# Patient Record
Sex: Female | Born: 1955 | Race: White | Hispanic: No | Marital: Married | Smoking: Former smoker
Health system: Southern US, Community
[De-identification: ages and names within clinical notes are randomized; demographics above are authoritative.]

## PROBLEM LIST (undated history)

## (undated) DIAGNOSIS — I2699 Other pulmonary embolism without acute cor pulmonale: Secondary | ICD-10-CM

## (undated) DIAGNOSIS — I82409 Acute embolism and thrombosis of unspecified deep veins of unspecified lower extremity: Secondary | ICD-10-CM

## (undated) DIAGNOSIS — D509 Iron deficiency anemia, unspecified: Secondary | ICD-10-CM

## (undated) DIAGNOSIS — I10 Essential (primary) hypertension: Secondary | ICD-10-CM

## (undated) DIAGNOSIS — IMO0001 Reserved for inherently not codable concepts without codable children: Secondary | ICD-10-CM

## (undated) DIAGNOSIS — I7410 Embolism and thrombosis of unspecified parts of aorta: Secondary | ICD-10-CM

## (undated) DIAGNOSIS — D688 Other specified coagulation defects: Secondary | ICD-10-CM

## (undated) DIAGNOSIS — E039 Hypothyroidism, unspecified: Secondary | ICD-10-CM

## (undated) DIAGNOSIS — K219 Gastro-esophageal reflux disease without esophagitis: Secondary | ICD-10-CM

## (undated) HISTORY — PX: CHOLECYSTECTOMY: SHX55

## (undated) HISTORY — PX: ABDOMINAL HYSTERECTOMY: SHX81

## (undated) HISTORY — PX: TOTAL ABDOMINAL HYSTERECTOMY: SHX209

---

## 2001-05-04 HISTORY — PX: BELOW KNEE LEG AMPUTATION: SUR23

## 2002-05-10 ENCOUNTER — Emergency Department (HOSPITAL_COMMUNITY): Admission: EM | Admit: 2002-05-10 | Discharge: 2002-05-10 | Payer: Self-pay | Admitting: Emergency Medicine

## 2002-05-12 ENCOUNTER — Encounter: Payer: Self-pay | Admitting: Vascular Surgery

## 2002-05-12 ENCOUNTER — Inpatient Hospital Stay (HOSPITAL_COMMUNITY): Admission: RE | Admit: 2002-05-12 | Discharge: 2002-05-20 | Payer: Self-pay | Admitting: Vascular Surgery

## 2002-05-12 ENCOUNTER — Encounter (INDEPENDENT_AMBULATORY_CARE_PROVIDER_SITE_OTHER): Payer: Self-pay | Admitting: *Deleted

## 2002-05-13 ENCOUNTER — Encounter: Payer: Self-pay | Admitting: Vascular Surgery

## 2002-05-15 ENCOUNTER — Encounter (INDEPENDENT_AMBULATORY_CARE_PROVIDER_SITE_OTHER): Payer: Self-pay | Admitting: Specialist

## 2002-05-25 ENCOUNTER — Encounter: Payer: Self-pay | Admitting: Vascular Surgery

## 2002-05-25 ENCOUNTER — Inpatient Hospital Stay (HOSPITAL_COMMUNITY): Admission: RE | Admit: 2002-05-25 | Discharge: 2002-06-06 | Payer: Self-pay | Admitting: Vascular Surgery

## 2002-05-31 ENCOUNTER — Encounter (INDEPENDENT_AMBULATORY_CARE_PROVIDER_SITE_OTHER): Payer: Self-pay | Admitting: *Deleted

## 2002-09-26 ENCOUNTER — Inpatient Hospital Stay (HOSPITAL_COMMUNITY): Admission: EM | Admit: 2002-09-26 | Discharge: 2002-09-30 | Payer: Self-pay | Admitting: Emergency Medicine

## 2002-09-26 ENCOUNTER — Encounter: Payer: Self-pay | Admitting: Emergency Medicine

## 2002-10-02 ENCOUNTER — Encounter: Admission: RE | Admit: 2002-10-02 | Discharge: 2002-10-02 | Payer: Self-pay | Admitting: Internal Medicine

## 2002-10-05 ENCOUNTER — Encounter: Admission: RE | Admit: 2002-10-05 | Discharge: 2002-10-05 | Payer: Self-pay | Admitting: Internal Medicine

## 2002-10-09 ENCOUNTER — Encounter: Admission: RE | Admit: 2002-10-09 | Discharge: 2002-10-09 | Payer: Self-pay | Admitting: Internal Medicine

## 2002-10-18 ENCOUNTER — Encounter: Admission: RE | Admit: 2002-10-18 | Discharge: 2003-01-16 | Payer: Self-pay | Admitting: Vascular Surgery

## 2003-01-17 ENCOUNTER — Encounter: Admission: RE | Admit: 2003-01-17 | Discharge: 2003-03-12 | Payer: Self-pay | Admitting: Vascular Surgery

## 2003-02-06 ENCOUNTER — Encounter
Admission: RE | Admit: 2003-02-06 | Discharge: 2003-05-07 | Payer: Self-pay | Admitting: Physical Medicine & Rehabilitation

## 2003-05-23 ENCOUNTER — Encounter
Admission: RE | Admit: 2003-05-23 | Discharge: 2003-08-21 | Payer: Self-pay | Admitting: Physical Medicine & Rehabilitation

## 2003-06-26 ENCOUNTER — Encounter
Admission: RE | Admit: 2003-06-26 | Discharge: 2003-09-24 | Payer: Self-pay | Admitting: Physical Medicine & Rehabilitation

## 2003-08-13 ENCOUNTER — Other Ambulatory Visit: Admission: RE | Admit: 2003-08-13 | Discharge: 2003-08-13 | Payer: Self-pay | Admitting: Gynecology

## 2003-08-22 ENCOUNTER — Encounter
Admission: RE | Admit: 2003-08-22 | Discharge: 2003-11-20 | Payer: Self-pay | Admitting: Physical Medicine & Rehabilitation

## 2003-11-15 ENCOUNTER — Encounter
Admission: RE | Admit: 2003-11-15 | Discharge: 2004-02-13 | Payer: Self-pay | Admitting: Physical Medicine & Rehabilitation

## 2004-01-18 ENCOUNTER — Ambulatory Visit (HOSPITAL_COMMUNITY): Admission: RE | Admit: 2004-01-18 | Discharge: 2004-01-18 | Payer: Self-pay | Admitting: Oncology

## 2004-03-13 ENCOUNTER — Ambulatory Visit: Payer: Self-pay | Admitting: Oncology

## 2004-04-24 ENCOUNTER — Encounter
Admission: RE | Admit: 2004-04-24 | Discharge: 2004-07-23 | Payer: Self-pay | Admitting: Physical Medicine & Rehabilitation

## 2004-05-08 ENCOUNTER — Ambulatory Visit: Payer: Self-pay | Admitting: Oncology

## 2004-06-11 ENCOUNTER — Ambulatory Visit: Payer: Self-pay | Admitting: Physical Medicine & Rehabilitation

## 2004-06-26 ENCOUNTER — Ambulatory Visit: Payer: Self-pay | Admitting: Oncology

## 2004-07-03 ENCOUNTER — Other Ambulatory Visit: Admission: RE | Admit: 2004-07-03 | Discharge: 2004-07-03 | Payer: Self-pay | Admitting: Gynecology

## 2004-07-16 ENCOUNTER — Ambulatory Visit (HOSPITAL_COMMUNITY): Admission: RE | Admit: 2004-07-16 | Discharge: 2004-07-16 | Payer: Self-pay | Admitting: Gynecology

## 2004-08-14 ENCOUNTER — Ambulatory Visit: Payer: Self-pay | Admitting: Oncology

## 2004-08-18 ENCOUNTER — Ambulatory Visit: Payer: Self-pay | Admitting: Oncology

## 2004-08-19 ENCOUNTER — Ambulatory Visit (HOSPITAL_COMMUNITY): Admission: RE | Admit: 2004-08-19 | Discharge: 2004-08-20 | Payer: Self-pay | Admitting: *Deleted

## 2004-09-01 ENCOUNTER — Ambulatory Visit: Payer: Self-pay | Admitting: Oncology

## 2004-10-22 ENCOUNTER — Ambulatory Visit: Payer: Self-pay | Admitting: Oncology

## 2004-10-31 ENCOUNTER — Ambulatory Visit: Payer: Self-pay | Admitting: Physical Medicine & Rehabilitation

## 2004-10-31 ENCOUNTER — Encounter
Admission: RE | Admit: 2004-10-31 | Discharge: 2005-01-29 | Payer: Self-pay | Admitting: Physical Medicine & Rehabilitation

## 2004-12-08 ENCOUNTER — Ambulatory Visit: Payer: Self-pay | Admitting: Oncology

## 2005-01-26 ENCOUNTER — Ambulatory Visit: Payer: Self-pay | Admitting: Oncology

## 2005-03-03 ENCOUNTER — Ambulatory Visit: Payer: Self-pay | Admitting: Physical Medicine & Rehabilitation

## 2005-03-03 ENCOUNTER — Encounter
Admission: RE | Admit: 2005-03-03 | Discharge: 2005-06-01 | Payer: Self-pay | Admitting: Physical Medicine & Rehabilitation

## 2005-03-12 ENCOUNTER — Ambulatory Visit: Payer: Self-pay | Admitting: Oncology

## 2005-05-01 ENCOUNTER — Ambulatory Visit: Payer: Self-pay | Admitting: Oncology

## 2005-06-12 ENCOUNTER — Ambulatory Visit: Payer: Self-pay | Admitting: Oncology

## 2005-06-30 ENCOUNTER — Ambulatory Visit: Payer: Self-pay | Admitting: Physical Medicine & Rehabilitation

## 2005-06-30 ENCOUNTER — Encounter
Admission: RE | Admit: 2005-06-30 | Discharge: 2005-09-28 | Payer: Self-pay | Admitting: Physical Medicine & Rehabilitation

## 2005-08-07 ENCOUNTER — Ambulatory Visit: Payer: Self-pay | Admitting: Oncology

## 2005-09-14 LAB — PROTIME-INR (CHCC SATELLITE)
INR: 3 (ref 2.0–3.5)
Protime: 20.8 Seconds — ABNORMAL HIGH (ref 10.6–13.4)

## 2005-10-13 ENCOUNTER — Ambulatory Visit: Payer: Self-pay | Admitting: Gastroenterology

## 2005-10-14 ENCOUNTER — Ambulatory Visit: Payer: Self-pay | Admitting: Oncology

## 2005-10-15 LAB — CBC WITH DIFFERENTIAL (CANCER CENTER ONLY)
Eosinophils Absolute: 0.2 10*3/uL (ref 0.0–0.5)
HCT: 45.3 % (ref 34.8–46.6)
LYMPH#: 2.1 10*3/uL (ref 0.9–3.3)
LYMPH%: 35.4 % (ref 14.0–48.0)
MCV: 98 fL (ref 81–101)
MONO#: 0.3 10*3/uL (ref 0.1–0.9)
Platelets: 223 10*3/uL (ref 145–400)
RBC: 4.62 10*6/uL (ref 3.70–5.32)
WBC: 5.8 10*3/uL (ref 3.9–10.0)

## 2005-10-15 LAB — PROTIME-INR (CHCC SATELLITE)

## 2005-10-16 LAB — BASIC METABOLIC PANEL
BUN: 10 mg/dL (ref 6–23)
CO2: 26 mEq/L (ref 19–32)
Calcium: 9.1 mg/dL (ref 8.4–10.5)
Creatinine, Ser: 0.6 mg/dL (ref 0.40–1.20)
Glucose, Bld: 107 mg/dL — ABNORMAL HIGH (ref 70–99)

## 2005-10-16 LAB — IRON AND TIBC: TIBC: 306 ug/dL (ref 250–470)

## 2005-11-03 ENCOUNTER — Ambulatory Visit: Payer: Self-pay | Admitting: Physical Medicine & Rehabilitation

## 2005-11-03 ENCOUNTER — Encounter
Admission: RE | Admit: 2005-11-03 | Discharge: 2006-02-01 | Payer: Self-pay | Admitting: Physical Medicine & Rehabilitation

## 2005-11-11 ENCOUNTER — Ambulatory Visit: Payer: Self-pay | Admitting: Internal Medicine

## 2005-11-18 ENCOUNTER — Other Ambulatory Visit: Payer: Self-pay

## 2005-11-24 ENCOUNTER — Ambulatory Visit: Payer: Self-pay | Admitting: General Surgery

## 2005-12-02 ENCOUNTER — Ambulatory Visit: Payer: Self-pay | Admitting: Internal Medicine

## 2005-12-18 ENCOUNTER — Ambulatory Visit: Payer: Self-pay | Admitting: Oncology

## 2005-12-29 LAB — PROTIME-INR (CHCC SATELLITE)
INR: 1.9 — ABNORMAL LOW (ref 2.0–3.5)
Protime: 17 Seconds — ABNORMAL HIGH (ref 10.6–13.4)

## 2006-01-02 ENCOUNTER — Ambulatory Visit: Payer: Self-pay | Admitting: Internal Medicine

## 2006-01-06 LAB — PROTIME-INR (CHCC SATELLITE)
INR: 2.8 (ref 2.0–3.5)
Protime: 33.6 Seconds — ABNORMAL HIGH (ref 10.6–13.4)

## 2006-01-20 LAB — PROTIME-INR (CHCC SATELLITE)
INR: 2.8 (ref 2.0–3.5)
Protime: 33.6 Seconds — ABNORMAL HIGH (ref 10.6–13.4)

## 2006-02-10 ENCOUNTER — Ambulatory Visit: Payer: Self-pay | Admitting: Oncology

## 2006-03-09 ENCOUNTER — Encounter
Admission: RE | Admit: 2006-03-09 | Discharge: 2006-06-07 | Payer: Self-pay | Admitting: Physical Medicine & Rehabilitation

## 2006-03-12 ENCOUNTER — Ambulatory Visit: Payer: Self-pay | Admitting: Physical Medicine & Rehabilitation

## 2006-04-15 ENCOUNTER — Ambulatory Visit: Payer: Self-pay | Admitting: Oncology

## 2006-04-20 LAB — PROTIME-INR (CHCC SATELLITE): INR: 3 (ref 2.0–3.5)

## 2006-05-18 LAB — PROTIME-INR (CHCC SATELLITE)
INR: 4.4 — ABNORMAL HIGH (ref 2.0–3.5)
Protime: 52.8 Seconds — ABNORMAL HIGH (ref 10.6–13.4)

## 2006-06-07 ENCOUNTER — Ambulatory Visit: Payer: Self-pay | Admitting: Oncology

## 2006-06-08 LAB — PROTIME-INR (CHCC SATELLITE)

## 2006-06-24 LAB — PROTIME-INR (CHCC SATELLITE)
INR: 4 — ABNORMAL HIGH (ref 2.0–3.5)
Protime: 48 Seconds — ABNORMAL HIGH (ref 10.6–13.4)

## 2006-06-24 LAB — CBC WITH DIFFERENTIAL (CANCER CENTER ONLY)
BASO#: 0.1 10*3/uL (ref 0.0–0.2)
BASO%: 0.9 % (ref 0.0–2.0)
Eosinophils Absolute: 0.2 10*3/uL (ref 0.0–0.5)
HCT: 43.5 % (ref 34.8–46.6)
HGB: 14.9 g/dL (ref 11.6–15.9)
LYMPH#: 2 10*3/uL (ref 0.9–3.3)
LYMPH%: 34.8 % (ref 14.0–48.0)
MCV: 98 fL (ref 81–101)
MONO#: 0.3 10*3/uL (ref 0.1–0.9)
NEUT%: 54.6 % (ref 39.6–80.0)
RDW: 11.1 % (ref 10.5–14.6)
WBC: 5.7 10*3/uL (ref 3.9–10.0)

## 2006-06-24 LAB — FERRITIN: Ferritin: 410 ng/mL — ABNORMAL HIGH (ref 10–291)

## 2006-06-24 LAB — IRON AND TIBC
%SAT: 48 % (ref 20–55)
TIBC: 344 ug/dL (ref 250–470)

## 2006-07-01 LAB — PROTIME-INR (CHCC SATELLITE)
INR: 3.3 (ref 2.0–3.5)
Protime: 39.6 Seconds — ABNORMAL HIGH (ref 10.6–13.4)

## 2006-07-08 ENCOUNTER — Ambulatory Visit: Payer: Self-pay | Admitting: Physical Medicine & Rehabilitation

## 2006-07-08 ENCOUNTER — Encounter
Admission: RE | Admit: 2006-07-08 | Discharge: 2006-10-06 | Payer: Self-pay | Admitting: Physical Medicine & Rehabilitation

## 2006-07-15 LAB — PROTIME-INR (CHCC SATELLITE)

## 2006-07-28 ENCOUNTER — Ambulatory Visit: Payer: Self-pay | Admitting: Oncology

## 2006-08-31 LAB — PROTIME-INR (CHCC SATELLITE)
INR: 1.6 — ABNORMAL LOW (ref 2.0–3.5)
Protime: 19.2 Seconds — ABNORMAL HIGH (ref 10.6–13.4)

## 2006-09-08 LAB — PROTIME-INR (CHCC SATELLITE)
INR: 2.2 (ref 2.0–3.5)
Protime: 26.4 Seconds — ABNORMAL HIGH (ref 10.6–13.4)

## 2006-09-22 ENCOUNTER — Ambulatory Visit: Payer: Self-pay | Admitting: Oncology

## 2006-10-07 LAB — PROTIME-INR (CHCC SATELLITE)

## 2006-10-28 LAB — PROTIME-INR (CHCC SATELLITE)
INR: 2.8 (ref 2.0–3.5)
Protime: 33.6 Seconds — ABNORMAL HIGH (ref 10.6–13.4)

## 2006-11-01 ENCOUNTER — Encounter
Admission: RE | Admit: 2006-11-01 | Discharge: 2007-01-30 | Payer: Self-pay | Admitting: Physical Medicine & Rehabilitation

## 2006-11-01 ENCOUNTER — Ambulatory Visit: Payer: Self-pay | Admitting: Physical Medicine & Rehabilitation

## 2006-11-23 ENCOUNTER — Ambulatory Visit: Payer: Self-pay | Admitting: Oncology

## 2006-11-30 LAB — PROTIME-INR (CHCC SATELLITE): Protime: 26.4 Seconds — ABNORMAL HIGH (ref 10.6–13.4)

## 2006-12-23 LAB — CBC WITH DIFFERENTIAL (CANCER CENTER ONLY)
BASO#: 0.1 10*3/uL (ref 0.0–0.2)
BASO%: 0.8 % (ref 0.0–2.0)
EOS%: 5.8 % (ref 0.0–7.0)
HCT: 39.8 % (ref 34.8–46.6)
HGB: 13.4 g/dL (ref 11.6–15.9)
LYMPH%: 31.4 % (ref 14.0–48.0)
MCH: 32.3 pg (ref 26.0–34.0)
MCHC: 33.7 g/dL (ref 32.0–36.0)
MCV: 96 fL (ref 81–101)
MONO%: 4.9 % (ref 0.0–13.0)
NEUT%: 57.1 % (ref 39.6–80.0)
RDW: 10.5 % (ref 10.5–14.6)

## 2006-12-23 LAB — COMPREHENSIVE METABOLIC PANEL
ALT: 21 U/L (ref 0–35)
Albumin: 4 g/dL (ref 3.5–5.2)
CO2: 27 mEq/L (ref 19–32)
Glucose, Bld: 92 mg/dL (ref 70–99)
Potassium: 3.6 mEq/L (ref 3.5–5.3)
Sodium: 133 mEq/L — ABNORMAL LOW (ref 135–145)
Total Bilirubin: 0.6 mg/dL (ref 0.3–1.2)
Total Protein: 6.2 g/dL (ref 6.0–8.3)

## 2007-01-21 ENCOUNTER — Ambulatory Visit: Payer: Self-pay | Admitting: Oncology

## 2007-01-28 LAB — PROTIME-INR (CHCC SATELLITE)
INR: 2.3 (ref 2.0–3.5)
Protime: 27.6 Seconds — ABNORMAL HIGH (ref 10.6–13.4)

## 2007-02-17 LAB — PROTIME-INR (CHCC SATELLITE): INR: 2.4 (ref 2.0–3.5)

## 2007-03-10 ENCOUNTER — Ambulatory Visit: Payer: Self-pay | Admitting: Physical Medicine & Rehabilitation

## 2007-03-10 ENCOUNTER — Encounter
Admission: RE | Admit: 2007-03-10 | Discharge: 2007-03-11 | Payer: Self-pay | Admitting: Physical Medicine & Rehabilitation

## 2007-03-16 ENCOUNTER — Ambulatory Visit: Payer: Self-pay | Admitting: Oncology

## 2007-03-21 LAB — PROTIME-INR (CHCC SATELLITE): INR: 2.7 (ref 2.0–3.5)

## 2007-04-14 LAB — PROTIME-INR (CHCC SATELLITE): Protime: 43.2 Seconds — ABNORMAL HIGH (ref 10.6–13.4)

## 2007-04-21 LAB — PROTIME-INR (CHCC SATELLITE): Protime: 32.4 Seconds — ABNORMAL HIGH (ref 10.6–13.4)

## 2007-05-10 ENCOUNTER — Ambulatory Visit: Payer: Self-pay | Admitting: Oncology

## 2007-05-12 LAB — PROTIME-INR (CHCC SATELLITE)
INR: 2.4 (ref 2.0–3.5)
Protime: 28.8 Seconds — ABNORMAL HIGH (ref 10.6–13.4)

## 2007-06-27 ENCOUNTER — Ambulatory Visit: Payer: Self-pay | Admitting: Oncology

## 2007-06-28 LAB — CBC WITH DIFFERENTIAL (CANCER CENTER ONLY)
BASO%: 0.9 % (ref 0.0–2.0)
EOS%: 4.1 % (ref 0.0–7.0)
HCT: 45.6 % (ref 34.8–46.6)
LYMPH#: 2.5 10*3/uL (ref 0.9–3.3)
LYMPH%: 36.2 % (ref 14.0–48.0)
MCHC: 34 g/dL (ref 32.0–36.0)
MCV: 98 fL (ref 81–101)
NEUT%: 53.6 % (ref 39.6–80.0)
Platelets: 265 10*3/uL (ref 145–400)
RDW: 10.2 % — ABNORMAL LOW (ref 10.5–14.6)

## 2007-06-28 LAB — FERRITIN: Ferritin: 130 ng/mL (ref 10–291)

## 2007-06-28 LAB — IRON AND TIBC: %SAT: 49 % (ref 20–55)

## 2007-06-28 LAB — COMPREHENSIVE METABOLIC PANEL
ALT: 32 U/L (ref 0–35)
AST: 27 U/L (ref 0–37)
Chloride: 101 mEq/L (ref 96–112)
Creatinine, Ser: 0.68 mg/dL (ref 0.40–1.20)
Total Bilirubin: 0.8 mg/dL (ref 0.3–1.2)

## 2007-06-28 LAB — PROTIME-INR (CHCC SATELLITE): INR: 1.9 — ABNORMAL LOW (ref 2.0–3.5)

## 2007-06-30 ENCOUNTER — Ambulatory Visit: Payer: Self-pay | Admitting: Physical Medicine & Rehabilitation

## 2007-06-30 ENCOUNTER — Encounter
Admission: RE | Admit: 2007-06-30 | Discharge: 2007-09-28 | Payer: Self-pay | Admitting: Physical Medicine & Rehabilitation

## 2007-07-12 LAB — PROTIME-INR (CHCC SATELLITE): INR: 2.5 (ref 2.0–3.5)

## 2007-07-12 IMAGING — CR DG CHOLANGIOGRAM OPERATIVE
1 series · 2 of 2 positions shown · non-contrast
Comparison: none

REASON FOR EXAM: Cholangiogram in OR
COMMENTS:

[Series 1: view not recorded · 0.17mm/px · 2 of 2 slices shown]
[im 1/2]
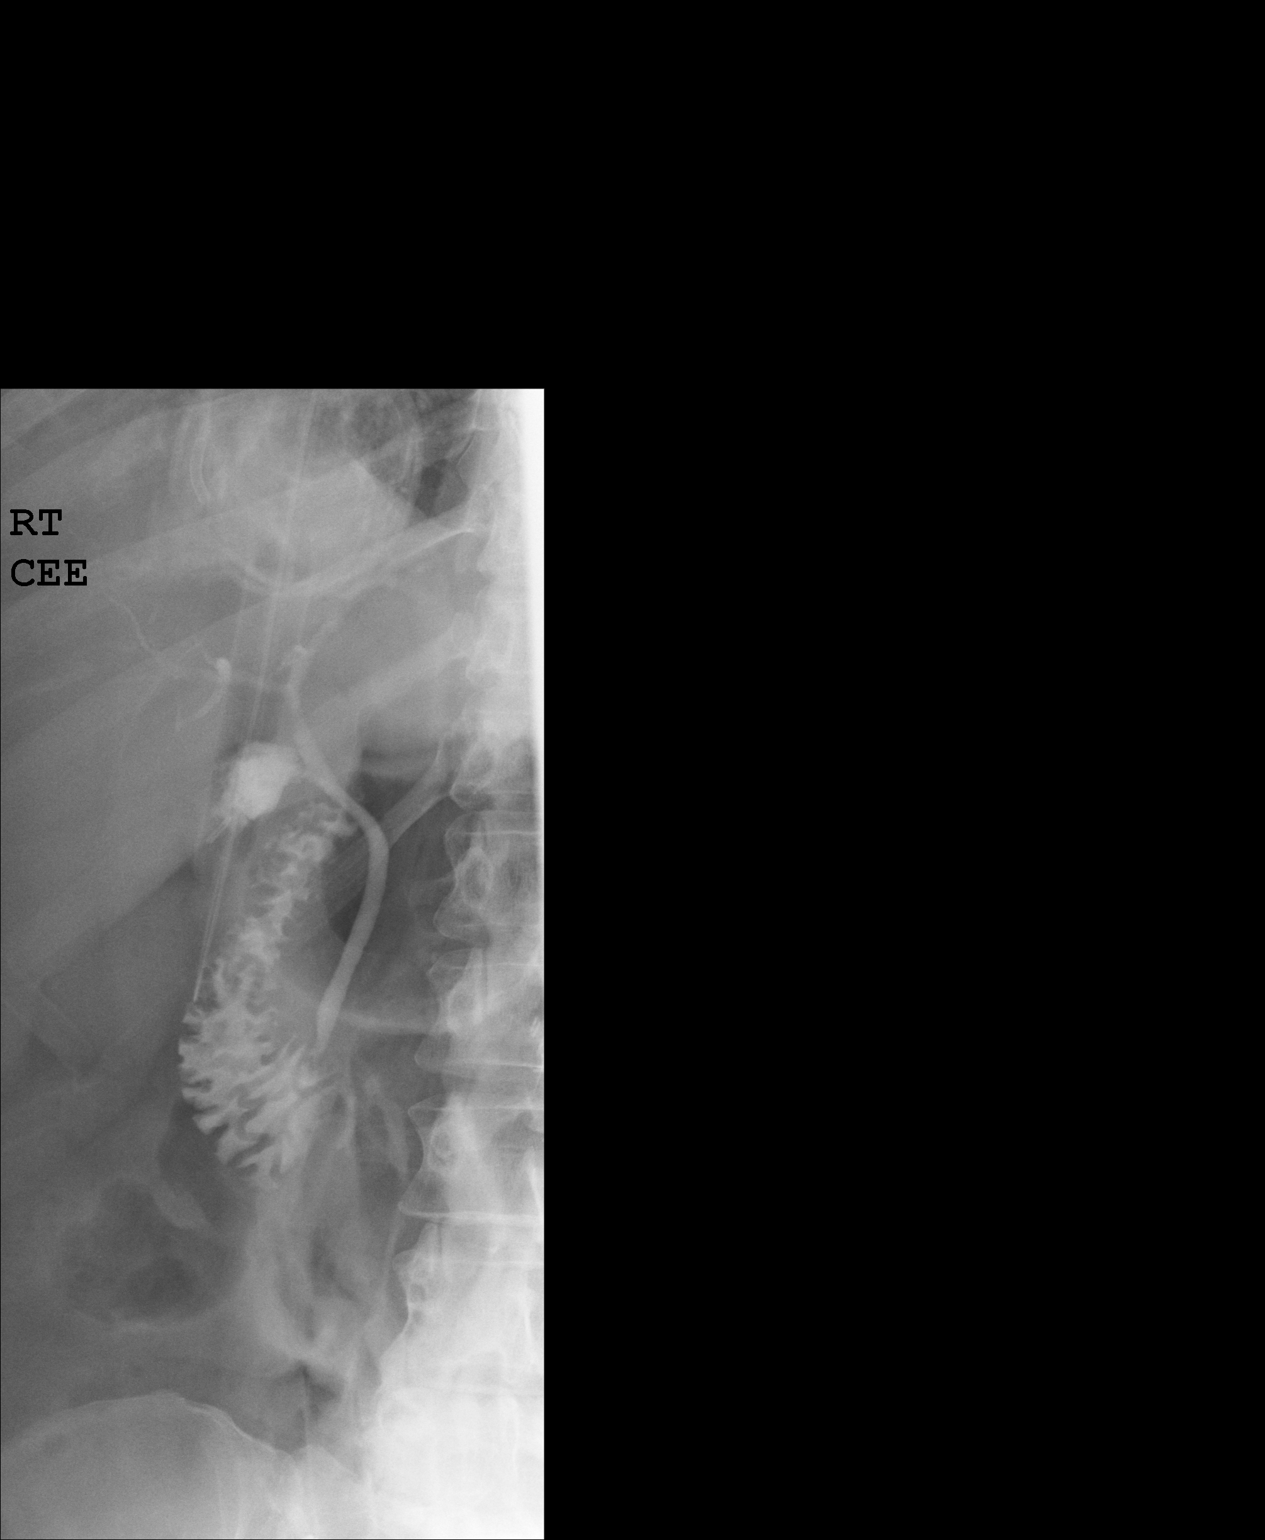
[im 2/2]
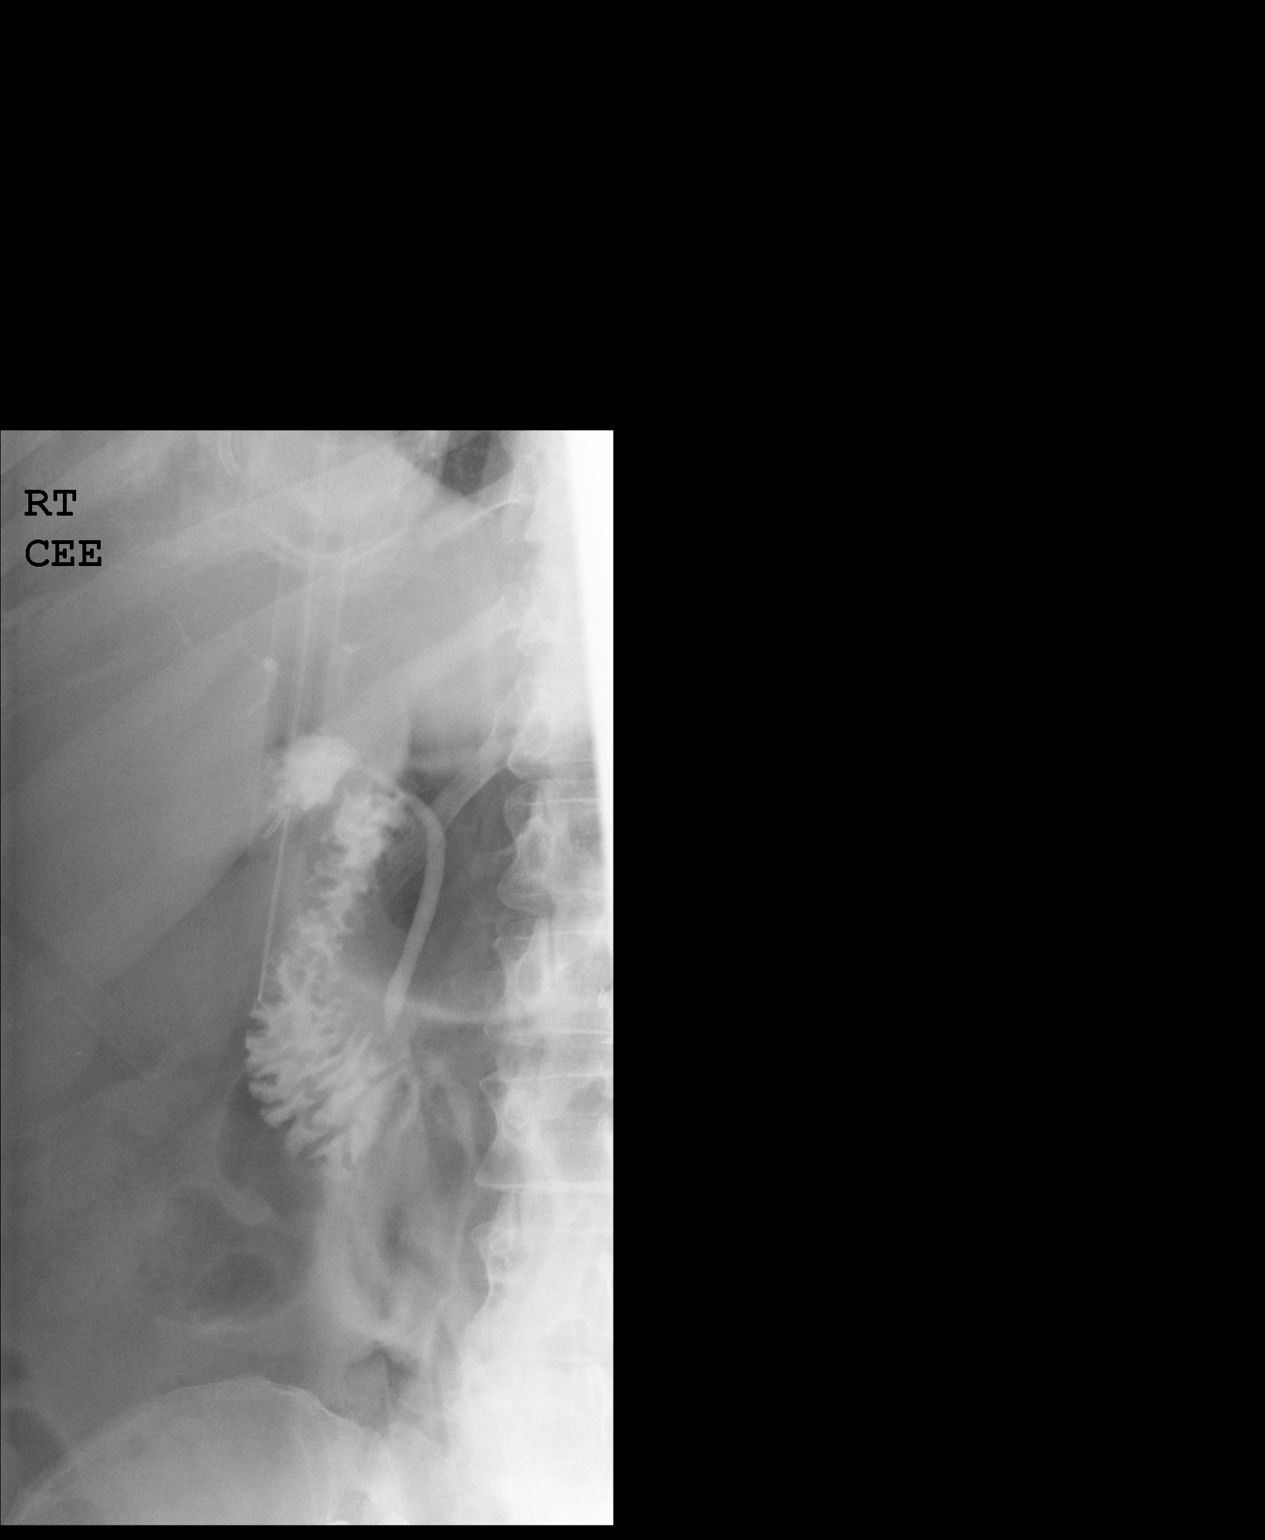

[2 of 2 positions shown; findings below may reference images not displayed]

PROCEDURE:     DXR - DXR CHOLANGIOGRAM OP (INITIAL)  - November 24, 2005 [DATE]

RESULT:     Contrast material was visualized in the hepatic ducts and common
duct.  The common duct is normal in size.  No retained stone is seen.
Contrast is noted to flow into the duodenum without evidence of obstruction.
IMPRESSION: Normal operative cholangiogram.

## 2007-08-09 LAB — PROTIME-INR (CHCC SATELLITE): Protime: 33.6 Seconds — ABNORMAL HIGH (ref 10.6–13.4)

## 2007-08-15 ENCOUNTER — Ambulatory Visit: Payer: Self-pay | Admitting: Gastroenterology

## 2007-08-31 ENCOUNTER — Ambulatory Visit: Payer: Self-pay | Admitting: Physical Medicine & Rehabilitation

## 2007-09-05 ENCOUNTER — Ambulatory Visit: Payer: Self-pay | Admitting: Oncology

## 2007-09-06 LAB — PROTIME-INR (CHCC SATELLITE)
INR: 3.8 — ABNORMAL HIGH (ref 2.0–3.5)
Protime: 45.6 Seconds — ABNORMAL HIGH (ref 10.6–13.4)

## 2007-10-12 LAB — PROTIME-INR (CHCC SATELLITE): Protime: 36 Seconds — ABNORMAL HIGH (ref 10.6–13.4)

## 2007-10-28 ENCOUNTER — Ambulatory Visit: Payer: Self-pay | Admitting: Oncology

## 2007-11-08 LAB — PROTIME-INR (CHCC SATELLITE): INR: 1.5 — ABNORMAL LOW (ref 2.0–3.5)

## 2007-11-09 ENCOUNTER — Encounter
Admission: RE | Admit: 2007-11-09 | Discharge: 2007-11-10 | Payer: Self-pay | Admitting: Physical Medicine & Rehabilitation

## 2007-11-10 ENCOUNTER — Ambulatory Visit: Payer: Self-pay | Admitting: Physical Medicine & Rehabilitation

## 2007-11-22 LAB — PROTIME-INR (CHCC SATELLITE)
INR: 2.1 (ref 2.0–3.5)
Protime: 25.2 Seconds — ABNORMAL HIGH (ref 10.6–13.4)

## 2007-12-26 ENCOUNTER — Ambulatory Visit: Payer: Self-pay | Admitting: Oncology

## 2007-12-27 LAB — CBC WITH DIFFERENTIAL (CANCER CENTER ONLY)
BASO%: 0.6 % (ref 0.0–2.0)
HCT: 39.8 % (ref 34.8–46.6)
LYMPH%: 35.5 % (ref 14.0–48.0)
MCH: 34.3 pg — ABNORMAL HIGH (ref 26.0–34.0)
MCV: 98 fL (ref 81–101)
MONO#: 0.3 10*3/uL (ref 0.1–0.9)
MONO%: 5.3 % (ref 0.0–13.0)
NEUT%: 54 % (ref 39.6–80.0)
RDW: 9.9 % — ABNORMAL LOW (ref 10.5–14.6)
WBC: 6.4 10*3/uL (ref 3.9–10.0)

## 2007-12-27 LAB — IRON AND TIBC: %SAT: 29 % (ref 20–55)

## 2007-12-27 LAB — PROTIME-INR (CHCC SATELLITE)
INR: 2.6 (ref 2.0–3.5)
Protime: 31.2 Seconds — ABNORMAL HIGH (ref 10.6–13.4)

## 2008-01-27 LAB — PROTIME-INR (CHCC SATELLITE): INR: 3.5 (ref 2.0–3.5)

## 2008-02-09 ENCOUNTER — Ambulatory Visit: Payer: Self-pay | Admitting: Oncology

## 2008-02-13 LAB — PROTIME-INR (CHCC SATELLITE): Protime: 28.8 Seconds — ABNORMAL HIGH (ref 10.6–13.4)

## 2008-02-23 ENCOUNTER — Encounter
Admission: RE | Admit: 2008-02-23 | Discharge: 2008-03-09 | Payer: Self-pay | Admitting: Physical Medicine & Rehabilitation

## 2008-03-09 ENCOUNTER — Encounter
Admission: RE | Admit: 2008-03-09 | Discharge: 2008-03-19 | Payer: Self-pay | Admitting: Physical Medicine and Rehabilitation

## 2008-03-19 ENCOUNTER — Ambulatory Visit: Payer: Self-pay | Admitting: Physical Medicine & Rehabilitation

## 2008-03-23 LAB — PROTIME-INR (CHCC SATELLITE): Protime: 31.2 Seconds — ABNORMAL HIGH (ref 10.6–13.4)

## 2008-04-19 ENCOUNTER — Ambulatory Visit: Payer: Self-pay | Admitting: Oncology

## 2008-04-24 LAB — PROTIME-INR (CHCC SATELLITE): INR: 2.7 (ref 2.0–3.5)

## 2008-06-14 ENCOUNTER — Ambulatory Visit: Payer: Self-pay | Admitting: Oncology

## 2008-06-15 LAB — PROTIME-INR (CHCC SATELLITE): INR: 2.5 (ref 2.0–3.5)

## 2008-07-12 ENCOUNTER — Encounter
Admission: RE | Admit: 2008-07-12 | Discharge: 2008-10-10 | Payer: Self-pay | Admitting: Physical Medicine and Rehabilitation

## 2008-07-13 ENCOUNTER — Ambulatory Visit: Payer: Self-pay | Admitting: Physical Medicine and Rehabilitation

## 2008-07-13 LAB — PROTIME-INR (CHCC SATELLITE)
INR: 1.5 — ABNORMAL LOW (ref 2.0–3.5)
Protime: 18 Seconds — ABNORMAL HIGH (ref 10.6–13.4)

## 2008-07-27 ENCOUNTER — Encounter: Payer: Self-pay | Admitting: Gastroenterology

## 2008-07-31 ENCOUNTER — Ambulatory Visit: Payer: Self-pay | Admitting: Oncology

## 2008-07-31 DIAGNOSIS — I743 Embolism and thrombosis of arteries of the lower extremities: Secondary | ICD-10-CM | POA: Insufficient documentation

## 2008-07-31 DIAGNOSIS — F419 Anxiety disorder, unspecified: Secondary | ICD-10-CM | POA: Insufficient documentation

## 2008-07-31 DIAGNOSIS — R894 Abnormal immunological findings in specimens from other organs, systems and tissues: Secondary | ICD-10-CM | POA: Insufficient documentation

## 2008-07-31 DIAGNOSIS — K219 Gastro-esophageal reflux disease without esophagitis: Secondary | ICD-10-CM

## 2008-07-31 DIAGNOSIS — F411 Generalized anxiety disorder: Secondary | ICD-10-CM | POA: Insufficient documentation

## 2008-07-31 DIAGNOSIS — J019 Acute sinusitis, unspecified: Secondary | ICD-10-CM | POA: Insufficient documentation

## 2008-07-31 LAB — PROTIME-INR (CHCC SATELLITE)
INR: 1.3 — ABNORMAL LOW (ref 2.0–3.5)
Protime: 15.6 Seconds — ABNORMAL HIGH (ref 10.6–13.4)

## 2008-08-07 LAB — PROTIME-INR (CHCC SATELLITE)

## 2008-08-20 ENCOUNTER — Ambulatory Visit: Payer: Self-pay | Admitting: Physical Medicine and Rehabilitation

## 2008-08-22 ENCOUNTER — Encounter
Admission: RE | Admit: 2008-08-22 | Discharge: 2008-11-20 | Payer: Self-pay | Admitting: Physical Medicine and Rehabilitation

## 2008-09-07 LAB — PROTIME-INR (CHCC SATELLITE): INR: 2.9 (ref 2.0–3.5)

## 2008-09-24 ENCOUNTER — Ambulatory Visit: Payer: Self-pay | Admitting: Oncology

## 2008-09-24 ENCOUNTER — Ambulatory Visit: Payer: Self-pay | Admitting: Physical Medicine and Rehabilitation

## 2008-09-25 LAB — CBC WITH DIFFERENTIAL (CANCER CENTER ONLY)
BASO#: 0.1 10*3/uL (ref 0.0–0.2)
Eosinophils Absolute: 0.3 10*3/uL (ref 0.0–0.5)
HCT: 40 % (ref 34.8–46.6)
HGB: 14 g/dL (ref 11.6–15.9)
LYMPH%: 27.6 % (ref 14.0–48.0)
MCH: 31.6 pg (ref 26.0–34.0)
MCV: 91 fL (ref 81–101)
MONO#: 0.5 10*3/uL (ref 0.1–0.9)
MONO%: 5.1 % (ref 0.0–13.0)
NEUT%: 62.9 % (ref 39.6–80.0)
Platelets: 282 10*3/uL (ref 145–400)
RBC: 4.41 10*6/uL (ref 3.70–5.32)
WBC: 9.3 10*3/uL (ref 3.9–10.0)

## 2008-10-16 ENCOUNTER — Encounter
Admission: RE | Admit: 2008-10-16 | Discharge: 2009-01-09 | Payer: Self-pay | Admitting: Physical Medicine and Rehabilitation

## 2008-10-23 ENCOUNTER — Ambulatory Visit: Payer: Self-pay | Admitting: Physical Medicine and Rehabilitation

## 2008-10-24 LAB — PROTIME-INR (CHCC SATELLITE)
INR: 2.5 (ref 2.0–3.5)
Protime: 30 Seconds — ABNORMAL HIGH (ref 10.6–13.4)

## 2008-11-16 ENCOUNTER — Ambulatory Visit: Payer: Self-pay | Admitting: Oncology

## 2008-11-20 LAB — PROTIME-INR (CHCC SATELLITE): Protime: 26.4 Seconds — ABNORMAL HIGH (ref 10.6–13.4)

## 2008-11-23 ENCOUNTER — Ambulatory Visit: Payer: Self-pay | Admitting: Physical Medicine and Rehabilitation

## 2008-12-13 ENCOUNTER — Ambulatory Visit: Payer: Self-pay | Admitting: Oncology

## 2008-12-21 ENCOUNTER — Ambulatory Visit: Payer: Self-pay | Admitting: Physical Medicine and Rehabilitation

## 2008-12-24 LAB — PROTIME-INR (CHCC SATELLITE): Protime: 46.8 Seconds — ABNORMAL HIGH (ref 10.6–13.4)

## 2008-12-31 LAB — PROTIME-INR (CHCC SATELLITE)
INR: 2 (ref 2.0–3.5)
Protime: 24 Seconds — ABNORMAL HIGH (ref 10.6–13.4)

## 2009-01-09 ENCOUNTER — Ambulatory Visit: Payer: Self-pay | Admitting: Oncology

## 2009-01-09 ENCOUNTER — Encounter
Admission: RE | Admit: 2009-01-09 | Discharge: 2009-01-16 | Payer: Self-pay | Admitting: Physical Medicine and Rehabilitation

## 2009-01-16 ENCOUNTER — Ambulatory Visit: Payer: Self-pay | Admitting: Physical Medicine and Rehabilitation

## 2009-01-21 LAB — PROTIME-INR (CHCC SATELLITE)

## 2009-02-04 LAB — PROTIME-INR (CHCC SATELLITE)
INR: 1.8 — ABNORMAL LOW (ref 2.0–3.5)
Protime: 21.6 Seconds — ABNORMAL HIGH (ref 10.6–13.4)

## 2009-02-11 ENCOUNTER — Ambulatory Visit: Payer: Self-pay | Admitting: Oncology

## 2009-02-13 LAB — PROTIME-INR (CHCC SATELLITE): INR: 3.2 (ref 2.0–3.5)

## 2009-03-07 ENCOUNTER — Encounter
Admission: RE | Admit: 2009-03-07 | Discharge: 2009-05-02 | Payer: Self-pay | Admitting: Physical Medicine and Rehabilitation

## 2009-03-08 ENCOUNTER — Ambulatory Visit: Payer: Self-pay | Admitting: Physical Medicine and Rehabilitation

## 2009-03-12 LAB — PROTIME-INR (CHCC SATELLITE): Protime: 30 Seconds — ABNORMAL HIGH (ref 10.6–13.4)

## 2009-03-25 ENCOUNTER — Ambulatory Visit: Payer: Self-pay | Admitting: Oncology

## 2009-04-05 ENCOUNTER — Ambulatory Visit: Payer: Self-pay | Admitting: Physical Medicine and Rehabilitation

## 2009-04-18 ENCOUNTER — Ambulatory Visit: Payer: Self-pay | Admitting: Oncology

## 2009-04-23 LAB — CBC WITH DIFFERENTIAL (CANCER CENTER ONLY)
BASO#: 0.2 10*3/uL (ref 0.0–0.2)
Eosinophils Absolute: 0.3 10*3/uL (ref 0.0–0.5)
HGB: 15.6 g/dL (ref 11.6–15.9)
LYMPH#: 3.2 10*3/uL (ref 0.9–3.3)
MONO#: 0.6 10*3/uL (ref 0.1–0.9)
NEUT#: 4.5 10*3/uL (ref 1.5–6.5)
Platelets: 210 10*3/uL (ref 145–400)
RBC: 4.67 10*6/uL (ref 3.70–5.32)
WBC: 8.8 10*3/uL (ref 3.9–10.0)

## 2009-05-02 ENCOUNTER — Ambulatory Visit: Payer: Self-pay | Admitting: Physical Medicine and Rehabilitation

## 2009-06-21 ENCOUNTER — Encounter
Admission: RE | Admit: 2009-06-21 | Discharge: 2009-09-12 | Payer: Self-pay | Admitting: Physical Medicine and Rehabilitation

## 2009-06-21 ENCOUNTER — Ambulatory Visit: Payer: Self-pay | Admitting: Oncology

## 2009-06-25 LAB — PROTIME-INR (CHCC SATELLITE): INR: 1.5 — ABNORMAL LOW (ref 2.0–3.5)

## 2009-06-26 ENCOUNTER — Ambulatory Visit: Payer: Self-pay | Admitting: Physical Medicine and Rehabilitation

## 2009-07-03 LAB — PROTIME-INR (CHCC SATELLITE): INR: 2.8 (ref 2.0–3.5)

## 2009-07-10 LAB — PROTIME-INR (CHCC SATELLITE)

## 2009-07-18 ENCOUNTER — Ambulatory Visit: Payer: Self-pay | Admitting: Oncology

## 2009-07-18 LAB — PROTIME-INR (CHCC SATELLITE): INR: 3.3 (ref 2.0–3.5)

## 2009-07-24 ENCOUNTER — Ambulatory Visit: Payer: Self-pay | Admitting: Physical Medicine and Rehabilitation

## 2009-07-26 LAB — PROTIME-INR (CHCC SATELLITE)

## 2009-07-29 LAB — PROTIME-INR (CHCC SATELLITE)
INR: 2.1 (ref 2.0–3.5)
Protime: 25.2 Seconds — ABNORMAL HIGH (ref 10.6–13.4)

## 2009-08-02 LAB — PROTIME-INR (CHCC SATELLITE): INR: 1.5 — ABNORMAL LOW (ref 2.0–3.5)

## 2009-08-09 LAB — PROTIME-INR (CHCC SATELLITE)
INR: 1.7 — ABNORMAL LOW (ref 2.0–3.5)
Protime: 20.4 Seconds — ABNORMAL HIGH (ref 10.6–13.4)

## 2009-08-16 LAB — PROTIME-INR (CHCC SATELLITE): Protime: 27.6 Seconds — ABNORMAL HIGH (ref 10.6–13.4)

## 2009-08-19 ENCOUNTER — Ambulatory Visit: Payer: Self-pay | Admitting: Oncology

## 2009-08-23 LAB — PROTIME-INR (CHCC SATELLITE): Protime: 27.6 Seconds — ABNORMAL HIGH (ref 10.6–13.4)

## 2009-08-30 LAB — PROTIME-INR (CHCC SATELLITE)
INR: 2.5 (ref 2.0–3.5)
Protime: 30 Seconds — ABNORMAL HIGH (ref 10.6–13.4)

## 2009-09-06 LAB — PROTIME-INR (CHCC SATELLITE)
INR: 1.6 — ABNORMAL LOW (ref 2.0–3.5)
Protime: 19.2 Seconds — ABNORMAL HIGH (ref 10.6–13.4)

## 2009-09-12 ENCOUNTER — Encounter
Admission: RE | Admit: 2009-09-12 | Discharge: 2009-12-02 | Payer: Self-pay | Admitting: Physical Medicine and Rehabilitation

## 2009-09-16 ENCOUNTER — Ambulatory Visit: Payer: Self-pay | Admitting: Oncology

## 2009-09-16 ENCOUNTER — Ambulatory Visit: Payer: Self-pay | Admitting: Physical Medicine and Rehabilitation

## 2009-10-16 ENCOUNTER — Ambulatory Visit: Payer: Self-pay | Admitting: Oncology

## 2009-10-16 ENCOUNTER — Ambulatory Visit: Payer: Self-pay | Admitting: Physical Medicine and Rehabilitation

## 2009-10-18 LAB — PROTIME-INR (CHCC SATELLITE)
INR: 3.1 (ref 2.0–3.5)
Protime: 37.2 Seconds — ABNORMAL HIGH (ref 10.6–13.4)

## 2009-11-19 ENCOUNTER — Ambulatory Visit: Payer: Self-pay | Admitting: Oncology

## 2009-11-29 LAB — IRON AND TIBC

## 2009-11-29 LAB — CBC WITH DIFFERENTIAL (CANCER CENTER ONLY)
BASO%: 1.6 % (ref 0.0–2.0)
Eosinophils Absolute: 0.2 10*3/uL (ref 0.0–0.5)
HGB: 17.8 g/dL — ABNORMAL HIGH (ref 11.6–15.9)
LYMPH#: 2 10*3/uL (ref 0.9–3.3)
LYMPH%: 28.6 % (ref 14.0–48.0)
MCHC: 35.5 g/dL (ref 32.0–36.0)
MCV: 100 fL (ref 81–101)
MONO%: 5.2 % (ref 0.0–13.0)
NEUT%: 61.3 % (ref 39.6–80.0)
Platelets: 294 10*3/uL (ref 145–400)
RBC: 5.02 10*6/uL (ref 3.70–5.32)

## 2009-11-29 LAB — CMP (CANCER CENTER ONLY)
ALT(SGPT): 34 U/L (ref 10–47)
Alkaline Phosphatase: 40 U/L (ref 26–84)
Potassium: 3.4 mEq/L (ref 3.3–4.7)
Sodium: 138 mEq/L (ref 128–145)
Total Protein: 7 g/dL (ref 6.4–8.1)

## 2009-11-29 LAB — PROTIME-INR (CHCC SATELLITE): Protime: 38.4 Seconds — ABNORMAL HIGH (ref 10.6–13.4)

## 2009-11-29 LAB — FERRITIN: Ferritin: 145 ng/mL (ref 10–291)

## 2009-12-02 ENCOUNTER — Ambulatory Visit: Payer: Self-pay | Admitting: Physical Medicine and Rehabilitation

## 2009-12-19 ENCOUNTER — Ambulatory Visit: Payer: Self-pay | Admitting: Oncology

## 2009-12-20 LAB — PROTIME-INR (CHCC SATELLITE)
INR: 4.3 — ABNORMAL HIGH (ref 2.0–3.5)
Protime: 51.6 Seconds — ABNORMAL HIGH (ref 10.6–13.4)

## 2009-12-25 ENCOUNTER — Encounter
Admission: RE | Admit: 2009-12-25 | Discharge: 2010-03-25 | Payer: Self-pay | Admitting: Physical Medicine and Rehabilitation

## 2009-12-31 LAB — PROTIME-INR (CHCC SATELLITE): Protime: 24 Seconds — ABNORMAL HIGH (ref 10.6–13.4)

## 2010-01-01 ENCOUNTER — Ambulatory Visit: Payer: Self-pay | Admitting: Physical Medicine and Rehabilitation

## 2010-01-07 LAB — PROTIME-INR (CHCC SATELLITE)
INR: 2.6 (ref 2.0–3.5)
Protime: 31.2 Seconds — ABNORMAL HIGH (ref 10.6–13.4)

## 2010-01-22 ENCOUNTER — Ambulatory Visit: Payer: Self-pay | Admitting: Oncology

## 2010-01-28 LAB — PROTIME-INR (CHCC SATELLITE)
INR: 2.1 (ref 2.0–3.5)
Protime: 25.2 Seconds — ABNORMAL HIGH (ref 10.6–13.4)

## 2010-01-29 ENCOUNTER — Ambulatory Visit: Payer: Self-pay | Admitting: Physical Medicine and Rehabilitation

## 2010-02-20 ENCOUNTER — Ambulatory Visit: Payer: Self-pay | Admitting: Oncology

## 2010-02-26 ENCOUNTER — Ambulatory Visit: Payer: Self-pay | Admitting: Physical Medicine and Rehabilitation

## 2010-02-27 LAB — PROTIME-INR (CHCC SATELLITE)
INR: 2 (ref 2.0–3.5)
Protime: 24 Seconds — ABNORMAL HIGH (ref 10.6–13.4)

## 2010-03-26 ENCOUNTER — Ambulatory Visit: Payer: Self-pay | Admitting: Oncology

## 2010-03-26 ENCOUNTER — Encounter
Admission: RE | Admit: 2010-03-26 | Discharge: 2010-04-16 | Payer: Self-pay | Source: Home / Self Care | Attending: Physical Medicine and Rehabilitation | Admitting: Physical Medicine and Rehabilitation

## 2010-03-26 ENCOUNTER — Ambulatory Visit: Payer: Self-pay | Admitting: Physical Medicine and Rehabilitation

## 2010-03-26 LAB — PROTIME-INR

## 2010-04-16 ENCOUNTER — Ambulatory Visit: Payer: Self-pay | Admitting: Physical Medicine and Rehabilitation

## 2010-04-30 ENCOUNTER — Ambulatory Visit: Payer: Self-pay | Admitting: Oncology

## 2010-05-19 ENCOUNTER — Encounter
Admission: RE | Admit: 2010-05-19 | Discharge: 2010-05-20 | Payer: Self-pay | Source: Home / Self Care | Attending: Physical Medicine and Rehabilitation | Admitting: Physical Medicine and Rehabilitation

## 2010-05-20 ENCOUNTER — Ambulatory Visit
Admission: RE | Admit: 2010-05-20 | Discharge: 2010-05-20 | Payer: Self-pay | Source: Home / Self Care | Attending: Physical Medicine and Rehabilitation | Admitting: Physical Medicine and Rehabilitation

## 2010-05-20 LAB — PROTIME-INR
INR: 2.7 (ref 2.00–3.50)
Protime: 32.4 Seconds — ABNORMAL HIGH (ref 10.6–13.4)

## 2010-05-25 ENCOUNTER — Encounter: Payer: Self-pay | Admitting: Gynecology

## 2010-06-17 ENCOUNTER — Ambulatory Visit: Payer: Self-pay

## 2010-07-25 ENCOUNTER — Encounter: Payer: MEDICARE | Attending: Physical Medicine and Rehabilitation

## 2010-07-25 ENCOUNTER — Ambulatory Visit (HOSPITAL_BASED_OUTPATIENT_CLINIC_OR_DEPARTMENT_OTHER): Payer: MEDICARE | Admitting: Physical Medicine and Rehabilitation

## 2010-07-25 DIAGNOSIS — G894 Chronic pain syndrome: Secondary | ICD-10-CM

## 2010-07-25 DIAGNOSIS — M545 Low back pain, unspecified: Secondary | ICD-10-CM | POA: Insufficient documentation

## 2010-07-25 DIAGNOSIS — M79609 Pain in unspecified limb: Secondary | ICD-10-CM | POA: Insufficient documentation

## 2010-07-25 DIAGNOSIS — M542 Cervicalgia: Secondary | ICD-10-CM | POA: Insufficient documentation

## 2010-07-25 DIAGNOSIS — S88119A Complete traumatic amputation at level between knee and ankle, unspecified lower leg, initial encounter: Secondary | ICD-10-CM

## 2010-07-25 DIAGNOSIS — Z79899 Other long term (current) drug therapy: Secondary | ICD-10-CM | POA: Insufficient documentation

## 2010-07-25 DIAGNOSIS — M549 Dorsalgia, unspecified: Secondary | ICD-10-CM | POA: Insufficient documentation

## 2010-07-25 DIAGNOSIS — Z7901 Long term (current) use of anticoagulants: Secondary | ICD-10-CM | POA: Insufficient documentation

## 2010-07-25 DIAGNOSIS — R209 Unspecified disturbances of skin sensation: Secondary | ICD-10-CM | POA: Insufficient documentation

## 2010-07-29 ENCOUNTER — Other Ambulatory Visit: Payer: Self-pay | Admitting: Physical Medicine and Rehabilitation

## 2010-07-29 DIAGNOSIS — M5442 Lumbago with sciatica, left side: Secondary | ICD-10-CM

## 2010-08-02 ENCOUNTER — Ambulatory Visit (HOSPITAL_COMMUNITY)
Admission: RE | Admit: 2010-08-02 | Discharge: 2010-08-02 | Disposition: A | Discharge status: Home / Self Care | Payer: Medicare Other | Source: Ambulatory Visit | Attending: Physical Medicine and Rehabilitation | Admitting: Physical Medicine and Rehabilitation

## 2010-08-02 DIAGNOSIS — M5137 Other intervertebral disc degeneration, lumbosacral region: Secondary | ICD-10-CM | POA: Insufficient documentation

## 2010-08-02 DIAGNOSIS — M5442 Lumbago with sciatica, left side: Secondary | ICD-10-CM

## 2010-08-02 DIAGNOSIS — S88119A Complete traumatic amputation at level between knee and ankle, unspecified lower leg, initial encounter: Secondary | ICD-10-CM | POA: Insufficient documentation

## 2010-08-02 DIAGNOSIS — M545 Low back pain, unspecified: Secondary | ICD-10-CM | POA: Insufficient documentation

## 2010-08-02 DIAGNOSIS — N289 Disorder of kidney and ureter, unspecified: Secondary | ICD-10-CM | POA: Insufficient documentation

## 2010-08-02 DIAGNOSIS — M51379 Other intervertebral disc degeneration, lumbosacral region without mention of lumbar back pain or lower extremity pain: Secondary | ICD-10-CM | POA: Insufficient documentation

## 2010-08-02 DIAGNOSIS — M47817 Spondylosis without myelopathy or radiculopathy, lumbosacral region: Secondary | ICD-10-CM | POA: Insufficient documentation

## 2010-08-19 ENCOUNTER — Ambulatory Visit: Payer: MEDICARE

## 2010-08-22 ENCOUNTER — Encounter
Payer: Medicare Other | Attending: Physical Medicine and Rehabilitation | Admitting: Physical Medicine and Rehabilitation

## 2010-08-22 DIAGNOSIS — S88119A Complete traumatic amputation at level between knee and ankle, unspecified lower leg, initial encounter: Secondary | ICD-10-CM | POA: Insufficient documentation

## 2010-08-22 DIAGNOSIS — G8929 Other chronic pain: Secondary | ICD-10-CM | POA: Insufficient documentation

## 2010-08-22 DIAGNOSIS — N289 Disorder of kidney and ureter, unspecified: Secondary | ICD-10-CM | POA: Insufficient documentation

## 2010-08-22 DIAGNOSIS — M545 Low back pain, unspecified: Secondary | ICD-10-CM | POA: Insufficient documentation

## 2010-08-22 DIAGNOSIS — M79609 Pain in unspecified limb: Secondary | ICD-10-CM

## 2010-08-22 DIAGNOSIS — M47817 Spondylosis without myelopathy or radiculopathy, lumbosacral region: Secondary | ICD-10-CM | POA: Insufficient documentation

## 2010-08-22 DIAGNOSIS — M51379 Other intervertebral disc degeneration, lumbosacral region without mention of lumbar back pain or lower extremity pain: Secondary | ICD-10-CM | POA: Insufficient documentation

## 2010-08-22 DIAGNOSIS — M5137 Other intervertebral disc degeneration, lumbosacral region: Secondary | ICD-10-CM | POA: Insufficient documentation

## 2010-08-23 NOTE — Assessment & Plan Note (Signed)
HISTORY OF PRESENT ILLNESS:  The patient is a pleasant 55 year old married female who is being followed at our [facility] for chronic pain complaints related to her low back and her left lower extremity.  She is status post a below-knee amputation and has had chronic residual limb pain.  Over recent months, she has developed pain which she feels is a phantom limb type pain into the toes of her left lower extremity as well as she has had worsening back pain.  She has been rather active over the last few months taking care of her mother up in [place].  She has done several trips up there, assisted her mother, and over that period of time had developed increased low back pain and leg pain.  Eventually, an MRI was ordered with consideration of possible lumbar injections.  It appeared to be sciatic or foraminal in nature.  The MRI which was done on August 02, 2010 was reviewed with her today showing a mild spondylosis and degenerative disk disease with borderline right foraminal stenosis at L4-L5 as well as involvement of facets at L5-S1.  Incidental right kidney lesion, likely a cyst per Radiology was noted.  The patient back in today reports overall improvement in her low back pain.  She states that she has had some help with some of the family issues and she has not had to be quite so active regarding this.  She still has some discomfort into the left lower extremity where she rates it as 4 on a scale of 10.  She finds her medications are giving her fair relief at this time.  She is not interested in more aggressive management other than medications at this point.  FUNCTIONAL STATUS:  She can walk about 10 minutes at a time.  She is able to climb stairs and drive.  She is independent with self-care.  She denies problems controlling bowel or bladder.  She denies any new weakness, numbness, or tingling.  Denies depression, anxiety, or suicidal  ideation.  SOCIAL HISTORY:  Otherwise unchanged.  PHYSICAL EXAMINATION:  Vital Signs:  Today, her blood pressure is 128/66, pulse 81, respirations 18, and 96% saturation on room air. Neurologic:  She is a well-developed, well-nourished woman who does not appear in any distress.  She is oriented x3.  Speech is clear.  Affect is bright.  She is alert, cooperative, and pleasant.  Follows commands without difficulty and answers my questions appropriately.  Cranial nerves coordination are intact.  Reflexes are 2+ at patellar tendons and right Achilles tendon.  No abnormal tone, clonus, or tremors are noted. Motor strength is good in both lower extremities without obvious focal deficit.  No sensory deficits are appreciated.  She is able to transition easily from sitting to standing.  Gait is not antalgic, slightly uneven due to prosthesis.  Lumbar range of motion is without discomfort today.  Her left lower extremity residual limb is examined after removal of the prosthesis.  She has no open skin areas.  Mild tenderness is noted in the distal residual limb, very mild, however.  Good strength is noted in the limb.  No new sensory deficits are appreciated.  No obvious neuromas are appreciated either.  IMPRESSION: 1. Resolution of lumbago with radiating pain to the left lower     extremity.  Stabbing, burning pain has improved significantly. 2. Status post below-knee amputation with chronic residual limb pain.  PLAN:  We will hold off on any kind of invasive  means to manage her pain at this time.  She is comfortable with current medications.  She has gotten some help at home and has not had to be as active and subsequently has noted overall improvement in her back and leg.  We will refill her Percocet 10/325 not more than 4 times today.  She continues to take Lyrica 25 mg in the morning and 75 mg at night.  She is comfortable with this current management plan.  She tells me she  is planning to travel back up to [place] in May.  She will be up there about 11 days apparently.  We will arrange for her to have the office visit with nursing staff for refill of her medications over the next 2 months.  I will see her back in 3.     Brantley Stage, M.D. Electronically Signed    DMK/MedQ D:  08/22/2010 12:43:18  T:  08/23/2010 02:12:59  Job #:  [id number]

## 2010-09-10 ENCOUNTER — Other Ambulatory Visit: Payer: Self-pay | Admitting: Oncology

## 2010-09-10 ENCOUNTER — Encounter (HOSPITAL_BASED_OUTPATIENT_CLINIC_OR_DEPARTMENT_OTHER): Payer: MEDICARE | Admitting: Oncology

## 2010-09-10 DIAGNOSIS — Z7901 Long term (current) use of anticoagulants: Secondary | ICD-10-CM

## 2010-09-10 DIAGNOSIS — Z86718 Personal history of other venous thrombosis and embolism: Secondary | ICD-10-CM

## 2010-09-10 DIAGNOSIS — D509 Iron deficiency anemia, unspecified: Secondary | ICD-10-CM

## 2010-09-10 DIAGNOSIS — D6859 Other primary thrombophilia: Secondary | ICD-10-CM

## 2010-09-10 LAB — CBC WITH DIFFERENTIAL/PLATELET
BASO%: 0.9 % (ref 0.0–2.0)
Eosinophils Absolute: 0.3 10*3/uL (ref 0.0–0.5)
MCHC: 34.7 g/dL (ref 31.5–36.0)
MONO#: 0.5 10*3/uL (ref 0.1–0.9)
NEUT#: 5.2 10*3/uL (ref 1.5–6.5)
RBC: 4.66 10*6/uL (ref 3.70–5.45)
WBC: 9.3 10*3/uL (ref 3.9–10.3)
lymph#: 3.4 10*3/uL — ABNORMAL HIGH (ref 0.9–3.3)
nRBC: 0 % (ref 0–0)

## 2010-09-10 LAB — PROTIME-INR: Protime: 18 Seconds — ABNORMAL HIGH (ref 10.6–13.4)

## 2010-09-16 ENCOUNTER — Encounter: Payer: MEDICARE | Attending: Physical Medicine and Rehabilitation

## 2010-09-16 DIAGNOSIS — G8929 Other chronic pain: Secondary | ICD-10-CM | POA: Insufficient documentation

## 2010-09-16 DIAGNOSIS — R209 Unspecified disturbances of skin sensation: Secondary | ICD-10-CM

## 2010-09-16 DIAGNOSIS — M545 Low back pain, unspecified: Secondary | ICD-10-CM | POA: Insufficient documentation

## 2010-09-16 DIAGNOSIS — S88119A Complete traumatic amputation at level between knee and ankle, unspecified lower leg, initial encounter: Secondary | ICD-10-CM | POA: Insufficient documentation

## 2010-09-16 DIAGNOSIS — M79609 Pain in unspecified limb: Secondary | ICD-10-CM | POA: Insufficient documentation

## 2010-09-16 DIAGNOSIS — G894 Chronic pain syndrome: Secondary | ICD-10-CM

## 2010-09-16 NOTE — Assessment & Plan Note (Signed)
Ms. Marovich returns to clinic today for followup evaluation.  She reports  that she has been doing fairly well.  She did spend sometime up in  [place] recently and did extra walking and standing.  She developed  slight abrasion to also over her left lateral leg in the area of her  left fibular head underneath her prosthesis.  That seems to be healing  now that she is back in [place] and is not standing and walking  quite as much as she was at [place].  She plans to follow up with  [facility] if the ulcer does not heal for adjustment of her prosthesis.   The patient has been using the Percocet in place of hydrocodone.  She  has been using it 2 tablets 3 times a day and reports that she gets  reasonable relief and slightly improved relief compared to the  hydrocodone.  She still notes periods of time late in the evening after  her last dose when she has increased pain.  She would like to have an  adjustment of her medications.  We recently refilled her Percocet as of  November 02, 2007.   MEDICATIONS:  1. Percocet 7.5/325 two tablets t.i.d. p.r.n. (approximately 6-7 per      day).  2. Lisinopril 10 mg daily.  3. Levothyroxine 75 mcg daily.  4. Protonix 40 mg daily.  5. Coumadin 4 mg daily.  6. Potassium chloride 10 mEq daily.   REVIEW OF SYSTEMS:  Noncontributory.   PHYSICAL EXAMINATION:  A well appearing middle-aged adult female in mild  acute discomfort.  Blood pressure 126/68 with pulse of 71, respiratory rate 18, and O2  saturation 100% on room air.  She has a healing ulcer over the left lateral fibula head, beneath her  prosthesis.  Otherwise, she has a 4+/5 strength throughout.   IMPRESSION:  1. Chronic left lower extremity pain, status post below-knee      amputation.  2. History of tobacco and alcohol abuse.  3. History of peripheral vascular disease.  4. History of bilateral pulmonary emboli on chronic Coumadin.  5. Right elbow tendinitis.   In the office today, we  did adjust her Percocet up to 2 tablets q.i.d.  as of November 23, 2007.  She will start using the increased dosage today  from her script filled November 02, 2007, and she will run out approximately  November 24, 2007.  The new script is written for November 23, 2007.  She will  then be using approximately 8 per day on an as needed basis.  We will  plan on seeing her in followup in approximately 4 months' time and she  will follow up with [facility] as necessary for the healing ulcer and  possible adjustment of her prosthesis.           ______________________________  Ellwood Dense, M.D.     DC/MedQ  D:  11/10/2007 09:18:03  T:  11/11/2007 01:41:40  Job #:  [id number]

## 2010-09-16 NOTE — Assessment & Plan Note (Signed)
Frances Finley returns to clinic today for followup evaluation.  She reports  the injection into her right elbow done March 10, 2007 helped  substantially.  She asked that we try to make time available if she  needs a quick injection over the next few months until she is seen in  followup.  She continues to use her hydrocodone approximately five times  per day and needs refill in the office today.  Her other medicines have  been unchanged.   MEDICATIONS:  1. Hydrocodone 10/325 one tablet 5 times per day p.r.n.  2. Lisinopril  10 mg daily.  3. Levothyroxine 75 mcg p.o. daily.  4. Protonix 40 mg daily.  5. Coumadin 4 mg daily.  6. Potassium chloride 10 mEq daily.   REVIEW OF SYSTEMS:  Noncontributory.   PHYSICAL EXAMINATION:  Well appearing, thin, adult female in mild acute  discomfort.  Her vitals showed a blood pressure of 111/71 with a pulse  80, respiratory rate 18, and O2 saturation 97% on room air.  She has  4+/5 strength throughout the bilateral upper and lower extremities.   IMPRESSION:  1. Chronic left lower extremity pain, status post below knee      amputation.  2. History of tobacco and alcohol abuse.  3. History of bilateral pulmonary emboli on chronic Coumadin.  4. History of peripheral vascular disease.  5. Right elbow tendinitis.   In the office today, we did refill the patient's hydrocodone 10/325 a  total of 150 as of today.  We will plan on seeing her in followup in  approximately four months' time.  If she has recurrence of the elbow  pain, we will try to get her in for a quick injection as schedule  permits.           ______________________________  Ellwood Dense, M.D.     DC/MedQ  D:  07/01/2007 14:06:56  T:  07/02/2007 14:05:59  Job #:  [id number]

## 2010-09-16 NOTE — Assessment & Plan Note (Signed)
[facility]                         GASTROENTEROLOGY OFFICE NOTE   NAME:Frances Finley, Frances Finley                      MRN:          [id number]  DATE:08/15/2007                            DOB:          04/23/1956    PROBLEM:  GERD.   HISTORY OF PRESENT ILLNESS:  Mrs. Frances Finley has returned for a medicine  refill.  She has GERD that is well controlled with Nexium.  She denies  dysphagia, hoarseness, or coughing.   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  2. She has had an embolic event to her lower extremity requiring      amputation.   MEDICATIONS:  Include Nexium, Lisinopril, levothyroxine, potassium,  Coumadin, Flonase, and simvastatin.   PHYSICAL EXAMINATION:  VITAL SIGNS:  Pulse 72, blood pressure 106/64,  weight 213.  HEENT: EOMI.  PERRLA.  Sclerae are anicteric.  Conjunctivae are pink.  NECK:  Supple without thyromegaly, adenopathy or carotid bruits.  CHEST:  Clear to auscultation and percussion without adventitious  sounds.  CARDIAC:  Regular rhythm; normal S1 S2.  There are no murmurs, gallops  or rubs.  ABDOMEN:  Bowel sounds are normoactive.  Abdomen is soft, nontender and  nondistended.  There are no abdominal masses, tenderness, splenic  enlargement or hepatomegaly.  EXTREMITIES:  She has a prosthetic leg.  RECTAL:  Deferred.   IMPRESSION:  1. Long-standing, well controlled on Nexium.  Barrett esophagus ought      to be ruled out.  2. 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  3. Probable hepatic steatosis.   RECOMMENDATION:  1. Continue Nexium.  2. Screening colonoscopy and upper endoscopy sometime in the next      year.  She will require Lovenox therapy and then a Coumadin window      in order to do the procedures which will be done at the same time.     Barbette Hair. Arlyce Dice, MD,FACG  Electronically Signed    RDK/MedQ  DD: 08/15/2007  DT: 08/15/2007  Job #: [id number]   cc:   Gabriel Earing, M.D.

## 2010-09-16 NOTE — Assessment & Plan Note (Signed)
Frances Finley returns to the clinic today for follow-up evaluation.  She  reports that she has started having a lot of pain in her right arm, both  in the triceps region of her right arm along with her elbow and her  lateral to posterior deltoid.  She reports no particular injury  resulting in the increased pain.  She reports that she has had  injections into the elbow past approximately six months ago in this  office.  She reports that she is not getting much relief from the  hydrocodone used two to two and a half tablets three times a day.  She  reports that she gets relief for only a couple of hours with each dose.  She has been using at least nine or 10 tablets over the past six days  without adequate relief.   MEDICATIONS:  1. Hydrocodone 10/325 one tablet five times per day as needed (eight      to 10 per day).  2. Lisinopril 10 mg daily.  3. Levothyroxine 75 mcg by mouth daily.  4. Protonix 40 mg by mouth daily.  5. Coumadin 4 mg by mouth daily.  6. Potassium chloride 10 mEq daily.   REVIEW OF SYSTEMS:  Noncontributory.   PHYSICAL EXAMINATION:  GENERAL:  Well-appearing adult female in moderate  acute discomfort involving her right arm.  VITAL SIGNS:  Blood pressure is 143/72 with a pulse of 78, respiratory  rate 18, oxygen saturation 97% on room air.   She has complaints of pain in the right posterior deltoid along with the  right triceps and right elbow region.   IMPRESSION:  1. Chronic left lower extremity pain, status post below-knee      amputation.  2. History of tobacco and alcohol abuse.  3. History of bilateral pulmonary emboli on chronic Coumadin.  4. History of peripheral vascular disease.  5. Right elbow tendinitis.   In the office today, we did have the patient sign a consent form for  trigger point injections.  I prepared a 10 mL syringe with 8 mL of 1%  lidocaine and 1 mL of Kenalog 40.  I cleaned one area on her right  posterior deltoid along with three  areas in her triceps and two areas in  her elbow in the extensor region.  Each area was cleansed with an  alcohol pad and then injected with approximately 1.5 mL of the above  noted mixture.  The patient tolerated the procedure and good hemostasis  was obtained at each injection site.   We will plan on seeing the patient in follow up in approximately June  2009.   In terms of her pain medicines, we have switched her to Percocet 7.5/325  one to two tablets by mouth three times daily as needed as of Sep 09, 2007 once her present supply of hydrocodone expires.  Will plan on  seeing her in follow-up as noted above.           ______________________________  Ellwood Dense, M.D.    DC/MedQ  D:  08/31/2007 10:14:53  T:  08/31/2007 10:29:45  Job #:  [id number]

## 2010-09-16 NOTE — Assessment & Plan Note (Signed)
Ms. Roselli returns to the clinic today for follow up evaluation.  Overall, she reports that she is doing fairly well. She has been using  her hydrocodone at a maximum of 5 per day. Her prescription is only up  to 100 tablets per month per her insurance restrictions. She does  complain of some increased right elbow pain. She reports that she had  been diagnosed with right elbow tendinitis in the past and received a  cortisone injection at least ten years ago. That helped her for an  extended period of time. She has been doing a lot of painting of her  sons room recently and wonders if that increased her pain. Usually, she  is able to use a strap over her right forearm and that helps, but for  some reason that has not given her any relief. She requests an injection  into her right elbow.   MEDICATIONS:  1. Hydrocodone 7.5/325 mg 1 tablet 5 times per day p.r.n.  2. Lisinopril 10 mg.  3. Levothyroxine 75 mcg daily.  4. Protonix 40 mg.  5. Coumadin 4 mg.  6. Potassium chloride 10 mEq daily.   REVIEW OF SYSTEMS:  Non-contributory.   PHYSICAL EXAMINATION:  GENERAL:  Well appearing fit adult female in mild  acute discomfort involving her right elbow.  VITAL SIGNS:  Blood pressure 147/80, pulse 78, respiratory rate 16, O2  saturation 96% on room air.   She has 4+/5 strength throughout the bilateral upper and -5/5strength  throughout the bilateral lower extremities.   IMPRESSION:  1. Chronic left lower extremity pain, status post below knee      amputation.  2. History of tobacco abuse and alcohol abuse.  3. History of cocaine abuse per patient, but denied by patient.  4. History of bilateral pulmonary emboli on chronic Coumadin.  5. History of peripheral vascular disease.  6. Right elbow tendinitis.   In the office today, we did refill the patient's hydrocodone. We also  had her sign a consent for right elbow joint injection with steroid. I  prepared a 3 cc syringe with 2 cc of 1%  Lidocaine and 1 cc of Kenalog  40. I cleansed the right elbow and injected laterally with a good  tolerance by the patient. Good hemostasis was obtained at the injection  site. The patient tolerated the procedure well.   We will plan on seeing the patient in follow up in this office in  approximately 3 to 4 months time with refill of medication prior to that  appointment as necessary.           ______________________________  Ellwood Dense, M.D.     DC/MedQ  D:  03/11/2007 14:05:32  T:  03/12/2007 03:04:30  Job #:  [id number]

## 2010-09-16 NOTE — Assessment & Plan Note (Signed)
Frances Finley is a 55 year old woman who was last seen by me on  August 25, 2008.  She is back in today for refill of her Percocet.   She has a history of chronic pain in her left below-knee amputation.  At  the last visit, she had had some right knee pain and had been treated  for shoulder, rotator cuff tendonitis as well.   She has had some physical therapy for the room right knee and reports  that is no longer a problem for her and she continues to do a home  exercise program for her right knee as well as her right shoulder and  reports no further pain in either of these 2 areas.   She had tried the Neurontin, but stopped it on her own because she  became too sleepy around 6 p.m.  Upon questioning, when she was taking  her medications, she woke up at 11 a.m. in the morning and took good  part of her Neurontin throughout the afternoon and early evening.   Her average pain is typically about 7 on a scale of 10, interfering  minimally with activity level.  Her pain is typically worse when she is  active and walking, improves with rest and medication.  She reports good  relief with Percocet at this time.   She is independent with self-care and mobility.  She can walk at least  20 minutes at a time.  Review of systems is positive for occasional  numbness, tingling, spasms, and constipation.   No changes in past medical, social, or family history since last visit.   PHYSICAL EXAMINATION:  VITAL SIGNS:  Blood pressure is 142/78, pulse 83,  respirations 18, and 97% on saturated on room air.  GENERAL:  She is a well-developed, well-nourished woman who does not  appear in any distress.  She is oriented x3.  Speech is clear.  Affect  is bright.  She is alert, cooperative, and pleasant.  Follows commands  without difficulty.  Answers questions appropriately.   Cranial nerves and coordination are grossly intact.  Reflexes are  slightly brisk in the lower extremities at bilateral  patellar tendons  and 2+ at the right Achilles tendon, 2+ at the biceps, triceps,  brachioradialis.  No new sensory deficits are appreciated.  Motor  strength is excellent 5/5 in the upper extremities and 5/5 the lower  extremities.   Gait, she transitions easily from sitting to standing.  Gait in the room  is stable, using her left prosthesis.   She has full shoulder range of motion bilaterally today.  Lumbar motion  does not aggravate her pain, forward flexion or extension.   Tandem gait was not assessed today.  Romberg test was not assessed  today.   IMPRESSION:  1. Resolution of rotator cuff tendonitis on the right.  2. Resolution of right knee pain, status post physical therapy.  3. Status post left below-knee amputation with chronic residual limb      pain, had improved with Neurontin.  However, Neurontin seems to      make her too sedated toward the early evening.   PLAN:  We will readjust Neurontin doses.  We asked her to take a dose at  11 a.m., 1 at 3 p.m., 1 at 9 p.m., and 1 at 2 a.m.  She has sleep wake  schedule which is as follows, she typically goes to sleep around between  2 and 4 a.m. and wakes up at 11 a.m.  PLAN:  We will refill her Percocet 10/325 one p.o. q.i.d. p.r.n. left  leg pain, number 120, no refills.  Pill counts have been appropriate.  Station medications as prescribed.  No aberrant behavior has been  observed.  We will restart her Neurontin titrating up slowly with 1 dose  at 11, 1 dose at 3, 1 dose at 9, and 1 dose at 2 a.m.  She will trial  this new schedule and see if her sedation in the early evening improves.  We will see her back in 3 months and nursing visit over the 2 few months  for pill counts and refill of her medications.           ______________________________  Brantley Stage, M.D.     DMK/MedQ  D:  11/23/2008 11:57:32  T:  11/24/2008 01:18:19  Job #:  [id number]

## 2010-09-16 NOTE — Group Therapy Note (Signed)
[name] is a 55 year old married woman who has been followed  in the past by Dr. Ellwood Finley.  She is back in today for a recheck  and a refill of her medications.  She was last seen by Dr. Thomasena Finley on  March 19, 2008.  She is here for refill of her Percocet.  She takes  7.5/325, 2 tablets 4 times a day for a total of 8 pills per day.   She states her pain is located in the distal end of her left residual  limb of her below knee amputation.  Pain she reports is fairly constant.  With medications she gets near complete relief.  Pain dissipates when  she takes the tablet and returns about 3-1/2 hours after she uses the  medication.   Pain is really not impacted by activity for the most part.  Pain is not  typically worsen at this time at 2 on a scale of 10.  Pain is described  as aching, stabbing, and sharp in nature.   Pain is worse with walking and standing, improves with rest, and pacing  her activities and medication.   FUNCTIONAL STATUS:  The patient walks 20 minutes at a time.  She is able  to climb stairs and drive.  She is independent with self-care, a high-  functioning woman, needs assistance with higher level household tasks.   REVIEW OF SYSTEMS:  Negative for problems controlling bowel or bladder,  admits to some numbness around the distal end of her residual limb.  Occasional spasms are noted.  She denied depression or anxiety.  Denied  suicidal ideation.   Also positive review of systems for easy bleeding, currently on  Coumadin.   ALLERGIES:  None known.   Medications at this time include the following:  Levothroid, potassium,  Nexium, Chantix, simvastatin, vitamin D, warfarin, HCT, B12, fish oil,  and probiotic.   From this clinic, she has been given prescriptions for Percocet 7.5/325,  2 tablets q.i.d., #240.   Physicians currently involved in her care, oncologist manages Coumadin,  Dr. Welton Finley sees her every month and Dr. Georgette Finley at  [facility] is her  primary care physician.   PAST MEDICAL HISTORY:  Positive for:  1. History of PE.  2. History of left DVT.  3. Iron deficiency.  4. Hypothyroidism.  5. Hypertension.  6. GERD.  7. Tobacco abuse, currently on Chantix and has discontinued tobacco      times several months now.  8. History of EtOH use.  9. Admits to 1 glass of wine at night.  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 Arbie Finley.  11.History of aortic thromboembolism, January 2004.  12.History of antiphospholipid syndrome.  13.History of hysterectomy and cholecystectomy.   The patient denies history of any illegal substance use, denies current  illegal substance use, admits to a glass of wine each night, denies  smoking.   FAMILY HISTORY:  Positive for mother at age 37, healthy and father at  age 64, healthy.   PHYSICAL EXAMINATION:  Today, blood pressure is 156/74, pulse 80,  respirations 18, and 97% saturated on room air.  She is an obese, well-developed, well-nourished woman who does not  appear in acute distress.   She is oriented x3.  Speech is clear.  Affect is bright.  She is alert,  cooperative, and pleasant; follows commands without difficulty; answers  questions appropriately.   Cranial nerves and coordination are intact.  Reflexes are slightly brisk  at the bilateral patellar tendons, 2+ at the Achilles tendon, and 2+ at  the biceps, triceps, and brachioradialis.   Sensation is intact with the exception of the distal end of her left  residual below knee limb amputation.   Motor strength 5/5 in the upper and lower extremities without focal  deficits.   Left residual limb is examined.  She has a below knee amputation,  nonadherent scar is appreciated.  No open areas are appreciated.  She  has 180 degrees of extension and 120 degrees of flexion.   She transitions from sitting to standing without difficulty.  Her gait  is notable for slight  unevenness.  She has a good stride length and does  not complain of pain during the gait cycle.  She has some difficulty  with tandem gait, but performs an adequate Romberg test.   IMPRESSION:  1. 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 Arbie Finley.  2. Chronic left leg pain status post amputation, may have neuropathic      component.  3. Intermittent low back pain, currently not a major problem for her      at this time.   Major medical problems include anticoagulated per Dr. Welton Finley on Coumadin,  history of antiphospholipid syndrome, history of ethyl alcohol use,  gastroesophageal reflux disease, hypertension, and hypothyroidism.   PLAN:  The patient has been taking Percocet 7.5/325, 2 tablets 4 times a  day.  We will switch her to Percocet 10/325, 4 times a day, #120, no  refills.  Anticipate trialing her on Neurontin or Lyrica in the next  month or so.  She seems to open to this option to help manage her pain.  She states she has had quite a bit of weight gain since she stopped  smoking.  We would like to see her increase her overall activity.  She  has expressed interest in obtaining a treadmill.  I have given her a  positive feedback regarding this.  I have asked her to start walking at  least once a day 15 minutes for the first week, moving toward 15 minutes  twice a day in the Finley week.  We will see her back in a month.  I  have a urine drug screen pending on her today and await results.           ______________________________  Frances Finley, M.D.     DMK/MedQ  D:  07/13/2008 11:01:47  T:  07/13/2008 23:46:37  Job #:  [id number]   cc:   Frances Second, MD  Fax: [phone]   Frances Finley

## 2010-09-16 NOTE — Assessment & Plan Note (Signed)
Frances Finley returned to clinic today for followup evaluation.  Frances Finley  reports that Frances Finley is getting slightly less benefit from the hydrocodone  7.5 strength.  Frances Finley was switched to the 7.5 strength as of February 2007  and has been on that for at least a year and a half now.  Frances Finley probably  needs a slight adjustment.  Frances Finley continues to get good benefit overall.  Her insurance continues to cover her only for a 20 day supply at 100  tablets.   MEDICATIONS:  1. Hydrocodone 7.5/325 one tablet 5 times per day p.r.n.  2. Lisinopril 10 mg daily.  3. Levothyroxine 75 mcg daily.  4. Protonix 40 mg daily.  5. Coumadin 4 mg daily.  6. Potassium chloride 10 mEq daily.   REVIEW OF SYSTEMS:  Positive for easy bleeding.   PHYSICAL EXAMINATION:  Well-appearing, fit adult female in mild to no  acute discomfort.  Blood pressure 128/69 with a pulse of 79, respiratory  rate 16, and O2 saturation 96% on room air.  Frances Finley has 4+ over 5 strength  throughout the bilateral upper extremities and 5- over 5 strengths in  bilateral lower extremity.   IMPRESSION:  1. Chronic left lower extremity pain, status post below knee      amputation.  2. History of tobacco abuse and alcohol abuse.  3. History of cocaine abuse per chart but denied by patient.  4. History of bilateral pulmonary embolism on chronic Coumadin.  5. History of peripheral vascular disease.   In the office today we did refill the patient's betamethasone cream Frances Finley  uses for psoriasis.  We also increased her hydrocodone to a 10 mg/325  strength.  Frances Finley will be using 1 tablets 5 times per day p.r.n. and Frances Finley is  given 100 tabs on the script.  We will plan on seeing her in followup in  approximately 4 months.           ______________________________  Ellwood Dense, M.D.     DC/MedQ  D:  11/08/2006 14:12:07  T:  11/08/2006 15:47:03  Job #:  [id number]

## 2010-09-16 NOTE — Assessment & Plan Note (Signed)
[name] is a pleasant 55 year old woman who was last seen by  me on August 20, 2008.  She is a 55 year old married woman who has been  followed in the past by Dr. Thomasena Finley.  She is back in today for refill of  her medications.  She has chronic pain secondary to chronic residual  limb pain status post a left below-knee amputation.  She also has been  complaining of some recent right knee pain.   She had been started on Neurontin.  She states that she has noticed an  overall improvement in her pain.  The Neurontin is help get rid of all  the pain, but it does relieve the jolting sensation she has been  getting.  Her average pain last month was between 6 and 7 on a scale of  10, currently it is at a 4 on a scale of 10.   Pain is constant in the left residual limb.  Her right knee is swelled  up on her while she was traveling last month.  She was seen out of  state.  She had a right knee aspiration done while she was out of state.  She states that they were not able to remove any fluid from the knee  during the aspiration.   The right knee is overall feeling somewhat better.  She states it is no  longer swollen but it is still somewhat achy, especially worse with  weightbearing.   She reports improvement in her right shoulder.  No longer any pain.  She  has a couple more therapy visits to finish up.  She found the therapy to  be very beneficial.   FUNCTIONAL STATUS:  She is able to walk 20 minutes at a time.  She can  climb stairs.  She is driving.  She is independent with self-care.  Overall, high functioning individual.   Denies problems controlling bowel or bladder.  Denies depression,  anxiety, or suicidal ideation.  Denies weakness.  Reports numbness and  tingling especially in the left residual limb.   Further review of systems is noncontributory to today's visit.   Social and family history are noncontributory.  No changes since last  visit.   Medications  prescribed through our clinic include the following:  1. Percocet 10/325 up to 4 times a day.  2. Neurontin 300 mg q.i.d.   PHYSICAL EXAMINATION:  Today, blood pressure is 140/70, pulse 67,  respiration 18, 98% saturated on room air.   She is a mildly obese white female who does not appear in any distress.  She is oriented x3.  Speech is clear.  Affect is bright.  She is alert,  cooperative, and pleasant.  Follows commands without difficulty, answers  questions appropriately.   Cranial nerves and coordination are intact.  Reflexes are symmetric at  the right and left patellar tendon and 2+ at the left Achilles tendon.   No new sensory deficits are appreciated.  Motor strength is 5/5.   Evaluation of the right knee shows no evidence of effusion today.  She  has full extension and 35 degrees of flexion, some medial laxity  compared to the lateral.  No AP instability is appreciated.  She has  some mild tenderness along the medial joint line but also with flexion  and extension of the knee under the patella.   Gait although nonantalgic is somewhat uneven.   IMPRESSION:  1. Resolution of rotator cuff tendonitis on the right with good range  of motion.  She has 180 degrees of abduction in the right upper      extremity, shoulder.  She has 90 degrees of external rotation and      approximately 50 degrees of internal rotation.  2. New right knee pain status post recent traveling and apparent knee      effusion; however, this is now resolved.  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  3. Status post left below-knee amputation with chronic residual limb      pain, overall improved with Neurontin.  4. Occasional low back pain, currently not a problem.   PLAN:  We would like her to finish up her right shoulder physical  therapy program at our next visit.  We consider some knee radiograph  possibly moving toward  MRI if she continues to have some swelling in the  right knee, also consider some physical therapy to address muscle  imbalances and treatment of chondromalacia of patella.  We will have  nursing visit next month to fill her medication for her.  I will see her  back in 2 months.  She has been stable on the above medications through  this clinic.  She does not display any aberrant behavior.  She uses her  medications appropriately.  Pill counts are appropriate.  We will see  her back in a month.           ______________________________  Frances Finley, M.D.     DMK/MedQ  D:  09/24/2008 11:05:03  T:  09/25/2008 02:55:40  Job #:  [id number]

## 2010-09-16 NOTE — Assessment & Plan Note (Signed)
Frances Finley returns to clinic today for followup evaluation.  She is  doing well using the Percocet approximately 8 per day.  She does need a  refill early December 2009.  She was out deer hunting this past weekend  and has good pain relief overall.  She is now 2-1/2 months from tobacco  use on Chantix and feeling much better overall.   MEDICATIONS:  1. Percocet 7.5/325 1-2 tablets p.o. q.i.d. p.r.n. (approximately 8      per day).  2. Lisinopril 10 mg daily.  3. Levothyroxine 75 mcg daily.  4. Protonix 40 mg daily.  5. Coumadin 4 mg daily.  6. Potassium chloride 10 mEq daily.   REVIEW OF SYSTEMS:  Positive for smoking cessation, easy bleeding, and  limb swelling.   PHYSICAL EXAMINATION:  A well-appearing middle-aged adult female in mild-  to-no acute discomfort.  Vitals were not obtained.  She has 4+/5  strength throughout.  She ambulates without any assistive device with a  slight limp of her left lower extremity.   IMPRESSION:  1. Chronic left lower extremity pain, status post below-knee      amputation.  2. History of tobacco and alcohol abuse.  3. History of peripheral vascular disease.  4. History of bilateral pulmonary emboli on chronic Coumadin.  5. Right elbow tendinitis.   In the office today, we did refill the patient's Percocet as of April 13, 2008.  She has transportation problems and this will ease that  problem for her.  We will plan on seeing her in followup in  approximately 4-5 months' time with refills prior to that appointment as  necessary. She continues to get good analgesic effect without signs of  diversion or significant side effects.           ______________________________  Ellwood Dense, M.D.     DC/MedQ  D:  03/19/2008 11:48:44  T:  03/20/2008 04:43:04  Job #:  [id number]

## 2010-09-16 NOTE — Assessment & Plan Note (Signed)
Frances Finley is a 55 year old married woman who had been followed  by Dr. Ellwood Dense in the past.  She is back in today for a recheck  and refill of her medications.  She has history of a left below-knee  amputation and chronic pain in that left residual limb.   Pain is typically worse in the morning.  It feels like it zings and  when she has been up walking out for a while.   Average pain is about 6 on a scale of 10, can go up to a 7.  Described  as constant, sharp, stabbing, tingling, and aching.   Sleep is fair.  Pain improves with rest and medications.  She gets fair  relief with current meds.   Medications which have been prescribed for her include Percocet, she had  been on 7.5/325 two tablets 4 times a day, this was reduced to 10/325  q.i.d.   She states that she had trouble decreasing her dose this much and took 5  tablets a day instead of 4 which was prescribed and required an early  refill.   The patient was counseled regarding taking medications as prescribed.   FUNCTIONAL STATUS:  The patient is able to walk 20 minutes at a time.  She is able to climb stairs and drive.  She states that she got a  treadmill and has been walking up to a mile, has reported 15-pound  weight loss.   She is independent with self-care and higher level household tasks and  overall very active and busy woman.   She is on Coumadin, otherwise no changes in past medical, social, and  family history.   PHYSICAL EXAMINATION:  Blood pressure is 153/79, pulse 74, respirations  16, 97% saturated on room air.  She is an obese, well-developed, well-  nourished female who does not appear in any distress.  She is oriented  x3.  Speech is clear.  Affect is bright.  She is alert, cooperative, and  pleasant.  Follows commands without difficulty, answers questions  appropriately.   Cranial nerves are grossly intact.  Coordination is intact.  Reflexes is  2+ at bilateral biceps, triceps,  brachioradialis, bilateral patellar  tendons, and bilateral hamstring reflexes are also present.   Mild sensory deficit over the left lateral aspect of her residual limb,  some tenderness with palpation especially in the distal residual limb  just medial to the fibula.   Motor strength is excellent, 5/5 upper and lower extremities without  focal deficit.   Right shoulder is evaluated today.  She has full shoulder range of  motion with abduction.  However, internal rotation is significantly  limited not more than 15 degrees on the right is appreciated with 90  degrees of external rotation; left, 90 degrees of external rotation  about 65-70 degrees of internal rotation.  She complains of pain with  Hawkins test as well as the Jobe supraspinatus testing does provoke pain  as well.   IMPRESSION:  1. New rotator cuff tendonitis/impingement syndrome.  2. Status post below-knee amputation with chronic residual limb pain.  3. Occasional low back pain currently not a problem at this time.   PLAN:  Spent time discussing the importance of taking medications as  prescribed.  The physician will obtain a UDS as well today.  Percocet  was written out 10/325 one p.o. q.i.d. #72.  She states she will be out  of town from August 09, 2008, until the August 23, 2008.  We will also  trial her on Neurontin 300 mg starting at night, titrating up to 4 times  a day.  Risks and benefits of this medication were discussed with her.  We will see her back in a month.           ______________________________  Brantley Stage, M.D.     DMK/MedQ  D:  08/20/2008 12:46:07  T:  08/21/2008 03:47:19  Job #:  [id number]

## 2010-09-19 NOTE — H&P (Signed)
NAMESHALANE, FLORENDO                         ACCOUNT NO.:  [id number]   MEDICAL RECORD NO.:  [id number]                   PATIENT TYPE:  OIB   LOCATION:  3315                                 FACILITY:  [facility]   PHYSICIAN:  Quita Skye. Hart Rochester, M.D.               DATE OF BIRTH:  Feb 09, 1956   DATE OF ADMISSION:  05/25/2002  DATE OF DISCHARGE:                                HISTORY & PHYSICAL   CHIEF COMPLAINT:  Infected left below-knee amputation.   BRIEF HISTORY:  The patient is a 55 year old white female who presented to  [facility] after referral from [facility] on 05/11/02.  On presentation she had an  ischemic left lower extremity and what appeared to be an embolus.  She had  ongoing symptoms prior to her presentation for 2 months.  At presentation  the pain was severe then she had cyanosis of her distal extremity.  Doppler  studies performed at [facility] on 04/06/02 and  venous Doppler studies at [facility] on 05/10/02 were forwarded to  our office.  Dr. Bosie Helper impression was that the patient had atheroemboli to  her left foot and he recommended proceeding with arteriogram the following  morning.  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  The superficial femoral  arteries and popliteals were patent bilaterally, the right peroneal and PT  were patent.  The patient was subsequently taken to the OR and underwent  aortic exploration with aortic endarterectomy and Dacron patch angioplasty  of the aortotomy site; left popliteal exploration with embolectomy of the  anterior tibial, posterior tibial and peroneal arteries on 05/12/02.  They  were unable to successfully open the trifurcation vessels, the patient  became profoundly ischemic and subsequently underwent a left below-knee  amputation on  05/15/02.  Her postoperative course was really quite normal,  she had no postoperative complications.  Her diet was advanced after several  days, she had no postoperative ileus, she remained in sinus rhythm.  She was  placed on a PCA pump initially, this was weaned off and she was placed on  Vicodin for pain control.  She was seen by the rehab group within the  hospital and it was their opinion, along with the patient's, that she was  mobilizing so well that she could undergo home PT.  She was subsequently  discharged home on 05/20/02.  Approximately 48 hours after discharge her  right leg became somewhat numb and when she stood, even though she had  someone helping her, she fell on her left stump.  Over the last 72 hours it  has become significantly more painful.  She was seen in the office 48 hours  ago and although it did not appear to be infected she was started  on  antibiotics but we do not have those listed.  The pain has continued to  progress and she has developed more drainage and presented to the office  today and was seen by Dr. Arbie Cookey.  There she was found to have erythema from  just below the patella all the way to the end of the stump with purulent  drainage coming from the distal stump.  At that point the patient was  subsequently transferred to [facility] and is scheduled for  incision and drainage of her stump with further treatment as indicated.   PAST MEDICAL HISTORY:  The patient has a history of hypertension and  hypothyroidism (she has been treated for 4 years), she has history of  gastroesophageal reflux disease.   ALLERGIES:  She denies any allergies but is intolerant of SHRIMP which  causes GI upset.   MEDICATIONS:  Medications on admission include an antibiotic which we do not  have recorded to start 48 hours ago.  Prilosec 40 mg once daily, Synthroid  175 mcg once daily, lisinopril/hydrochlorothiazide 10/25 one once daily,  Neurontin 300 mg once daily,  and Vicodin one to two p.o. q.4h. p.r.n.  She  was also placed on a 14 mg nicotine patch.   FAMILY AND SOCIAL HISTORY:  Please see the previous H&P which is attached.   SOCIAL HISTORY:  She smoked 1-1/2 packs cigarettes per day up until the day  of her admission on 05/12/02 dating back to her teenage years.  She uses  alcohol on a daily basis approximately 4-5 mixed drinks per day.   FAMILY HISTORY:  Family history is remarkable for mother with thyroid  disease.   PHYSICAL EXAMINATION:  GENERAL:  The patient is a 56 year old white female  in no acute distress but in a great deal of discomfort.  HEAD:  Normocephalic.  EYES:  PERRLA.  EOMs intact.  Fundi not visualized.  EARS/NOSE/THROAT/MOUTH:  Grossly within normal limits.  NECK:  Supple; no JVD, no bruits, no thyromegaly, no lymphadenopathy.  CHEST:  Clear to auscultation and percussion; no rales or rhonchi.  CARDIAC:  No murmurs, rubs, or gallops.  ABDOMEN:  Soft, nontender, positive bowel sounds; no palpable organomegaly.  GU/RECTAL:  Deferred.  EXTREMITIES:  The stump on the left is erythematous from just below the  patella all the way down to the end of the stump, there is a large amount of  purulent bloody discharge coming from the distal end of the stump through  the suture line.  There are no problems with the left lower extremity.  She  is also moving quite well from bed to chair without any significant amount  of help needed.  NEUROLOGIC:  No focal deficits.   IMPRESSION:  1. Infected left below-knee amputation stump.  2. Status post emboli to left foot with occlusion from the trifurcation     vessels followed by profound ischemia.  3. Hypertension.  4. Hypothyroidism.  5. Gastroesophageal reflux disease.  6. History of tobacco use 1-1/2 packs per day through 05/11/02.  7. Mild obesity.   PLAN:  The patient is admitted for incision and drainage of her stump with further treatment as indicated.     Eber Hong, P.A.                 Quita Skye Hart Rochester, M.D.    WDJ/MEDQ  D:  05/25/2002  T:  05/25/2002  Job:  [id number]   cc:   [facility] Office

## 2010-09-19 NOTE — Op Note (Signed)
   NAME:  Frances Finley, Frances Finley                         ACCOUNT NO.:  [id number]   MEDICAL RECORD NO.:  [id number]                   PATIENT TYPE:   LOCATION:                                       FACILITY:   PHYSICIAN:  Larina Earthly, M.D.                 DATE OF BIRTH:  06-Mar-1956   DATE OF PROCEDURE:  05/31/2002  DATE OF DISCHARGE:                                 OPERATIVE REPORT   PREOPERATIVE DIAGNOSIS:  Open left below the knee amputation.   POSTOPERATIVE DIAGNOSIS:  Open left below the knee amputation.   PROCEDURE:  Revision and closure of left below the knee amputation.   SURGEON:  Dr. Tawanna Cooler Early.   ASSISTANT:  Claudette Krell, N.P.   ANESTHESIA:  General endotracheal.   COMPLICATIONS:  None.   DISPOSITION:  To recovery room stable.   PROCEDURE IN DETAIL:  The patient was taken to the operating room and placed  in the supine position where the area of the left open below knee amputation  was prepped and draped in the usual sterile fashion.  The skin edges were  resected back approximately 1-2 cm on both the anterior and posterior based  flap.  The muscle and the posterior flap was all resected in line with  electrocautery with the skin incision.  Soleus muscle was reflected further  proximally.  The patient had very viable appearing posterior flap.  The  anterior tibial muscle body was completely necrotic up under the fascial  flap.  This was debrided further proximally and all necrotic non-viable  muscle was removed.  The periosteum was elevated further proximally and the  tibia was resected approximately 3 cm shorter with a bone saw.  The fibula  was also shortened with bone shears.  The wounds were copiously irrigated  with normal saline.  Hemostasis was obtained with electrocautery.  A Blake  fluted drain was brought through a stab incision at the proximal most  portion of the debrided area of the anterior tibial muscle body and brought  out through a separate stab  incision at this level.  The posterior fascia  was closed to the anterior fascia with interrupted 0 Vicryl sutures.  The  skin was closed with skin clips.  Sterile dressing was applied and an Ace  wrap was applied, and the patient was taken to the recovery room in stable  condition.                                               Larina Earthly, M.D.    TFE/MEDQ  D:  05/31/2002  T:  05/31/2002  Job:  [id number]

## 2010-09-19 NOTE — Discharge Summary (Signed)
NAMEADITI, Frances Finley                         ACCOUNT NO.:  [id number]   MEDICAL RECORD NO.:  [id number]                   PATIENT TYPE:  INP   LOCATION:  5001                                 FACILITY:  [facility]   PHYSICIAN:  Frances Finley, M.D.                  DATE OF BIRTH:  [birth year]   DATE OF ADMISSION:  09/26/2002  DATE OF DISCHARGE:  09/30/2002                                 DISCHARGE SUMMARY   DISCHARGE DIAGNOSES:  1. Bilateral pulmonary embolisms.  2. Left leg deep venous thrombosis.  3. Iron-deficiency anemia.  4. Hypothyroidism.  5. Hypertension.  6. Gastrointestinal reflux disease.  7. Tobacco abuse.  8. Elevated alcohol intake.  9. 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  10.      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  11.      Antiphospholipid antibody syndrome.   DISCHARGE MEDICATIONS:  1. Lovenox 95 mg subcu injection q.12 h.  2. Coumadin 3 mg p.o. at bedtime.  3. Levothyroxine 75 mcg p.o. daily.  4. Colace 100 mg p.o. b.i.d.  5. NicoDerm CQ 14 mg patch daily.  6. Protonix 40 mg p.o. daily.  7. Ferrous sulfate 325 mg one p.o. t.i.d. with meals.  8. Vicodin 5/500 mg 1-2 q.4 h. as needed for pain.   FOLLOWUP:  The patient is to follow up at 2 p.m. on Monday, Oct 02, 2002, at  the [facility] where she will visit our Coumadin Clinic  and seen Dr. [name] followup.  The patient will also be seeing the  hematologist at the cancer center on October 10, 2002, for evaluation of  antiphospholipid antibody syndrome.   BRIEF HISTORY OF PRESENT ILLNESS:  [name] is a 55 year old female with a  history of aortic thromboembolism, who presented with pleuritic right-sided  chest pain since the morning prior to admission.  She described the pain as  constant, associated with coughing, crying, and movement and radiating to  the back, neck, and side.  She reported  shortness of breath with the pain  and some nausea, but no vomiting.  She denied diaphoresis.  Her breathing  has been faster and more shallow since the pain began.  The pain was a 9/10  at its worse and 5/10 after receiving Dilaudid in the emergency room.   The patient also reported that she traveled to [place] and had a four  hour car ride 2-3 weeks ago.  She reports that she does continue to smoke  about a pack per day.   PHYSICAL EXAMINATION:  VITAL SIGNS:  Pulse 96, blood pressure 118/68,  temperature 100.9 degrees, respirations 24, oxygen saturation 97% on room  air.  CARDIOVASCULAR:  Regular rate and rhythm.  No murmurs, rubs, or gallops.  LUNGS:  Clear to auscultation bilaterally with decreased breath sounds in  the right lower lobe base.  ABDOMEN:  Soft, nontender, and nondistended.  Bowel sounds are positive.  RECTAL:  Heme-negative brown stool.  EXTREMITIES:  The patient is status post left BKA.   LABORATORY DATA:  Chest x-ray showed a right lower lobe pleural effusion and  question of right lower lobe infiltrate.  CT of the chest showed acute  pulmonary emboli within the right lower lobe and in the left upper lobe.  CT  of the legs showed filling defect in the left common femoral vein and  several filling defects in the left popliteal vein consistent with deep  venous thromboses.   EKG:  Normal sinus rhythm.   D-dimer was 0.90.  Admission PT 13.1, INR 0.9, PTT 37.  Initial cardiac  enzymes were negative.  Sodium 135, potassium 3.0, chloride 102, bicarbonate  27, BUN 9, creatinine 0.5.  Hemoglobin 8.8, hematocrit 27.8, white blood  cell count 7.9, MCV 69.2.   HOSPITAL COURSE:  Problem #1.  Bilateral pulmonary embolisms and deep venous  thrombosis:  The patient was started on heparin immediately and begun on  Coumadin.  She received 10 mg of Coumadin the first night.  This was  titrated downward.  Her INR on discharge was 2.1.  The patient was able to  give  herself Lovenox injections on the morning of discharge.  She was sent  home on 3 mg of Coumadin p.o. at bedtime and Lovenox 95 mg subcu q.12 h.  She will follow up in the [facility] on Monday, at which  point, if she is still therapeutic on her Coumadin she will no longer need  Lovenox injections as she would have had an appropriate bridging time.  The  patient is encouraged to cease smoking.  She was sent out on a nicotine  patch.  The patient was also encouraged to minimize her alcohol intake while  on the Coumadin.  The patient reports previously that she drinks 5-6 drinks  per night.  During her hospitalization, the patient showed no signs of  withdrawal.  She was placed on thiamine and folate.  Workup of the patient's  DVT and her arterial emboli in January revealed protein C low at 58.  PTTLA  for an antiphospholipid evaluation was high at 52.4.  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  It was felt that the patient has antiphospholipid antibody  syndrome.  Hematology/oncology was consulted and they have set the patient  up to be seen in their outpatient clinic on October 10, 2002.   Problem #2.  Iron-deficiency anemia:  The patient reports that she had a  negative EGD and colonoscopy about 8-9 years ago.  Her stools remained heme  negative.  The ferritin was 12, iron was 13, and percent saturation 4.  These were low and the patient was begun on oral iron supplementation.  The  most likely cause of the iron-deficiency anemia in a female in this age  range is menses; however, she may require further workup in the outpatient  setting.   Problem #3.  Hypothyroidism:  The patient's TSH was 2.697.  She was  continued on her outpatient dose of supplementation.   Problem #4.  Hypertension:  The patient's blood pressure medications were  held during the hospitalization and her blood pressure was actually very well controlled.  We will continue to hold her medicine until a followup   appointment on Monday at least.   Problem #5.  Gastrointestinal reflux disease:  The patient was continued  on  Protonix in the hospital and got good relief.   Problem #6.  Tobacco abuse:  The patient was put on a NicoDerm patch and  received a smoking cessation consult.  We will continue to support her in  her efforts to quit smoking because this would be life threatening given her  propensity to form clots.   Problem #7.  Question of alcohol abuse:  The patient's alcohol intake seems  high.  She was placed on thiamine and folate during the hospitalization.  She showed no signs of withdrawal.  The patient was counseled to keep her  alcohol consumption to a minimum due to its interaction with Coumadin.   DISPOSITION:  The patient was discharged home in stable condition.  PT 21.1,  INR 2.1.  Hemoglobin 8.1, white blood cells 7.2, platelets 392.  Sodium 138,  potassium 4.0, chloride 106, bicarbonate 29, BUN 4, creatinine 0.5, glucose  109.     Brent A. Viviann Spare, M.D.                   Frances Finley, M.D.    BAT/MEDQ  D:  09/30/2002  T:  09/30/2002  Job:  [id number]   cc:   Larina Earthly, M.D.  [address]  [place]  [place] [address]  Fax: [phone]   Outpatient Clinic

## 2010-10-01 NOTE — Assessment & Plan Note (Signed)
[name] is a pleasant 55 year old married female who is followed in our center for pain and rehabilitative medicine.  She is back in today for refill of her pain medications.  She has had a rough couple of months, her mother up in [place] passed away.  She had been making several trips up there to help out and then attend the funeral.  Ms. Elizondo is also on chronic warfarin treatment.  She has a history of a left below-knee amputation.  She has been having some increased pain in her back and her leg.  She describes the pain in her left leg is going all the way to her toes.  This has been getting worse for her. Her low back is bothering her when she sits for an extended period of time.  Pain is described as sharp burning, stabbing, tingling, and aching down into the left leg; worse when she is up standing.  Pain is rather constant.  Pain is improved somewhat with rest and medication. She is getting a little bit relief with the medications at this time.  She is using Lyrica typically 3 tablets in the evening.  The morning dose makes her a bit too sleepy.  She is having to take care of her grandchildren and finds that Percocet 4 times a day and Lyrica at night do not sedate her, and she can stay as active as she wants to be.  Pain is about 6-7 on a scale of 10.  She can walk about 10 minutes.  She is able to climb stairs.  She is driving.  She is independent with self-care.  Denies problems controlling bowel or bladder.  Admits to some numbness in the left residual limb.  Denies depression anxiety or suicidal ideation at this point.  No other changes in the past medical, social, or family history.  PHYSICAL EXAMINATION:  VITAL SIGNS:  Blood pressure is 153/87, pulse 99, respirations 18, 98% saturated on room air. GENERAL:  She is a well-developed, well-nourished woman who does not appear in any distress.  She is oriented x3.  Speech is clear.  Affect is overall  bright.  She is alert, cooperative, and pleasant.  Follows commands without difficulty, and she answered all questions appropriately.  She did cry somewhat when discussing her mother and her pain recently, but she brightened up again.  On exam, today her blood pressure is 153/87, pulse 99, respiration 18, 98% saturated on room air.  Her cranial nerves are intact.  Her strength is good in both upper and lower extremities.  No abnormal tone, clonus, or tremors.  She does have a grade weakness on the left hamstring compared to the right.  Reflexes are unchanged from previous visit.  She has some patchy areas of decreased sensation in the residual limb on the left.  I had her remove the prosthesis.  She does not have any open areas in the left leg or in the left limb excuse me.  Transitioning from sitting to standing with prosthesis on.  She has a slightly uneven gait.  Her gait is, however, quite stable.  Flexion extension in the back aggravates her minimally.  She has some limitations in motion with forward flexion.  IMPRESSION: 1. Lumbago with radiating pain into the left lower extremity residual     limb.  She tells me it feels like the toes are stabbing and burning     at times. 2. Status post below-knee amputation with chronic residual limb pain.  PLAN:  I would like to pursue MRI of the lumbar spine to rule out radiculopathy.  Her left leg pain has been bothering her a bit more lately.  We will refill the following medications for her Lyrica as well as Percocet 10/325 one p.o. q.i.d., #120.  She is comfortable with our management plan.  I will see her back after the MRI is completed to rule out left sciatica in addition to her history of left below-knee amputation.     Brantley Stage, M.D. Electronically Signed    DMK/MedQ D:  07/25/2010 14:04:11  T:  07/25/2010 23:39:46  Job #:  [id number]

## 2010-10-13 ENCOUNTER — Encounter (HOSPITAL_BASED_OUTPATIENT_CLINIC_OR_DEPARTMENT_OTHER): Payer: Medicare Other | Admitting: Oncology

## 2010-10-13 ENCOUNTER — Other Ambulatory Visit: Payer: Self-pay | Admitting: Oncology

## 2010-10-13 DIAGNOSIS — I821 Thrombophlebitis migrans: Secondary | ICD-10-CM

## 2010-10-13 DIAGNOSIS — Z86718 Personal history of other venous thrombosis and embolism: Secondary | ICD-10-CM

## 2010-10-13 DIAGNOSIS — Z7901 Long term (current) use of anticoagulants: Secondary | ICD-10-CM

## 2010-10-13 DIAGNOSIS — D6859 Other primary thrombophilia: Secondary | ICD-10-CM

## 2010-10-13 DIAGNOSIS — Z5181 Encounter for therapeutic drug level monitoring: Secondary | ICD-10-CM

## 2010-10-13 LAB — PROTIME-INR: Protime: 20.4 Seconds — ABNORMAL HIGH (ref 10.6–13.4)

## 2010-10-14 ENCOUNTER — Encounter: Payer: Medicare Other | Attending: Neurosurgery | Admitting: Neurosurgery

## 2010-10-14 DIAGNOSIS — M6580 Other synovitis and tenosynovitis, unspecified site: Secondary | ICD-10-CM | POA: Insufficient documentation

## 2010-10-14 DIAGNOSIS — M545 Low back pain, unspecified: Secondary | ICD-10-CM | POA: Insufficient documentation

## 2010-10-14 DIAGNOSIS — R269 Unspecified abnormalities of gait and mobility: Secondary | ICD-10-CM | POA: Insufficient documentation

## 2010-10-14 DIAGNOSIS — M79609 Pain in unspecified limb: Secondary | ICD-10-CM | POA: Insufficient documentation

## 2010-10-14 DIAGNOSIS — G547 Phantom limb syndrome without pain: Secondary | ICD-10-CM

## 2010-10-14 DIAGNOSIS — M5430 Sciatica, unspecified side: Secondary | ICD-10-CM

## 2010-10-15 NOTE — Assessment & Plan Note (Signed)
[name] is a patient of Dr. 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  The patient tells me that her elbows bother her more than anything.  At this point in time, she rates her pain is 6-7 and it is a dull, stabbing pain, it is constant, evenings are worse.  Sleep patterns are fair.  Standing, some activities and walking tend to aggravate.  Rest and medication seemed to help.  Functionality, she is independent.  She can walk. She drives, only has a slight limp with the prosthesis.  REVIEW OF SYSTEMS:  Notable for those difficulties, otherwise within normal limits.  PAST MEDICAL HISTORY:  Unchanged.  SOCIAL HISTORY:  Unchanged.  FAMILY HISTORY:  Unchanged.  PHYSICAL EXAMINATION:  VITAL SIGNS:  Blood pressure to be 135/72, pulse 94, respirations 18, O2 sats 98 on room air.  Her quadriceps strength good in both lower extremities.  Sensation is intact.  She has point tender to the right elbow on the anterolateral aspect around the epicondyle and she has some pain with range of motion. Constitutionally, she is within normal limits.  She is oriented x3.  Her affect is bright and alert.  IMPRESSION: 1. Lumbago. 2. 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 3. Right tenosynovitis elbow.  PLAN: 1. We will go ahead and refill her oxycodone 10/325 one p.o. q.i.d.     120 with no refill. 2. After informed consent, alcohol prep to the anterolateral portal,     her right elbow injected with 0.5 mL 20 mg of Depo-Medrol and 0.5     mL of lidocaine.  She tolerated well.  No bleeding.  She knows to     ice this tonight and tomorrow. 3. She will follow up in the clinic with Dr. Pamelia Hoit as scheduled 1     month.  Her questions were encouraged and answered.     Crit Obremski L. Blima Dessert Electronically Signed    RLW/MedQ D:  10/14/2010 14:38:28  T:  10/15/2010  06:39:36  Job #:  [id number]

## 2010-10-29 ENCOUNTER — Encounter (HOSPITAL_BASED_OUTPATIENT_CLINIC_OR_DEPARTMENT_OTHER): Payer: Medicare Other | Admitting: Oncology

## 2010-10-29 ENCOUNTER — Other Ambulatory Visit: Payer: Self-pay | Admitting: Oncology

## 2010-10-29 DIAGNOSIS — D6859 Other primary thrombophilia: Secondary | ICD-10-CM

## 2010-10-29 DIAGNOSIS — Z86718 Personal history of other venous thrombosis and embolism: Secondary | ICD-10-CM

## 2010-10-29 DIAGNOSIS — I821 Thrombophlebitis migrans: Secondary | ICD-10-CM

## 2010-10-29 DIAGNOSIS — Z7901 Long term (current) use of anticoagulants: Secondary | ICD-10-CM

## 2010-10-29 DIAGNOSIS — Z5181 Encounter for therapeutic drug level monitoring: Secondary | ICD-10-CM

## 2010-10-29 LAB — PROTIME-INR: INR: 2.3 (ref 2.00–3.50)

## 2010-11-11 ENCOUNTER — Encounter: Payer: Medicare Other | Attending: Neurosurgery | Admitting: Neurosurgery

## 2010-11-11 DIAGNOSIS — S88119A Complete traumatic amputation at level between knee and ankle, unspecified lower leg, initial encounter: Secondary | ICD-10-CM | POA: Insufficient documentation

## 2010-11-11 DIAGNOSIS — M25569 Pain in unspecified knee: Secondary | ICD-10-CM

## 2010-11-11 DIAGNOSIS — G547 Phantom limb syndrome without pain: Secondary | ICD-10-CM | POA: Insufficient documentation

## 2010-11-11 DIAGNOSIS — M545 Low back pain: Secondary | ICD-10-CM

## 2010-11-11 DIAGNOSIS — M7710 Lateral epicondylitis, unspecified elbow: Secondary | ICD-10-CM

## 2010-11-12 ENCOUNTER — Other Ambulatory Visit: Payer: Self-pay | Admitting: Oncology

## 2010-11-12 ENCOUNTER — Encounter (HOSPITAL_BASED_OUTPATIENT_CLINIC_OR_DEPARTMENT_OTHER): Payer: Medicare Other | Admitting: Oncology

## 2010-11-12 DIAGNOSIS — Z7901 Long term (current) use of anticoagulants: Secondary | ICD-10-CM

## 2010-11-12 DIAGNOSIS — Z86718 Personal history of other venous thrombosis and embolism: Secondary | ICD-10-CM

## 2010-11-12 DIAGNOSIS — D6859 Other primary thrombophilia: Secondary | ICD-10-CM

## 2010-11-12 LAB — PROTIME-INR: INR: 2.6 (ref 2.00–3.50)

## 2010-11-12 NOTE — Assessment & Plan Note (Signed)
Patient of Dr. Pamelia Hoit, is followed for lower extremity phantom pain after below-the-knee amputation.  She states her pain is unchanged.  She has some right elbow tenosynovitis from time to time and is not bothering her at this time.  Phantom pain is about 4 to 5, it is some times sharp, burning, and is constant.  Walking, standing, and most activities aggravate.  Rest and pain medication tend to help.  Pain is same 24 hours a day.  She walks without assistance.  She has slight limp with prosthesis.  She can walk about 15 minutes.  She drives and climb steps.  She is on disability.  REVIEW OF SYSTEMS:  Notable for the difficulties described above, otherwise unremarkable.  PAST MEDICAL HISTORY:  Unchanged.  SOCIAL HISTORY:  Unchanged.  FAMILY HISTORY:  Unchanged.  PHYSICAL EXAMINATION:  Blood pressure is 131/73, pulse 80, respirations 18, O2 sats 93% on room air.  Motor strength appears to be intact in lower extremities.  Sensation is intact.  Still does have phantom pain on that side.  Constitutionally, she is within normal limits.  She is alert and oriented x3, somewhat depressed.  No signs of aberrant behaviors.  IMPRESSION: 1. Lumbago. 2. Below-the-knee phantom pain from amputation. 3. Right tenosynovitis, transient.  PLAN: 1. Refill oxycodone 10/325 one p.o. q.i.d., 120, no refill. 2. She will follow up with Dr. Pamelia Hoit in 1 month.  She states she     already has an appointment scheduled.     Nanie Dunkleberger L. Blima Dessert Electronically Signed    RLW/MedQ D:  11/11/2010 11:40:19  T:  11/12/2010 04:54:09  Job #:  [id number]

## 2010-12-03 ENCOUNTER — Encounter
Payer: Medicare Other | Attending: Physical Medicine and Rehabilitation | Admitting: Physical Medicine and Rehabilitation

## 2010-12-03 DIAGNOSIS — M79609 Pain in unspecified limb: Secondary | ICD-10-CM

## 2010-12-03 DIAGNOSIS — S88119A Complete traumatic amputation at level between knee and ankle, unspecified lower leg, initial encounter: Secondary | ICD-10-CM | POA: Insufficient documentation

## 2010-12-03 DIAGNOSIS — M545 Low back pain, unspecified: Secondary | ICD-10-CM | POA: Insufficient documentation

## 2010-12-03 DIAGNOSIS — R209 Unspecified disturbances of skin sensation: Secondary | ICD-10-CM | POA: Insufficient documentation

## 2010-12-03 DIAGNOSIS — G894 Chronic pain syndrome: Secondary | ICD-10-CM

## 2010-12-03 NOTE — Assessment & Plan Note (Signed)
HISTORY OF PRESENT ILLNESS:  [name] is a pleasant 55 year old woman who is followed at our [facility].  She was seen last by me in April 2012 in the interim.  She has had monthly visits with nurse practitioner, Frances Finley.  Frances Finley is back in today for refill of her Percocet.  She takes 10/325 four times a day.  She has a history of chronic left leg pain. She is status post below-the-knee amputation.  Her average pain is about 6 on a scale of 10.  She reports fair relief with these medications.  She also takes Lyrica 125 mg tablet in the morning and 3 at night.  Pain is described as sharp, stabbing, and aching as well as tingling at times.  She reports fair relief with the medications.  FUNCTIONAL STATUS:  She can walk 20 minutes at a time.  She is able to climb stairs.  She does drive.  She is a high functioning individual, independent with all self-care.  She has some intermittent numbness and occasional trouble walking and tingling in the left lower extremity.  Denies bladder or bowel control problems.  Denies depression, anxiety, or suicidal ideation.  No other change in past medical, social, or family history.  PHYSICAL EXAMINATION:  VITAL SIGNS:  Today, her blood pressure is 117/61, pulse 72, respirations 14, and 95% saturation on room air. NEUROLOGIC:  She is a well-developed, well-nourished woman who does not appear in any distress.  She is oriented x3.  Speech is clear.  Affect is bright.  She is alert, cooperative, and pleasant.  Follows commands without difficulty.  Answers my questions appropriately.  Cranial nerves coordination are intact.  Her reflexes are deferred today.  Her motor strength is good in both lower extremities.  The prosthesis is removed. She has no open areas in the distal residual limb in the left lower extremity.  She has no areas of tenderness at this time as well.  There is no evidence of AP or  lateral instability of the left knee ligaments.  When with the prosthesis on, her gait was evaluated.  She has an uneven gait but is without pain.  Her gait is stable.  IMPRESSION: 1. History of lumbago, currently not a major problem at this time. 2. 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 3. History of left below-the-knee amputation. 4. Resolution of right tenosynovitis of the elbow.  PLAN:  Pill counts were assessed.  She is short of about 6 pills.  I discussed this with her.  She will continue to monitor her pill usage. Her last urine drug screen was on April 17, 2010 and was consistent. We will continue to monitor her pain medication usage.  She reports no problems with oversedation or constipation from the medications.  She finds that they do help manage her limb pain.  We discussed the use of her Lyrica, currently she is taking one in the morning and three at night.  I will have her switch this to two in the morning and two at night.  She does not need a refill on her Lyrica at this time.  She will follow up over the next 3 months with Frances Finley, nurse practitioner and I will see her back in 4 months.     Brantley Stage, M.D. Electronically Signed    DMK/MedQ D:  12/03/2010 12:13:04  T:  12/03/2010 19:27:28  Job #:  [id number]

## 2010-12-11 ENCOUNTER — Other Ambulatory Visit: Payer: Self-pay | Admitting: Oncology

## 2010-12-11 ENCOUNTER — Encounter (HOSPITAL_BASED_OUTPATIENT_CLINIC_OR_DEPARTMENT_OTHER): Payer: Medicare Other | Admitting: Oncology

## 2010-12-11 DIAGNOSIS — Z86718 Personal history of other venous thrombosis and embolism: Secondary | ICD-10-CM

## 2010-12-11 DIAGNOSIS — Z7901 Long term (current) use of anticoagulants: Secondary | ICD-10-CM

## 2010-12-11 DIAGNOSIS — D6859 Other primary thrombophilia: Secondary | ICD-10-CM

## 2010-12-11 LAB — PROTIME-INR: INR: 1.9 — ABNORMAL LOW (ref 2.00–3.50)

## 2010-12-11 LAB — CBC WITH DIFFERENTIAL/PLATELET
Basophils Absolute: 0.1 10*3/uL (ref 0.0–0.1)
EOS%: 2.7 % (ref 0.0–7.0)
HGB: 16.1 g/dL — ABNORMAL HIGH (ref 11.6–15.9)
MCH: 33.7 pg (ref 25.1–34.0)
MONO#: 0.4 10*3/uL (ref 0.1–0.9)
NEUT#: 3.5 10*3/uL (ref 1.5–6.5)
RDW: 12.6 % (ref 11.2–14.5)
WBC: 6.6 10*3/uL (ref 3.9–10.3)
lymph#: 2.5 10*3/uL (ref 0.9–3.3)

## 2010-12-19 ENCOUNTER — Other Ambulatory Visit: Payer: Self-pay | Admitting: Oncology

## 2010-12-19 ENCOUNTER — Encounter (HOSPITAL_BASED_OUTPATIENT_CLINIC_OR_DEPARTMENT_OTHER): Payer: Medicare Other | Admitting: Oncology

## 2010-12-19 DIAGNOSIS — D6859 Other primary thrombophilia: Secondary | ICD-10-CM

## 2010-12-19 DIAGNOSIS — Z7901 Long term (current) use of anticoagulants: Secondary | ICD-10-CM

## 2010-12-19 DIAGNOSIS — Z86718 Personal history of other venous thrombosis and embolism: Secondary | ICD-10-CM

## 2010-12-19 LAB — PROTIME-INR
INR: 2.5 (ref 2.00–3.50)
Protime: 30 s — ABNORMAL HIGH (ref 10.6–13.4)

## 2011-01-02 ENCOUNTER — Ambulatory Visit: Payer: Medicare Other | Admitting: Neurosurgery

## 2011-01-12 ENCOUNTER — Encounter: Payer: Medicare Other | Attending: Neurosurgery | Admitting: Neurosurgery

## 2011-01-12 DIAGNOSIS — R0789 Other chest pain: Secondary | ICD-10-CM | POA: Insufficient documentation

## 2011-01-12 DIAGNOSIS — G547 Phantom limb syndrome without pain: Secondary | ICD-10-CM

## 2011-01-12 DIAGNOSIS — M545 Low back pain: Secondary | ICD-10-CM

## 2011-01-12 DIAGNOSIS — M25529 Pain in unspecified elbow: Secondary | ICD-10-CM

## 2011-01-12 DIAGNOSIS — M25519 Pain in unspecified shoulder: Secondary | ICD-10-CM | POA: Insufficient documentation

## 2011-01-13 NOTE — Assessment & Plan Note (Signed)
Account [phone]  This is a patient Dr. Pamelia Hoit who is seen for left foot phantom pain. She has had a left BKA and was actually having some right arm and biceps pain that is radiating into the scapula.  I did explain the patient that it could be coming from the cervical spine as well as the arm.  She has no paresthesias.  She has no neck pain and states that it is radiating into the right chest area.  Her OB/GYN has got her scheduled for a mammogram due to the pain she is having, and she obviously has some concern that it may be coming from the breast.  The patient states she does have silicone implant there and she is worried about, so she is going to get the mammogram done and follow up with her OB/GYN in a few days.  She has also been treated with a Z-Pak for her cold but has not resolved and I encouraged her to let her doctor know about that.  She rates her average pain is 5.  It is a sharp stabbing constant pain with tingling and aching and general activity level is anywhere from 2 to 6. Pain is worse in the morning, daytime, and in the evening.  Sleep patterns are fair.  Pain is worse with walking, standing, and activity. Rest and medication tend to help.  She walks without assistance.  She can about 15 minutes at a time.  She climbs steps and drives.  She transfers alone.  Functionally, she is on disability.  She needs help with household duties and shopping.  REVIEW OF SYSTEMS:  Notable for the difficulties described above, otherwise within normal limits.  PAST MEDICAL HISTORY, SOCIAL HISTORY, AND FAMILY HISTORY:  Unchanged.  PHYSICAL EXAMINATION:  VITAL SIGNS:  Her blood pressure is 108/64, pulse 88, respirations 14, O2 sats 93 on room air. MUSCULOSKELETAL:  Her quad strength is good in the lower extremities. Sensation is intact in the upper and lower extremities.  She does give- way in the right biceps to pain with resistance testing but her sensation again is intact.  She  has limited range of motion, actively, and passively on that side in the right upper extremity.  Constitutionally, is within normal limits.  She is alert and oriented x3.  Her affect is bright.  ASSESSMENT: 1. History of lumbago, not bothering her at this current time. 2. History of below-knee amputation with phantom limb pain on the     left. 3. Right shoulder pain radiating into the chest concern.  Concern of     OB/GYN that may be related to the breast mammogram scheduled.  PLAN: 1. Refill Percocet 10/325 one p.o. q.i.d. p.r.n. 120 with no refill. 2. She asked about injection for shoulder after informed consent, and     the procedure was explained with risk and benefits.  We Betadine     prepped the right posterior aspect of her shoulder with __________,     we injected her with 3 mL of lidocaine, 1 mL of Depo-Medrol/20 mg     to the right AC joint.  She tolerated well.  There was no bleeding,     no blood draw back.  She was instructed to ice her shoulder     tonight.  We will see her back here in 1 month or sooner as needed.     She tolerated the injection well.  Questions were encouraged and     answered.  Kati Riggenbach L. Blima Dessert Electronically Signed    RLW/MedQ D:  01/12/2011 14:44:37  T:  01/13/2011 00:27:07  Job #:  [id number]

## 2011-01-16 ENCOUNTER — Encounter (HOSPITAL_BASED_OUTPATIENT_CLINIC_OR_DEPARTMENT_OTHER): Payer: Medicare Other | Admitting: Oncology

## 2011-01-16 ENCOUNTER — Other Ambulatory Visit: Payer: Self-pay | Admitting: Oncology

## 2011-01-16 DIAGNOSIS — Z7901 Long term (current) use of anticoagulants: Secondary | ICD-10-CM

## 2011-01-16 DIAGNOSIS — D6859 Other primary thrombophilia: Secondary | ICD-10-CM

## 2011-01-16 DIAGNOSIS — Z86718 Personal history of other venous thrombosis and embolism: Secondary | ICD-10-CM

## 2011-01-16 LAB — PROTIME-INR

## 2011-02-09 ENCOUNTER — Encounter: Payer: Medicare Other | Attending: Neurosurgery | Admitting: Neurosurgery

## 2011-02-09 DIAGNOSIS — R209 Unspecified disturbances of skin sensation: Secondary | ICD-10-CM | POA: Insufficient documentation

## 2011-02-09 DIAGNOSIS — G547 Phantom limb syndrome without pain: Secondary | ICD-10-CM

## 2011-02-09 DIAGNOSIS — M545 Low back pain: Secondary | ICD-10-CM

## 2011-02-09 DIAGNOSIS — M79609 Pain in unspecified limb: Secondary | ICD-10-CM | POA: Insufficient documentation

## 2011-02-09 DIAGNOSIS — M25519 Pain in unspecified shoulder: Secondary | ICD-10-CM | POA: Insufficient documentation

## 2011-02-10 NOTE — Assessment & Plan Note (Signed)
Account [id number].  This is a patient Dr. Leretha Finley who is seen for left phantom foot pain, status post left BKAs.  She states that she has had some arm pain from time to time.  I injected her last time she was here she did well with that.  She states she has had some numbness in her right upper extremity that is very transient.  She says that will come and last for up to 30 seconds and then go away but it is quite bothersome.  I did tell her that therapy and EMG nerve conduction study of the upper extremity were options she rates her average pain is 6.  It is sharp, burning, stabbing, tingling, and aching pain that is constant.  General activity level is 2-7 pain is worse the morning daytime in the evening sleep patterns are fair.  Pain is worse with walking standing activity rest and medications and injections tend to help.  She walks without assistance.  She can climb steps and drive.  She can walk about 15 minutes at a time.  She is on disability and needs help with household duties.  REVIEW OF SYSTEMS:  Notable for the difficulties described above otherwise within normal limits.  PAST MEDICAL, SOCIAL, AND FAMILY MEDICAL HISTORY:  Unchanged.  PHYSICAL EXAMINATION:  VITAL SIGNS: Her blood pressure is 130/61, pulse 85 respirations 18, O2 sats 93 on room air.  Motor strength and sensation are intact otherwise constitutionally she is within normal limits.  She is alert and oriented x3.  She has a slight limp.  ASSESSMENT: 1. History of lumbago, currently resolved. 2. 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 Right shoulder pain with some paresthesias occurring in the right     upper extremity.  PLAN: 1. Refill Percocet 10/325 one p.o. q.i.d. p.r.n. 120 with no refill. 2. We had decided to go and start her back in therapy and they will     the schedule that with common outpatient closest to her per her     convenience right upper extremity  strengthening and range of     motion.  We are also going to talk about a nerve conduction study     when she comes back depending on how the physical therapy helped or     did not help and if she wants to proceed, we will go ahead and plan     on ordering the upper extremity nerve conduction study to be done     here and she will review that with Dr. Dr. Pamelia Finley.  She is in     agreement with plan.  Her questions were encouraged and answered.     Frances Finley L. Frances Finley Electronically Signed    RLW/MedQ D:  02/09/2011 14:36:24  T:  02/10/2011 02:32:38  Job #:  [id number]

## 2011-02-18 ENCOUNTER — Ambulatory Visit: Payer: Medicare Other | Attending: Physical Medicine and Rehabilitation | Admitting: Physical Therapy

## 2011-02-18 DIAGNOSIS — IMO0001 Reserved for inherently not codable concepts without codable children: Secondary | ICD-10-CM | POA: Insufficient documentation

## 2011-02-18 DIAGNOSIS — M25519 Pain in unspecified shoulder: Secondary | ICD-10-CM | POA: Insufficient documentation

## 2011-02-18 DIAGNOSIS — M542 Cervicalgia: Secondary | ICD-10-CM | POA: Insufficient documentation

## 2011-02-20 ENCOUNTER — Ambulatory Visit: Payer: Medicare Other | Admitting: Physical Therapy

## 2011-02-21 ENCOUNTER — Other Ambulatory Visit: Payer: Self-pay | Admitting: Oncology

## 2011-02-21 DIAGNOSIS — D6869 Other thrombophilia: Secondary | ICD-10-CM

## 2011-02-24 ENCOUNTER — Ambulatory Visit: Payer: Medicare Other | Admitting: Physical Therapy

## 2011-02-26 ENCOUNTER — Ambulatory Visit: Payer: Medicare Other | Admitting: Physical Therapy

## 2011-03-03 ENCOUNTER — Ambulatory Visit: Payer: Medicare Other | Admitting: Physical Therapy

## 2011-03-05 ENCOUNTER — Ambulatory Visit: Payer: Medicare Other | Admitting: Physical Therapy

## 2011-03-09 ENCOUNTER — Other Ambulatory Visit (HOSPITAL_BASED_OUTPATIENT_CLINIC_OR_DEPARTMENT_OTHER): Payer: Medicare Other | Admitting: Lab

## 2011-03-09 ENCOUNTER — Other Ambulatory Visit: Payer: Self-pay | Admitting: Oncology

## 2011-03-09 DIAGNOSIS — D6859 Other primary thrombophilia: Secondary | ICD-10-CM

## 2011-03-09 DIAGNOSIS — D6869 Other thrombophilia: Secondary | ICD-10-CM

## 2011-03-09 DIAGNOSIS — Z86718 Personal history of other venous thrombosis and embolism: Secondary | ICD-10-CM

## 2011-03-09 DIAGNOSIS — Z7901 Long term (current) use of anticoagulants: Secondary | ICD-10-CM

## 2011-03-09 LAB — PROTIME-INR

## 2011-03-10 ENCOUNTER — Telehealth: Payer: Self-pay | Admitting: *Deleted

## 2011-03-10 NOTE — Telephone Encounter (Signed)
Per MD, notified Pt to "hold" coumadin for 2 days and recheck in 1 week. Pt states that she forgot to "tell the nurse"  that she has been on Bactrium for 10 days, and her PCP is RX a different abx for additional 10 days. Pt has previously been on coumadin 2.5 alternating with 5mg . Her next lab is Dec. 3rd. Will forward the message to MD for review

## 2011-03-11 ENCOUNTER — Other Ambulatory Visit: Payer: Self-pay | Admitting: Oncology

## 2011-03-11 ENCOUNTER — Telehealth: Payer: Self-pay | Admitting: *Deleted

## 2011-03-11 ENCOUNTER — Encounter
Payer: Medicare Other | Attending: Physical Medicine and Rehabilitation | Admitting: Physical Medicine and Rehabilitation

## 2011-03-11 DIAGNOSIS — S88119A Complete traumatic amputation at level between knee and ankle, unspecified lower leg, initial encounter: Secondary | ICD-10-CM | POA: Insufficient documentation

## 2011-03-11 DIAGNOSIS — D6859 Other primary thrombophilia: Secondary | ICD-10-CM

## 2011-03-11 DIAGNOSIS — G8929 Other chronic pain: Secondary | ICD-10-CM | POA: Insufficient documentation

## 2011-03-11 DIAGNOSIS — M542 Cervicalgia: Secondary | ICD-10-CM

## 2011-03-11 DIAGNOSIS — M79609 Pain in unspecified limb: Secondary | ICD-10-CM

## 2011-03-11 DIAGNOSIS — M545 Low back pain, unspecified: Secondary | ICD-10-CM | POA: Insufficient documentation

## 2011-03-11 DIAGNOSIS — M25519 Pain in unspecified shoulder: Secondary | ICD-10-CM | POA: Insufficient documentation

## 2011-03-11 DIAGNOSIS — M25529 Pain in unspecified elbow: Secondary | ICD-10-CM

## 2011-03-11 DIAGNOSIS — R209 Unspecified disturbances of skin sensation: Secondary | ICD-10-CM | POA: Insufficient documentation

## 2011-03-11 NOTE — Telephone Encounter (Signed)
Spoke with Dr. Welton Flakes, and informed her that the Pt can not come in.

## 2011-03-11 NOTE — Assessment & Plan Note (Signed)
Frances Finley is a pleasant 55 year old woman who is followed here at our [facility] for chronic pain complaints predominantly related to her left lower extremity.  She has suffered a below-knee amputation and has had chronic pain in the left limb since then.  More recently, she has been having trouble with some neck pain and radiation to the shoulder and numbness into the index finger and thumb of her right hand.  She has had an injection into the right shoulder, which helped somewhat.  She is already in physical therapy program and doing some traction and she has a TENS unit.  Her pain is about 5 on a scale of 10.  She is still having trouble with sleeping and function because of the pain in the right arm.  Pain is described as sharp, stabbing, and aching in nature, keeping her up at times.  Pain is typically worse with activities, improves with rest and medication.  She has been using Lyrica 25 mg once in the morning, 3 tablets at night, as well as Percocet 10/325 four times a day.  She is reporting fair relief with this combination currently.  FUNCTIONAL STATUS:  She is a high-level functioning individual.  She is also mentioning today that she needs a new prosthesis she is getting somewhere on the one she currently has.  The inserts are worn out.  She is concerned about breakdown of her residual limb.  Currently, she does not have any open areas, however, she is worried.  Denies problems with bowel or bladder.  Admits to some numbness and tingling especially in the right upper extremity.  Denies depression, anxiety, or suicidal ideation.  No other changes in her past medical history other than she has had recent bronchitis.  She is also on blood thinners, warfarin, and primary care manages that.  No changes in past medical, social, or family history other than what I have already mentioned.  On exam today, blood pressure is 132/78,  pulse 75, respirations 18, 96% saturation on room air.  She is a well-developed, well-nourished woman who does not appear in any distress.  She is oriented x3.  Speech is clear.  Affect is bright.  She is alert, cooperative, and pleasant. Follows commands without difficulty.  Answers my questions appropriately.  Cranial nerves, coordination are intact.  Her reflexes are 2+ at the biceps, triceps, brachioradialis, 2+ at the patellar and Achilles tendon.  Hoffmann sign is negative bilaterally.  No clonus on the right is appreciated.  She has decreased sensation in the C5 and C6 dermatomes in her right upper extremity.  Her motor strength, however, is quite good.  She has mild limitations in cervical range of motion as well.  Transitions from sitting to standing without difficulty.  Gait is relatively stable.  She has some trouble with tandem gait.  She does have a prosthesis, which makes it a little difficult for her as well.  Her lower extremity strength is good without obvious focal deficit as well.  She has relatively well-preserved shoulder range at this time. With respect to abduction, she is able to abduct to approximately 150 degrees without difficulty.  IMPRESSION: 1. Right neck and shoulder pain.  History of about 2 months currently     involved in therapy, seems to be doing well with this.  She has a     TENS unit.  She is using Lyrica.  She is still having some trouble     with  some function and sleeping at night. 2. Lumbago, currently not a major problem for her at this time, she     says it is no worse than it usually is. 3. Left lower extremity phantom limb pain.  PLAN:  Urine drug screen, which was January 12, 2011, was consistent. She forgot her medications today for pill counts.  This is the first time she has done that this year.  I have reminded her and told her the importance of bringing her medications in so that we can monitor her, she understands and will  comply.  I will write her a prescription for a new prosthesis as well as for some supplies including inserts.  She would like to try a prosthesis with mobile ankle joint.  She is a very active woman and I think she will probably do well with this.  I refilled her medication Percocet 10/325, not more than 4 per day, #120 per month, and encouraged her to continue physical therapy.  I also give her a Medrol Dosepak to see if we can calm down her left upper extremity pain, which is from a presumed irritated C5 or C6 root.  I reviewed the risks and benefits of the medications with her.  She understands and would like to proceed with this current management plan. I have answered all her questions.  I will see her back in a month.     Brantley Stage, M.D. Electronically Signed    DMK/MedQ D:  03/11/2011 13:43:51  T:  03/11/2011 14:47:12  Job #:  [id number]

## 2011-03-11 NOTE — Telephone Encounter (Signed)
Pt called left msg " I will be out of town on Friday for 2 weeks.. Please advise.

## 2011-03-11 NOTE — Telephone Encounter (Signed)
Called patient at home number left message to inform the patient of the new date and time of the appointment lab only 03-13-2011

## 2011-03-11 NOTE — Telephone Encounter (Signed)
Please have patient hold coumadin for now and recheck her INR on Friday of this week. Orders put in epic and routed to schedulers. Call patient

## 2011-03-11 NOTE — Telephone Encounter (Signed)
Please make sure consequences of not coming in for her INR checks for the next 2 weeks.

## 2011-03-12 NOTE — Telephone Encounter (Signed)
Called pt LMOVM-Per Dr.Khan,  Pt needs to have RT/INR prior to her leaving town on Friday. Pt to follow up with [facility] if she is unable to have PT/INR level checked prior to leaving town for 2 weeks. Requested pt to call back to confirm she has received this msg and confirm if she will be able to come in prior to leaving.

## 2011-03-13 ENCOUNTER — Telehealth: Payer: Self-pay | Admitting: *Deleted

## 2011-03-13 ENCOUNTER — Other Ambulatory Visit: Payer: Medicare Other | Admitting: Lab

## 2011-03-13 NOTE — Telephone Encounter (Signed)
Per MD, left message for pt  Reminding pt that it is very important to repeat pt/inr in 2 weeks. Have instructed pt to call this desk to schedule a lab appt

## 2011-03-17 ENCOUNTER — Ambulatory Visit: Payer: Medicare Other | Attending: Physical Medicine and Rehabilitation | Admitting: Physical Therapy

## 2011-03-17 DIAGNOSIS — M542 Cervicalgia: Secondary | ICD-10-CM | POA: Insufficient documentation

## 2011-03-17 DIAGNOSIS — IMO0001 Reserved for inherently not codable concepts without codable children: Secondary | ICD-10-CM | POA: Insufficient documentation

## 2011-03-17 DIAGNOSIS — M25519 Pain in unspecified shoulder: Secondary | ICD-10-CM | POA: Insufficient documentation

## 2011-03-19 ENCOUNTER — Ambulatory Visit: Payer: Medicare Other | Admitting: Physical Therapy

## 2011-03-23 ENCOUNTER — Ambulatory Visit: Payer: Medicare Other | Admitting: Physical Therapy

## 2011-03-25 ENCOUNTER — Ambulatory Visit: Payer: Medicare Other | Admitting: Physical Therapy

## 2011-03-31 ENCOUNTER — Ambulatory Visit: Payer: Medicare Other | Admitting: Physical Therapy

## 2011-04-02 ENCOUNTER — Ambulatory Visit: Payer: Medicare Other | Admitting: Physical Therapy

## 2011-04-02 NOTE — Telephone Encounter (Signed)
Frances Finley what ever happened to her

## 2011-04-06 ENCOUNTER — Other Ambulatory Visit (HOSPITAL_BASED_OUTPATIENT_CLINIC_OR_DEPARTMENT_OTHER): Payer: Medicare Other | Admitting: Lab

## 2011-04-06 ENCOUNTER — Other Ambulatory Visit: Payer: Self-pay | Admitting: Oncology

## 2011-04-06 DIAGNOSIS — Z7901 Long term (current) use of anticoagulants: Secondary | ICD-10-CM

## 2011-04-06 DIAGNOSIS — Z86718 Personal history of other venous thrombosis and embolism: Secondary | ICD-10-CM

## 2011-04-06 DIAGNOSIS — D6859 Other primary thrombophilia: Secondary | ICD-10-CM

## 2011-04-06 LAB — PROTIME-INR: Protime: 25.2 Seconds — ABNORMAL HIGH (ref 10.6–13.4)

## 2011-04-07 ENCOUNTER — Telehealth: Payer: Self-pay | Admitting: *Deleted

## 2011-04-07 ENCOUNTER — Ambulatory Visit: Payer: Medicare Other | Attending: Physical Medicine and Rehabilitation | Admitting: Physical Therapy

## 2011-04-07 ENCOUNTER — Encounter: Payer: Medicare Other | Admitting: Physical Therapy

## 2011-04-07 DIAGNOSIS — IMO0001 Reserved for inherently not codable concepts without codable children: Secondary | ICD-10-CM | POA: Insufficient documentation

## 2011-04-07 DIAGNOSIS — M25519 Pain in unspecified shoulder: Secondary | ICD-10-CM | POA: Insufficient documentation

## 2011-04-07 DIAGNOSIS — M542 Cervicalgia: Secondary | ICD-10-CM | POA: Insufficient documentation

## 2011-04-07 NOTE — Telephone Encounter (Signed)
Called pt to verify if taking Coumadin and dose. LMOVM for pt to call back with information.

## 2011-04-08 ENCOUNTER — Telehealth: Payer: Self-pay | Admitting: *Deleted

## 2011-04-08 NOTE — Telephone Encounter (Signed)
Called LMOVM for pt to call back with Coumadin dose she is current taking.

## 2011-04-08 NOTE — Telephone Encounter (Signed)
Pt called  lmovm taking 2.5mg  Coumadin 2.5mg  MWF 5mg  other days. Per MD continue same dose. Recheck labs as scheduled. Pt verbalized understanding.

## 2011-04-09 ENCOUNTER — Ambulatory Visit: Payer: Medicare Other | Admitting: Physical Therapy

## 2011-04-10 ENCOUNTER — Other Ambulatory Visit: Payer: Self-pay | Admitting: Certified Registered Nurse Anesthetist

## 2011-04-10 ENCOUNTER — Encounter
Payer: Medicare Other | Attending: Physical Medicine and Rehabilitation | Admitting: Physical Medicine and Rehabilitation

## 2011-04-10 DIAGNOSIS — M25819 Other specified joint disorders, unspecified shoulder: Secondary | ICD-10-CM | POA: Insufficient documentation

## 2011-04-10 DIAGNOSIS — S88119A Complete traumatic amputation at level between knee and ankle, unspecified lower leg, initial encounter: Secondary | ICD-10-CM | POA: Insufficient documentation

## 2011-04-10 DIAGNOSIS — M545 Low back pain, unspecified: Secondary | ICD-10-CM | POA: Insufficient documentation

## 2011-04-10 DIAGNOSIS — M542 Cervicalgia: Secondary | ICD-10-CM | POA: Insufficient documentation

## 2011-04-10 DIAGNOSIS — M25519 Pain in unspecified shoulder: Secondary | ICD-10-CM | POA: Insufficient documentation

## 2011-04-10 DIAGNOSIS — M67919 Unspecified disorder of synovium and tendon, unspecified shoulder: Secondary | ICD-10-CM | POA: Insufficient documentation

## 2011-04-10 DIAGNOSIS — M79609 Pain in unspecified limb: Secondary | ICD-10-CM

## 2011-04-10 DIAGNOSIS — M719 Bursopathy, unspecified: Secondary | ICD-10-CM | POA: Insufficient documentation

## 2011-04-10 DIAGNOSIS — I2699 Other pulmonary embolism without acute cor pulmonale: Secondary | ICD-10-CM

## 2011-04-10 DIAGNOSIS — S43429A Sprain of unspecified rotator cuff capsule, initial encounter: Secondary | ICD-10-CM

## 2011-04-10 MED ORDER — WARFARIN SODIUM 5 MG PO TABS: ORAL_TABLET | ORAL | Status: DC

## 2011-04-11 NOTE — Assessment & Plan Note (Signed)
[name] is a pleasant 55 year old woman who is followed here at [facility].  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  She has been involved in a physical therapy program recently for neck and shoulder pain.  She reports that she has been having some trouble abducting her shoulder.  Her average pain is between 5 and 6 on a scale of 10.  It is described as intermittent, constant, sharp, burning, stabbing and aching in nature.  Pain is worse during the morning, daytime and evening not as much at night.  Sleep is fair.  Pain is worse with walking, standing, and abducting left shoulder.  Pain improves with rest.  Pacing her activities and medication.  She reports fair relief with current meds.  Medications [facility] through [facility] include Percocet 10/325 one p.o. q.i.d. p.r.n. and Lyrica 25 mg one in the morning and 3 at nightly.  Functional status.  She can walk about 20 minutes at a time.  She is able to climb stairs and drive.  She is independent, overall high functioning individual.  No change in review of systems, past medical, social, or family history.  We are waiting for her new prosthesis to be fabricated and she has been following up physical therapy to improve neck range of motion as well as shoulder motion.  She has also been using cervical traction.  She is up 22 pounds using it 3 times a day for 20 minutes.  On exam today; her blood pressure is 139/77, pulse 97, respirations 16, 92% saturated on room air.  She is a well-developed, well-nourished woman who does not appear in any distress.  She is oriented x3.  Speech is clear.  Affect is bright.  She is alert, cooperative, and pleasant. Follows commands without difficulty.  Answers my questions appropriately.  Cranial nerves,, coordination are grossly intact. Reflexes are 2+ at biceps, triceps, brachioradialis,  2+ at the patellar and Achilles tendon on the right, 2+ at the left Achilles tendon.  No new sensory deficits are appreciated.  Motor strength is good in both upper and lower extremities.  She has mild limitations cervical range of motion.  She has significantly decreased range of motion in her left compared to the right.  She complains of pain with abduction, internal and external rotation, shoulder are also limited.  IMPRESSION: 1. Left shoulder rotator cuff tendonitis/impingement. 2. Right neck pain, shoulder pain, may be neuropathic component here     as well. 3. Lumbago, currently not a major problem. 4. Left lower extremity phantom limb pain.  PLAN:  She does not need a refill on her Lyrica.  She continues to take 25 mg in the morning and three 25 mg tablets at nightly.  She is also using Percocet 4 times a day.  Her pill counts are appropriate.  Her last urine drug screen was consistent which was January 12, 2011.  Regarding her left shoulder, I reviewed treatment options.  She is currently in physical therapy.  She is on Lyrica and Percocet.  We discussed the possibility of trialing a left shoulder injection and she would like to pursue this.  I reviewed the risks and benefits of this treatment option with her.  She would like to proceed.  A consent was signed prior to injection for her left shoulder.  I inject 1 mL of Kenalog and 5 mL of 1% lidocaine using posterolateral approach  to the left shoulder.  The area was prepped with Betadine and swabbed with alcohol after the area was marked.  She tolerated the injection without problems.  She reported overall improvement in range of motion and pain in the shoulder has improved as well postinjection.  I gave her a postinjection instructions and I will follow her back up next month.  I have answered all her questions.  I have given her prescription for her Percocet.     Brantley Stage, M.D. Electronically  Signed    DMK/MedQ D:  04/10/2011 14:14:51  T:  04/11/2011 01:03:07  Job #:  [id number]

## 2011-04-28 ENCOUNTER — Encounter: Payer: Medicare Other | Admitting: Physical Therapy

## 2011-05-02 ENCOUNTER — Telehealth: Payer: Self-pay | Admitting: Oncology

## 2011-05-02 NOTE — Telephone Encounter (Signed)
lmonvm for pt re mthly appts for jan thru may.  Schedule mailed today.

## 2011-05-08 ENCOUNTER — Encounter: Payer: Medicare Other | Attending: Neurosurgery | Admitting: Neurosurgery

## 2011-05-08 DIAGNOSIS — M719 Bursopathy, unspecified: Secondary | ICD-10-CM | POA: Insufficient documentation

## 2011-05-08 DIAGNOSIS — M545 Low back pain, unspecified: Secondary | ICD-10-CM | POA: Insufficient documentation

## 2011-05-08 DIAGNOSIS — M25529 Pain in unspecified elbow: Secondary | ICD-10-CM

## 2011-05-08 DIAGNOSIS — M542 Cervicalgia: Secondary | ICD-10-CM

## 2011-05-08 DIAGNOSIS — M67919 Unspecified disorder of synovium and tendon, unspecified shoulder: Secondary | ICD-10-CM | POA: Insufficient documentation

## 2011-05-08 DIAGNOSIS — G547 Phantom limb syndrome without pain: Secondary | ICD-10-CM

## 2011-05-08 DIAGNOSIS — M25519 Pain in unspecified shoulder: Secondary | ICD-10-CM | POA: Insufficient documentation

## 2011-05-09 NOTE — Assessment & Plan Note (Signed)
This is a patient of Dr. Pamelia Hoit, is seen for left foot phantom pain as well as some shoulder pain and neck pain, as well as lumbago.  She rates her average pain at 5.  It is a sharp, burning, stabbing, aching, constant pain.  General activity level is 5-7.  The pain is worse during the morning, daytime, in the evening.  Sleep patterns are fair. Walking, standing, and activity aggravate; rest, pacing, medication helps.  She can walk without assistance.  She can walk up to 15 minutes at a time.  She does climb steps and drives.  She is on disability, needs some help with household duties.  REVIEW OF SYSTEMS:  Notable for difficulties as described above, otherwise unremarkable.  No suicidal thoughts or aberrant behaviors. Last pill count UDS consistent.  Past medical history, social history, and family history unchanged.  PHYSICAL EXAMINATION:  VITAL SIGNS:  Blood pressure is 124/74, pulse 75, respirations 14, O2 sats 92 on room air. CONSTITUTIONAL:  Within normal limits.  She is alert and oriented x3. She does have a limp due to her prosthesis. MUSCULOSKELETAL:  Iliopsoas motor strength in the lower extremities and upper motor strength is good and intact as well as her sensation.  IMPRESSION: 1. History of left shoulder rotator cuff tendinitis. 2. Cervicalgia as well as right shoulder pain. 3. Lumbago. 4. Left lower extremity phantom pain.  PLAN:  Refill Percocet 10/325 one p.o. q.i.d. p.r.n. 120 with no refill. Her questions were encouraged and answered.  She will see Dr. Pamelia Hoit in 1 month.     Zyon Rosser L. Blima Dessert Electronically Signed    RLW/MedQ D:  05/08/2011 14:11:14  T:  05/09/2011 05:25:07  Job #:  [id number]

## 2011-05-11 ENCOUNTER — Other Ambulatory Visit: Payer: Self-pay | Admitting: Oncology

## 2011-05-11 ENCOUNTER — Other Ambulatory Visit (HOSPITAL_BASED_OUTPATIENT_CLINIC_OR_DEPARTMENT_OTHER): Payer: Medicare Other | Admitting: Lab

## 2011-05-11 DIAGNOSIS — Z86718 Personal history of other venous thrombosis and embolism: Secondary | ICD-10-CM

## 2011-05-11 DIAGNOSIS — D6859 Other primary thrombophilia: Secondary | ICD-10-CM

## 2011-05-11 DIAGNOSIS — Z7901 Long term (current) use of anticoagulants: Secondary | ICD-10-CM

## 2011-05-11 LAB — PROTIME-INR
INR: 1.3 — ABNORMAL LOW (ref 2.00–3.50)
Protime: 15.6 Seconds — ABNORMAL HIGH (ref 10.6–13.4)

## 2011-05-13 ENCOUNTER — Telehealth: Payer: Self-pay | Admitting: *Deleted

## 2011-05-13 NOTE — Telephone Encounter (Signed)
PER NR, NOTIFIED PT TO INCREASE COUMADIN TO 5MG  DAILY AND REPEAT LABS AS SCHEDULED

## 2011-06-05 ENCOUNTER — Encounter: Payer: Medicare Other | Admitting: Physical Medicine and Rehabilitation

## 2011-06-08 ENCOUNTER — Other Ambulatory Visit: Payer: Medicare Other | Admitting: Lab

## 2011-07-06 ENCOUNTER — Other Ambulatory Visit: Payer: Medicare Other | Admitting: Lab

## 2011-08-03 ENCOUNTER — Other Ambulatory Visit: Payer: Medicare Other | Admitting: Lab

## 2011-09-07 ENCOUNTER — Other Ambulatory Visit: Payer: Medicare Other | Admitting: Lab

## 2011-09-11 ENCOUNTER — Ambulatory Visit: Payer: Medicare Other | Admitting: Oncology

## 2012-01-26 ENCOUNTER — Other Ambulatory Visit: Payer: Self-pay | Admitting: Medical Oncology

## 2013-01-04 ENCOUNTER — Inpatient Hospital Stay (HOSPITAL_COMMUNITY)
Admission: EM | Admit: 2013-01-04 | Discharge: 2013-01-10 | DRG: 065 | Disposition: A | Discharge status: Rehabilitation Facility | Payer: Medicare Other | Attending: Neurological Surgery | Admitting: Neurological Surgery

## 2013-01-04 ENCOUNTER — Encounter (HOSPITAL_COMMUNITY): Payer: Self-pay | Admitting: *Deleted

## 2013-01-04 DIAGNOSIS — I6200 Nontraumatic subdural hemorrhage, unspecified: Principal | ICD-10-CM | POA: Diagnosis present

## 2013-01-04 DIAGNOSIS — D6859 Other primary thrombophilia: Secondary | ICD-10-CM | POA: Diagnosis present

## 2013-01-04 DIAGNOSIS — R4701 Aphasia: Secondary | ICD-10-CM | POA: Diagnosis present

## 2013-01-04 DIAGNOSIS — I10 Essential (primary) hypertension: Secondary | ICD-10-CM | POA: Diagnosis present

## 2013-01-04 DIAGNOSIS — R4789 Other speech disturbances: Secondary | ICD-10-CM | POA: Diagnosis not present

## 2013-01-04 DIAGNOSIS — N390 Urinary tract infection, site not specified: Secondary | ICD-10-CM | POA: Diagnosis present

## 2013-01-04 DIAGNOSIS — Z7901 Long term (current) use of anticoagulants: Secondary | ICD-10-CM

## 2013-01-04 DIAGNOSIS — S88119A Complete traumatic amputation at level between knee and ankle, unspecified lower leg, initial encounter: Secondary | ICD-10-CM

## 2013-01-04 DIAGNOSIS — F29 Unspecified psychosis not due to a substance or known physiological condition: Secondary | ICD-10-CM | POA: Diagnosis present

## 2013-01-04 DIAGNOSIS — Z86718 Personal history of other venous thrombosis and embolism: Secondary | ICD-10-CM

## 2013-01-04 DIAGNOSIS — K219 Gastro-esophageal reflux disease without esophagitis: Secondary | ICD-10-CM | POA: Diagnosis present

## 2013-01-04 DIAGNOSIS — Z86711 Personal history of pulmonary embolism: Secondary | ICD-10-CM

## 2013-01-04 DIAGNOSIS — Z87891 Personal history of nicotine dependence: Secondary | ICD-10-CM

## 2013-01-04 DIAGNOSIS — E039 Hypothyroidism, unspecified: Secondary | ICD-10-CM | POA: Diagnosis present

## 2013-01-04 HISTORY — DX: Other pulmonary embolism without acute cor pulmonale: I26.99

## 2013-01-04 HISTORY — DX: Acute embolism and thrombosis of unspecified deep veins of unspecified lower extremity: I82.409

## 2013-01-04 HISTORY — DX: Essential (primary) hypertension: I10

## 2013-01-04 HISTORY — DX: Other specified coagulation defects: D68.8

## 2013-01-04 HISTORY — DX: Embolism and thrombosis of unspecified parts of aorta: I74.10

## 2013-01-04 HISTORY — DX: Hypothyroidism, unspecified: E03.9

## 2013-01-04 HISTORY — DX: Reserved for inherently not codable concepts without codable children: IMO0001

## 2013-01-04 HISTORY — DX: Iron deficiency anemia, unspecified: D50.9

## 2013-01-04 HISTORY — DX: Gastro-esophageal reflux disease without esophagitis: K21.9

## 2013-01-04 LAB — CBC
Hemoglobin: 17 g/dL — ABNORMAL HIGH (ref 12.0–15.0)
MCH: 32 pg (ref 26.0–34.0)
MCHC: 36.3 g/dL — ABNORMAL HIGH (ref 30.0–36.0)
MCV: 88.1 fL (ref 78.0–100.0)
Platelets: 378 10*3/uL (ref 150–400)
RBC: 5.31 MIL/uL — ABNORMAL HIGH (ref 3.87–5.11)

## 2013-01-04 LAB — COMPREHENSIVE METABOLIC PANEL
CO2: 30 mEq/L (ref 19–32)
Calcium: 9.9 mg/dL (ref 8.4–10.5)
Creatinine, Ser: 0.51 mg/dL (ref 0.50–1.10)
GFR calc Af Amer: >90 mL/min (ref >90)
GFR calc non Af Amer: >90 mL/min (ref >90)
Glucose, Bld: 101 mg/dL — ABNORMAL HIGH (ref 70–99)

## 2013-01-04 LAB — GLUCOSE, CAPILLARY: Glucose-Capillary: 114 mg/dL — ABNORMAL HIGH (ref 70–99)

## 2013-01-04 LAB — PROTIME-INR: Prothrombin Time: 23.3 seconds — ABNORMAL HIGH (ref 11.6–15.2)

## 2013-01-04 NOTE — ED Notes (Addendum)
Family reported that pt.has been lethargic / somnolent / dizzy for past several days with poor appetite and has not taken her medications for the past several days  .

## 2013-01-05 ENCOUNTER — Encounter (HOSPITAL_COMMUNITY): Payer: Self-pay | Admitting: Radiology

## 2013-01-05 ENCOUNTER — Emergency Department (HOSPITAL_COMMUNITY): Payer: Medicare Other

## 2013-01-05 LAB — RAPID URINE DRUG SCREEN, HOSP PERFORMED
Barbiturates: NOT DETECTED
Tetrahydrocannabinol: NOT DETECTED

## 2013-01-05 LAB — TYPE AND SCREEN: Antibody Screen: NEGATIVE

## 2013-01-05 LAB — URINALYSIS, ROUTINE W REFLEX MICROSCOPIC
Nitrite: NEGATIVE
Specific Gravity, Urine: 1.024 (ref 1.005–1.030)
Urobilinogen, UA: 1 mg/dL (ref 0.0–1.0)

## 2013-01-05 LAB — PROTIME-INR: INR: 1.14 (ref 0.00–1.49)

## 2013-01-05 LAB — URINE MICROSCOPIC-ADD ON

## 2013-01-05 LAB — MRSA PCR SCREENING: MRSA by PCR: NEGATIVE

## 2013-01-05 MED ORDER — LABETALOL HCL 5 MG/ML IV SOLN: 10.0000 mg | INTRAVENOUS | Status: DC | PRN

## 2013-01-05 MED ORDER — HYDROCHLOROTHIAZIDE 25 MG PO TABS
25.0000 mg | ORAL_TABLET | Freq: Every day | ORAL | Status: DC
  Administered 2013-01-06 – 2013-01-10 (×5): 25 mg via ORAL
  Filled 2013-01-05 (×6): qty 1

## 2013-01-05 MED ORDER — GABAPENTIN 600 MG PO TABS
600.0000 mg | ORAL_TABLET | Freq: Two times a day (BID) | ORAL | Status: DC
  Administered 2013-01-06 – 2013-01-10 (×9): 600 mg via ORAL
  Filled 2013-01-05 (×12): qty 1

## 2013-01-05 MED ORDER — SODIUM CHLORIDE 0.9 % IV SOLN
INTRAVENOUS | Status: DC
  Administered 2013-01-05 – 2013-01-06 (×3): via INTRAVENOUS

## 2013-01-05 MED ORDER — ACETAMINOPHEN 325 MG PO TABS
650.0000 mg | ORAL_TABLET | ORAL | Status: DC | PRN
  Administered 2013-01-07 – 2013-01-09 (×5): 650 mg via ORAL
  Filled 2013-01-05 (×5): qty 2

## 2013-01-05 MED ORDER — SODIUM CHLORIDE 0.9 % IV SOLN
500.0000 mg | Freq: Two times a day (BID) | INTRAVENOUS | Status: DC
  Administered 2013-01-05 – 2013-01-08 (×7): 500 mg via INTRAVENOUS
  Filled 2013-01-05 (×8): qty 5

## 2013-01-05 MED ORDER — AZITHROMYCIN 250 MG PO TABS: 250.0000 mg | ORAL_TABLET | Freq: Every day | ORAL | Status: DC

## 2013-01-05 MED ORDER — ACETAMINOPHEN 650 MG RE SUPP: 650.0000 mg | RECTAL | Status: DC | PRN

## 2013-01-05 MED ORDER — MORPHINE SULFATE 2 MG/ML IJ SOLN
1.0000 mg | INTRAMUSCULAR | Status: DC | PRN
  Administered 2013-01-06 – 2013-01-08 (×3): 2 mg via INTRAVENOUS
  Filled 2013-01-05 (×3): qty 1

## 2013-01-05 MED ORDER — SENNOSIDES-DOCUSATE SODIUM 8.6-50 MG PO TABS
1.0000 | ORAL_TABLET | Freq: Two times a day (BID) | ORAL | Status: DC
  Administered 2013-01-06 – 2013-01-10 (×8): 1 via ORAL
  Filled 2013-01-05 (×10): qty 1

## 2013-01-05 MED ORDER — LEVOTHYROXINE SODIUM 100 MCG PO TABS
100.0000 ug | ORAL_TABLET | Freq: Every day | ORAL | Status: DC
  Administered 2013-01-06 – 2013-01-10 (×5): 100 ug via ORAL
  Filled 2013-01-05 (×9): qty 1

## 2013-01-05 MED ORDER — ALBUTEROL SULFATE HFA 108 (90 BASE) MCG/ACT IN AERS: 2.0000 | INHALATION_SPRAY | Freq: Four times a day (QID) | RESPIRATORY_TRACT | Status: DC | PRN

## 2013-01-05 MED ORDER — PANTOPRAZOLE SODIUM 40 MG IV SOLR
40.0000 mg | Freq: Every day | INTRAVENOUS | Status: DC
  Administered 2013-01-05 – 2013-01-07 (×3): 40 mg via INTRAVENOUS
  Filled 2013-01-05 (×5): qty 40

## 2013-01-05 MED ORDER — FUROSEMIDE 40 MG PO TABS
40.0000 mg | ORAL_TABLET | Freq: Two times a day (BID) | ORAL | Status: DC
  Administered 2013-01-06 – 2013-01-10 (×10): 40 mg via ORAL
  Filled 2013-01-05 (×14): qty 1

## 2013-01-05 MED ORDER — DEXTROSE 5 % IV SOLN
5.0000 mg | Freq: Once | INTRAVENOUS | Status: AC
  Administered 2013-01-05: 5 mg via INTRAVENOUS
  Filled 2013-01-05: qty 0.5

## 2013-01-05 NOTE — Progress Notes (Signed)
0400 received pt from ED with 2 units of FFP running simultaneous in the left and right AC.The unit numbers were [id number] and [id number].  ED nurse was unable to scan blood and release blood in the epic system, but was able to obtain from blood bank and started the administration prior to bringing pt to 3M12. I was also unable to release the blood in epic and blood bank was unable to resolve issue. Both units ended at 0500. Vitals have been recorded in epic.

## 2013-01-05 NOTE — ED Provider Notes (Signed)
CSN: [id number]     Arrival date & time 01/04/13  2103 History   First MD Initiated Contact with Patient 01/04/13 2236     Chief Complaint  Patient presents with  . Altered Mental Status   (Consider location/radiation/quality/duration/timing/severity/associated sxs/prior Treatment) HPI Comments: Patient is a 57 year old female with a past medical history of hypertension, previous DVT and PE, and SLE who presents with a 4 day history of altered mental status. [name] husband is at the bedside and provides the history. Symptoms started gradually and progressively worsened since the onset. [name] husband reports the patient has been "making no sense" when she talks. She appears confused according to the family. He denies any injury or recent medication changes. Patient has never had this before. No aggravating/alleviating factors. No associated symptoms.    Past Medical History  Diagnosis Date  . GERD (gastroesophageal reflux disease)   . DVT (deep venous thrombosis)   . PE (pulmonary embolism)   . SLE inhibitor syndrome   . Aortic thromboembolism   . Hypothyroidism   . Hypertension   . Iron deficiency anemia    Past Surgical History  Procedure Laterality Date  . Below knee leg amputation  2003  . Cholecystectomy    . Abdominal hysterectomy     No family history on file. History  Substance Use Topics  . Smoking status: Former Games developer  . Smokeless tobacco: Not on file  . Alcohol Use: Yes   OB History   Grav Para Term Preterm Abortions TAB SAB Ect Mult Living                 Review of Systems  Constitutional:       Altered mental status  All other systems reviewed and are negative.    Allergies  Review of patient's allergies indicates no known allergies.  Home Medications   Current Outpatient Rx  Name  Route  Sig  Dispense  Refill  . albuterol (PROVENTIL HFA;VENTOLIN HFA) 108 (90 BASE) MCG/ACT inhaler   Inhalation   Inhale 2 puffs into the lungs every 6 (six)  hours as needed for wheezing.         . diazepam (VALIUM) 5 MG tablet   Oral   Take 5 mg by mouth every 6 (six) hours as needed for anxiety.         Marland Kitchen esomeprazole (NEXIUM) 40 MG capsule   Oral   Take 40 mg by mouth 2 (two) times daily.          Marland Kitchen gabapentin (NEURONTIN) 600 MG tablet   Oral   Take 600 mg by mouth 2 (two) times daily.         Marland Kitchen HYDROCHLOROTHIAZIDE PO   Oral   Take 25 mg by mouth daily.         Marland Kitchen levothyroxine (SYNTHROID, LEVOTHROID) 100 MCG tablet   Oral   Take 100 mcg by mouth daily before breakfast.         . oxyCODONE (OXYCONTIN) 10 MG 12 hr tablet   Oral   Take 20 mg by mouth 3 (three) times daily.         . potassium chloride (K-DUR,KLOR-CON) 10 MEQ tablet   Oral   Take 10 mEq by mouth daily.         Marland Kitchen senna (SENOKOT) 8.6 MG tablet   Oral   Take 1 tablet by mouth daily.         . sennosides-docusate sodium (SENOKOT-S) 8.6-50 MG tablet  Oral   Take 1 tablet by mouth 2 (two) times daily.         . simvastatin (ZOCOR) 20 MG tablet   Oral   Take 20 mg by mouth every evening.         . triamcinolone cream (KENALOG) 0.1 %   Topical   Apply 1 application topically 2 (two) times daily.         Marland Kitchen warfarin (COUMADIN) 5 MG tablet   Oral   Take 5 mg by mouth daily.         Marland Kitchen azithromycin (ZITHROMAX) 250 MG tablet   Oral   Take 250 mg by mouth daily. Two tablets on day one and 1 tablet for the next 4 days.         . furosemide (LASIX) 40 MG tablet   Oral   Take 40 mg by mouth 2 (two) times daily. For 6 days.          BP 139/76  Pulse 82  Temp(Src) 98.8 F (37.1 C) (Oral)  Resp 16  SpO2 97% Physical Exam  Nursing note and vitals reviewed. Constitutional: She appears well-developed and well-nourished. No distress.  HENT:  Head: Normocephalic and atraumatic.  Eyes: Conjunctivae and EOM are normal. Pupils are equal, round, and reactive to light. No scleral icterus.  Neck: Normal range of motion.   Cardiovascular: Normal rate and regular rhythm.  Exam reveals no gallop and no friction rub.   No murmur heard. Pulmonary/Chest: Effort normal and breath sounds normal. She has no wheezes. She has no rales. She exhibits no tenderness.  Abdominal: Soft. She exhibits no distension. There is no tenderness. There is no rebound and no guarding.  Musculoskeletal: Normal range of motion.  Neurological:  Patient not oriented to time, place or person. Patient responds inappropriately to questions and commands.    Skin: Skin is warm and dry.  Psychiatric: She has a normal mood and affect. Her behavior is normal.    ED Course  Procedures (including critical care time) Labs Review Labs Reviewed  CBC - Abnormal; Notable for the following:    WBC 12.2 (*)    RBC 5.31 (*)    Hemoglobin 17.0 (*)    HCT 46.8 (*)    MCHC 36.3 (*)    All other components within normal limits  COMPREHENSIVE METABOLIC PANEL - Abnormal; Notable for the following:    Sodium 132 (*)    Chloride 87 (*)    Glucose, Bld 101 (*)    ALT 40 (*)    All other components within normal limits  URINALYSIS, ROUTINE W REFLEX MICROSCOPIC - Abnormal; Notable for the following:    Color, Urine AMBER (*)    APPearance TURBID (*)    Hgb urine dipstick MODERATE (*)    Bilirubin Urine MODERATE (*)    Ketones, ur >80 (*)    Protein, ur 30 (*)    Leukocytes, UA MODERATE (*)    All other components within normal limits  GLUCOSE, CAPILLARY - Abnormal; Notable for the following:    Glucose-Capillary 114 (*)    All other components within normal limits  PROTIME-INR - Abnormal; Notable for the following:    Prothrombin Time 23.3 (*)    INR 2.15 (*)    All other components within normal limits  URINE RAPID DRUG SCREEN (HOSP PERFORMED) - Abnormal; Notable for the following:    Benzodiazepines POSITIVE (*)    All other components within normal limits  URINE MICROSCOPIC-ADD ON -  Abnormal; Notable for the following:    Bacteria, UA MANY  (*)    All other components within normal limits  URINE CULTURE  ACETAMINOPHEN LEVEL  PREPARE FRESH FROZEN PLASMA  TYPE AND SCREEN   Imaging Review Ct Head Wo Contrast  01/05/2013   *RADIOLOGY REPORT*  Clinical Data: Altered mental status  CT HEAD WITHOUT CONTRAST  Technique:  Contiguous axial images were obtained from the base of the skull through the vertex without contrast.  Comparison: None.  Findings: Positive for acute left subdural hemorrhage.  The hemorrhage is most prominent in the left parieto-occipital region. Subdural hemorrhage measures up to 13 mm in thickness posteriorly. Acute subdural blood also layers over the tentorium especially on the left. Suspect a small amount of subdural hemorrhage along the falx cerebral posteriorly (images 25 and 26).  There is a smaller volume of subdural hemorrhage on the right, focally at the level of the posterior right frontal region on image number 26 (measures up to 5 mm in thickness), and in the right parietal region focally on images 18-21, where it measures up to 11 mm in thickness.  The ventricles are normal in size.  Negative for midline shift.  No evidence of acute cortically based infarction.  No skull fracture is identified.  Small amount of fluid in the left mastoid air cells.  The visualized paranasal sinuses are clear. Soft tissues of the orbits are symmetric.  No focal scalp abnormality is seen.  IMPRESSION: Acute bilateral subdural hematomas, right greater than left. Critical Value/emergent results were called by telephone at the time of interpretation on 01/05/2013 at 1:13 am to [facility], [place], who verbally acknowledged these results.   Original Report Authenticated By: Britta Mccreedy, M.D.    MDM   1. Subdural hemorrhage     1:19 AM Labs unremarkable. Urinalysis shows UTI. [name] CT scan of head shows subdural hemorrhage. Patient is on Coumadin for previous DVT but her INR is 2.15. [name] family denies any head trauma.  Vitals stable and patient afebrile. I spoke with neurosurgery who will see the patient. Patient will have 2 units FFP and 5 mg IV vitamin Daleen Snook, PA-C 01/05/13 0144

## 2013-01-05 NOTE — Progress Notes (Signed)
Utilization Review Completed.Frances Finley T9/08/2012  

## 2013-01-05 NOTE — Progress Notes (Signed)
Occupational Therapy Evaluation Patient Details Name: Frances Finley MRN: [id number] DOB: March 26, 1956 Today's Date: 01/05/2013 Time: 4098-1191 OT Time Calculation (min): 24 min  OT Assessment / Plan / Recommendation History of present illness   57 year old white female with a history of DVT and PE has been on Coumadin for 10 years, who was brought to the ED this evening by her family with a 2-4 day history of progressive change in mental status. The family denies any known trauma. There has been a change in her responsiveness her ability to communicate. The patient is unable to cooperate with the history and physical. The symptoms have been somewhat progressive. CT scan of the head showed bilateral acute subdural hematomas a neurosurgical evaluation was requested. INR was 2.15.    Clinical Impression   PTA, pt independent with ADL and mod I with  mobility with use of LLE prothesis( L BKA). Eval limited by lethargy. Pt perseverating at times and demonstrates aphasia. Inconsistently following 1 step commands. Opens eyes, but squinting and says that she can't see well.  Family present and assisted with eval. Pt required max A with bed mobility and max to total A with all ADL at this time. Pt may benefit form CIR prior to return home with husband. Will continue to assess as pt progresses. Very supportive family who can provide 24/7 care. Pt will benefit from skilled OT services to facilitate D/C to next venue due to below deficits.    OT Assessment  Patient needs continued OT Services    Follow Up Recommendations  CIR    Barriers to Discharge   husband can provide 247 assistancwe  Equipment Recommendations  None recommended by OT    Recommendations for Other Services Rehab consult  Frequency  Min 3X/week    Precautions / Restrictions Precautions Precautions: Fall Restrictions Weight Bearing Restrictions: No   Pertinent Vitals/Pain no apparent distress     ADL  Eating/Feeding: Other  (comment) (able to hold cup and bring to mouth) Grooming: Maximal assistance Where Assessed - Grooming: Supported sitting Upper Body Bathing: Maximal assistance Where Assessed - Upper Body Bathing: Supported sitting Transfers/Ambulation Related to ADLs: not assessed at this time. Will need +2 ADL Comments: currently total A for ADL due to lethargy    OT Diagnosis: Generalized weakness;Cognitive deficits;Disturbance of vision;Acute pain;Apraxia;Altered mental status  OT Problem List: Decreased strength;Decreased activity tolerance;Impaired balance (sitting and/or standing);Impaired vision/perception;Decreased cognition;Decreased safety awareness;Decreased knowledge of use of DME or AE;Decreased knowledge of precautions;Cardiopulmonary status limiting activity;Impaired sensation;Obesity;Impaired UE functional use OT Treatment Interventions: Self-care/ADL training;Therapeutic exercise;DME and/or AE instruction;Therapeutic activities;Cognitive remediation/compensation;Visual/perceptual remediation/compensation;Patient/family education;Balance training   OT Goals(Current goals can be found in the care plan section) Acute Rehab OT Goals Patient Stated Goal: unable to state OT Goal Formulation: With family Time For Goal Achievement: 01/19/13 Potential to Achieve Goals: Good  Visit Information  Last OT Received On: 01/05/13 Assistance Needed: +2       Prior Functioning     Home Living Family/patient expects to be discharged to:: Private residence Living Arrangements: Spouse/significant other Available Help at Discharge: Family;Available 24 hours/day Type of Home: House Home Access: Stairs to enter [employer] of Steps: 3 Entrance Stairs-Rails: Can reach both Home Layout: One level Home Equipment: Walker - 2 wheels;Tub bench;Bedside commode;Cane - single point Prior Function Level of Independence: Independent;Independent with assistive device(s)  (prostheticLLE) Communication Communication: Receptive difficulties;Expressive difficulties Dominant Hand: Right         Vision/Perception Vision - History Baseline Vision: No visual deficits Vision -  Assessment Eye Alignment: Impaired (comment) Vision Assessment: Vision impaired - to be further tested in functional context Additional Comments: observed minimal dysconjugate gaze. Unable to assess due to lethargy Perception Perception: Not tested Praxis Praxis: Impaired Praxis Impairment Details: Perseveration Praxis-Other Comments: will further assess   Cognition  Cognition Arousal/Alertness: Lethargic Behavior During Therapy: Flat affect Overall Cognitive Status: Impaired/Different from baseline Area of Impairment: Orientation;Attention;Following commands;Safety/judgement;Awareness;Problem solving;Memory Orientation Level: Disoriented to;Place;Time;Situation Current Attention Level: Focused Following Commands: Follows one step commands inconsistently Safety/Judgement: Decreased awareness of safety;Decreased awareness of deficits Awareness: Intellectual Problem Solving: Slow processing;Decreased initiation;Difficulty sequencing;Requires verbal cues;Requires tactile cues    Extremity/Trunk Assessment Upper Extremity Assessment Upper Extremity Assessment: Generalized weakness Lower Extremity Assessment Lower Extremity Assessment: Generalized weakness LLE Deficits / Details: pt with left BKA, ROM and strength knee and hip WFL, has prosthesis Cervical / Trunk Assessment Cervical / Trunk Assessment: Normal     Mobility Bed Mobility Bed Mobility: Rolling Right;Rolling Left Rolling Right: 2: Max assist Rolling Left: 2: Max assist Details for Bed Mobility Assistance: increased assistanc eposibly due to lethargy this pm     Exercise     Balance Balance Balance Assessed: Yes Static Sitting Balance Static Sitting - Balance Support: Bilateral upper extremity  supported;Feet supported Static Sitting - Level of Assistance: 4: Min assist   End of Session OT - End of Session Activity Tolerance: Patient tolerated treatment well Patient left: in bed;with call bell/phone within reach;with family/visitor present Nurse Communication: Mobility status  GO     Meili Kleckley,HILLARY 01/05/2013, 6:05 PM [facility], OTR/L  [phone] 01/05/2013

## 2013-01-05 NOTE — Progress Notes (Signed)
Physical Therapy Evaluation Patient Details Name: Frances Finley MRN: [id number] DOB: 1955/08/22 Today's Date: 01/05/2013 Time: 4098-1191 PT Time Calculation (min): 18 min  PT Assessment / Plan / Recommendation History of Present Illness    57 year old female who is on Coumadin with an INR of 2.15, now with bilateral acute subdural hematomas, left greater than right. No midline shift. Blood layers over the tentorium. At this point there is no indication for surgical intervention. Need to reverse her coagulopathy.   Clinical Impression  Pt admitted with bil SDH. Pt currently with functional limitations due to the deficits listed below (see PT Problem List). Hopeful that once pt is more awake, she will progress well and will go home with family and HHPT f/u.  Will follow and make recommendations once pt more participatory.  Pt evaluation limited by patient's lethargy and decr participation. Pt will benefit from skilled PT to increase their independence and safety with mobility to allow discharge to the venue listed below.     PT Assessment  Patient needs continued PT services    Follow Up Recommendations  Home health PT;Supervision/Assistance - 24 hour                Equipment Recommendations  Other (comment) (TBA)         Frequency Min 3X/week    Precautions / Restrictions Precautions Precautions: Fall Restrictions Weight Bearing Restrictions: No   Pertinent Vitals/Pain VSS, no pain      Mobility  Bed Mobility Bed Mobility: Rolling Right;Right Sidelying to Sit;Sitting - Scoot to [place] of Bed Rolling Right: 3: Mod assist Right Sidelying to Sit: 1: +2 Total assist;HOB flat Right Sidelying to Sit: Patient Percentage: 60% Sitting - Scoot to Edge of Bed: 3: Mod assist Details for Bed Mobility Assistance: Pt resistive to get to EOB.  Once pt got 1/2 way to EOB, pt began to lie back down and stated she felt sick.  Therefore, assited pt back to bed and notified nursing.    Transfers Transfers: Not assessed Ambulation/Gait Ambulation/Gait Assistance: Not tested (comment) Stairs: No Wheelchair Mobility Wheelchair Mobility: No         PT Diagnosis: Generalized weakness  PT Problem List: Decreased activity tolerance;Decreased balance;Decreased mobility;Decreased knowledge of use of DME;Decreased safety awareness PT Treatment Interventions: DME instruction;Gait training;Functional mobility training;Therapeutic activities;Therapeutic exercise;Balance training;Patient/family education;Stair training     PT Goals(Current goals can be found in the care plan section) Acute Rehab PT Goals Patient Stated Goal: to be independent per husband PT Goal Formulation: With family Time For Goal Achievement: 01/19/13 Potential to Achieve Goals: Good  Visit Information  Last PT Received On: 01/05/13 Assistance Needed: +2       Prior Functioning  Home Living Family/patient expects to be discharged to:: Private residence Living Arrangements: Spouse/significant other Available Help at Discharge: Family;Available 24 hours/day Type of Home: House Home Access: Stairs to enter [employer] of Steps: 3 Entrance Stairs-Rails: Can reach both Home Layout: One level Home Equipment: Walker - 2 wheels Prior Function Level of Independence: Independent Communication Communication: No difficulties Dominant Hand: Right    Cognition  Cognition Arousal/Alertness: Lethargic Behavior During Therapy: Flat affect Overall Cognitive Status: Impaired/Different from baseline Area of Impairment: Orientation;Attention;Following commands;Safety/judgement;Awareness;Problem solving Orientation Level: Disoriented to;Place;Time;Situation Current Attention Level: Focused Following Commands: Follows one step commands inconsistently;Follows one step commands with increased time Safety/Judgement: Decreased awareness of safety;Decreased awareness of deficits Problem Solving: Slow  processing;Decreased initiation;Difficulty sequencing;Requires verbal cues;Requires tactile cues    Extremity/Trunk Assessment Upper Extremity Assessment Upper  Extremity Assessment: Defer to OT evaluation Lower Extremity Assessment Lower Extremity Assessment: Generalized weakness;LLE deficits/detail LLE Deficits / Details: pt with left BKA, ROM and strength knee and hip WFL, has prosthesis Cervical / Trunk Assessment Cervical / Trunk Assessment: Normal   Balance Balance Balance Assessed: Yes Static Sitting Balance Static Sitting - Balance Support: Bilateral upper extremity supported;Feet supported Static Sitting - Level of Assistance: 4: Min assist Static Sitting - Comment/# of Minutes: 1  End of Session PT - End of Session Activity Tolerance: Patient limited by fatigue;Patient limited by lethargy Patient left: in bed;with call bell/phone within reach;with family/visitor present Nurse Communication: Mobility status       INGOLD,Tashonna Descoteaux 01/05/2013, 12:14 PM  [facility] Acute Rehabilitation [phone] (pager)

## 2013-01-05 NOTE — Clinical Documentation Improvement (Signed)
THIS DOCUMENT IS NOT A PERMANENT PART OF THE MEDICAL RECORD  Please update your documentation with the medical record to reflect your response to this query. If you need help knowing how to do this please call [phone].  01/05/13  Dear Dr. Yetta Barre,   Notes in ED Note and H&P patient's condition described as "AMS". In the Coding world this term is nonspecific and low weighted. Please clarify "AMS" to more clearly illustrate this patient's severity of illness and risk of mortality. Thank you.  Possible Clinical Conditions?  - Encephalopathy                      Acute                      Chronic - Traumatic brain injury - Other Condition (please specify)   Reviewed:  no additional documentation provided  Thank You,  Beverley Fiedler RN BSN Clinical Documentation Specialist: [phone] Health Information Management [place]

## 2013-01-05 NOTE — Progress Notes (Signed)
Rehab Admissions Coordinator Note:  Patient was screened by Clois Dupes for appropriateness for an Inpatient Acute Rehab Consult.  At this time, we are recommending Inpatient Rehab consult.  Clois Dupes 01/05/2013, 6:13 PM  I can be reached at [phone].

## 2013-01-05 NOTE — Progress Notes (Signed)
INITIAL NUTRITION ASSESSMENT  DOCUMENTATION CODES Per approved criteria  -Obesity Unspecified   INTERVENTION:  Diet advancement per SLP.  Will follow-up to add supplements as needed when mental status improves.  NUTRITION DIAGNOSIS: Inadequate oral intake related to decreased alertness as evidenced by diet just advanced.   Goal: Intake to meet >90% of estimated nutrition needs.  Monitor:  PO intake, swallowing function, labs, weight trend.  Reason for Assessment: MST=2  57 y.o. female  Admitting Dx: SDH  ASSESSMENT: Patient was brought to the ED this evening by her family with a 2-4 day history of progressive change in mental status, without any known trauma. Discussed patient in ICU rounds today. Patient unable to provide any history. Nutrition focused physical exam completed.  No muscle or subcutaneous fat depletion noticed. Patient S/P bedside swallow evaluation with SLP this AM; diet advancing to Dysphagia 3 with thin liquids (only feed patient when fully alert). Suspect intake will be poor until mental status improves.   Height: Ht Readings from Last 1 Encounters:  01/05/13 5\' 9"  (1.753 m)    Weight: Wt Readings from Last 1 Encounters:  01/05/13 209 lb 8 oz (95.029 kg)    Ideal Body Weight: 62 kg  % Ideal Body Weight: 153%  Wt Readings from Last 10 Encounters:  01/05/13 209 lb 8 oz (95.029 kg)    Usual Body Weight: unknown  BMI:  Body mass index is 30.92 kg/(m^2). class 1 obesity  Estimated Nutritional Needs: Kcal: 1700-1900 Protein: 95-115 gm Fluid: 1.7-1.9 L  Skin: no problems  Diet Order: Dysphagia 3 with thin liquids  EDUCATION NEEDS: -Education not appropriate at this time   Intake/Output Summary (Last 24 hours) at 01/05/13 1111 Last data filed at 01/05/13 1100  Gross per 24 hour  Intake 851.25 ml  Output      0 ml  Net 851.25 ml    Last BM: 9/4   Labs:   Recent Labs Lab 01/04/13 2125  NA 132*  K 3.5  CL 87*  CO2 30  BUN  12  CREATININE 0.51  CALCIUM 9.9  GLUCOSE 101*    CBG (last 3)   Recent Labs  01/04/13 2112  GLUCAP 114*    Scheduled Meds: . furosemide  40 mg Oral BID  . gabapentin  600 mg Oral BID  . hydrochlorothiazide  25 mg Oral Daily  . levETIRAcetam  500 mg Intravenous Q12H  . levothyroxine  100 mcg Oral QAC breakfast  . pantoprazole (PROTONIX) IV  40 mg Intravenous QHS  . senna-docusate  1 tablet Oral BID    Continuous Infusions: . sodium chloride 75 mL/hr at 01/05/13 1100    Past Medical History  Diagnosis Date  . GERD (gastroesophageal reflux disease)   . DVT (deep venous thrombosis)   . PE (pulmonary embolism)   . SLE inhibitor syndrome   . Aortic thromboembolism   . Hypothyroidism   . Hypertension   . Iron deficiency anemia     Past Surgical History  Procedure Laterality Date  . Below knee leg amputation  2003  . Cholecystectomy    . Abdominal hysterectomy      Joaquin Courts, RD, LDN, CNSC Pager [phone] After Hours Pager [phone]

## 2013-01-05 NOTE — Progress Notes (Signed)
Patient ID: Frances Finley, female   DOB: [birth year], 56 y.o.   MRN: [id number] Much more awake this morning moving all extremities equally and has more verbal and more appropriate output. Still some aphasia. Pupils equal. INR pending. 2 units of FFP have been given.

## 2013-01-05 NOTE — Evaluation (Signed)
Clinical/Bedside Swallow Evaluation Patient Details  Name: Frances Finley MRN: [id number] Date of Birth: October 19, 1955  Today's Date: 01/05/2013 Time: 1027-1051 SLP Time Calculation (min): 24 min  Past Medical History:  Past Medical History  Diagnosis Date  . GERD (gastroesophageal reflux disease)   . DVT (deep venous thrombosis)   . PE (pulmonary embolism)   . SLE inhibitor syndrome   . Aortic thromboembolism   . Hypothyroidism   . Hypertension   . Iron deficiency anemia    Past Surgical History:  Past Surgical History  Procedure Laterality Date  . Below knee leg amputation  2003  . Cholecystectomy    . Abdominal hysterectomy     HPI:  57 year old female who is on Coumadin with an INR of 2.15, now with bilateral acute subdural hematomas, left greater than right. No midline shift. Blood layers over the tentorium. At this point there is no indication for surgical intervention. Need to reverse her coagulopathy.   Assessment / Plan / Recommendation Clinical Impression  Pt roused with max stimulation, then sustained attention to POs for brief periods with max tactile cues for self feeding. Pt consumed thin liquids without evidence of aspiration and masticated solids when feeding herself without difficutly. Given typically decreased arousal, will recommend a soft diet with mod assist from RN or NT to give hand over hand assist. If pt is not alert enough to hold a cup and take sips herself, do not feed. SLP will f/u tomorrow for lingusitic eval and diet check. Pt did have some grammatically correct automatic phrases related to context, but not related to verbal commands or questions. Appears to have primarily recpeptive deficits.     Aspiration Risk  Mild    Diet Recommendation Dysphagia 3 (Mechanical Soft);Thin liquid   Liquid Administration via: Cup;Straw Medication Administration: Whole meds with liquid Supervision: Patient able to self feed;Full supervision/cueing for compensatory  strategies Compensations: Slow rate;Small sips/bites Postural Changes and/or Swallow Maneuvers: Seated upright 90 degrees    Other  Recommendations Oral Care Recommendations: Oral care BID   Follow Up Recommendations  Inpatient Rehab    Frequency and Duration min 2x/week  2 weeks   Pertinent Vitals/Pain NA    SLP Swallow Goals Patient will utilize recommended strategies during swallow to increase swallowing safety with: Minimal assistance Swallow Study Goal #2 - Progress: Progressing toward goal   Swallow Study Prior Functional Status       General HPI: 57 year old female who is on Coumadin with an INR of 2.15, now with bilateral acute subdural hematomas, left greater than right. No midline shift. Blood layers over the tentorium. At this point there is no indication for surgical intervention. Need to reverse her coagulopathy. Type of Study: Bedside swallow evaluation Diet Prior to this Study: NPO Temperature Spikes Noted: No Respiratory Status: Room air History of Recent Intubation: No Behavior/Cognition: Confused;Requires cueing Oral Cavity - Dentition: Adequate natural dentition Self-Feeding Abilities: Able to feed self;Needs assist Patient Positioning: Upright in bed Baseline Vocal Quality: Clear Volitional Cough: Cognitively unable to elicit Volitional Swallow: Unable to elicit    Oral/Motor/Sensory Function Overall Oral Motor/Sensory Function: Other (comment) (no evidence of focal weakness, but does nto follow comamnds)   Ice Chips     Thin Liquid Thin Liquid: Within functional limits Presentation: Cup;Straw;Self Fed    Nectar Thick Nectar Thick Liquid: Not tested   Honey Thick Honey Thick Liquid: Not tested   Puree Puree: Within functional limits Presentation: Spoon Other Comments: minimal trials, pts said "thats terrible!"  Solid   GO    Solid: Within functional limits Presentation: Self Fed      Harlon Ditty, MA CCC-SLP [phone]   Debbe Crumble, Riley Nearing 01/05/2013,10:59 AM

## 2013-01-05 NOTE — ED Notes (Signed)
FFp Units [id number] and [id number] started at 50 ml /hr. Unable to release FFP into blood admin flow sheet Blood bank aware and Charge nurse assisting with FFP admin.  FFP units x2 verified by Arlice Colt

## 2013-01-05 NOTE — H&P (Signed)
Reason for Consult:SDH Referring Physician: EDP  Frances Finley is an 57 y.o. female.   HPI:  57 year old white female with a history of DVT and PE has been on Coumadin for 10 years, who was brought to the ED this evening by her family with a 2-4 day history of progressive change in mental status. The family denies any known trauma. There has been a change in her responsiveness her ability to communicate. The patient is unable to cooperate with the history and physical. The symptoms have been somewhat progressive. CT scan of the head showed bilateral acute subdural hematomas a neurosurgical evaluation was requested. INR was 2.15.  Past Medical History  Diagnosis Date  . GERD (gastroesophageal reflux disease)   . DVT (deep venous thrombosis)   . PE (pulmonary embolism)   . SLE inhibitor syndrome   . Aortic thromboembolism   . Hypothyroidism   . Hypertension   . Iron deficiency anemia     Past Surgical History  Procedure Laterality Date  . Below knee leg amputation  2003  . Cholecystectomy    . Abdominal hysterectomy      No Known Allergies  History  Substance Use Topics  . Smoking status: Former Games developer  . Smokeless tobacco: Not on file  . Alcohol Use: Yes    No family history on file.   Review of Systems  Positive ROS: Unable to obtain  All other systems have been reviewed and were otherwise negative with the exception of those mentioned in the HPI and as above.  Objective: Vital signs in last 24 hours: Temp:  [98.8 F (37.1 C)] 98.8 F (37.1 C) (09/03 2114) Pulse Rate:  [64-83] 66 (09/04 0045) Resp:  [16] 16 (09/03 2114) BP: (104-152)/(60-96) 104/60 mmHg (09/04 0045) SpO2:  [95 %-97 %] 97 % (09/04 0045)  General Appearance: Lethargic, no distress, appears stated age Head: Normocephalic, without obvious abnormality, atraumatic Eyes: PERRL 5 to 3 B, conjunctiva/corneas clear, gaze conjugate     Ears: Normal TM's and external ear canals, both ears Neck: Supple,  symmetrical, trachea midline Back: Symmetric Lungs:  respirations unlabored Heart: Regular rate and rhythm Abdomen: Soft,  Extremities: Status post left BKA, with artificial limb in place Skin: Skin color, texture, turgor normal, no rashes or lesions  NEUROLOGIC:   Mental status: Lethargic but arousable, opens eyes spontaneously, decreased attention span, unable to test Memory and fund of knowledge, appears aphasic as she would not follow commands and had some perseveration of speech Motor Exam - grossly normal, normal tone and bulk, seems to move all extremities equally Sensory Exam - unable to adequately test Reflexes: symmetric, no pathologic reflexes, No Hoffman's, No clonus Coordination - unable to adequately test Gait - not tested Balance - unable to adequately test Cranial Nerves: I: smell Not tested  II: visual acuity  OS:na  OD: na  II: visual fields  unable to test   II: pupils Equal, round, reactive to light  III,VII: ptosis None  III,IV,VI: extraocular muscles  Full ROM  V: mastication Normal  V: facial light touch sensation  Normal  V,VII: corneal reflex  Present  VII: facial muscle function - upper  Normal  VII: facial muscle function - lower Normal  VIII: hearing Not tested  IX: soft palate elevation  Normal  IX,X: gag reflex Present  XI: trapezius strength  5/5  XI: sternocleidomastoid strength 5/5  XI: neck flexion strength  5/5  XII: tongue strength  Normal    Data Review Lab Results  Component Value Date   WBC 12.2* 01/04/2013   HGB 17.0* 01/04/2013   HCT 46.8* 01/04/2013   MCV 88.1 01/04/2013   PLT 378 01/04/2013   Lab Results  Component Value Date   NA 132* 01/04/2013   K 3.5 01/04/2013   CL 87* 01/04/2013   CO2 30 01/04/2013   BUN 12 01/04/2013   CREATININE 0.51 01/04/2013   GLUCOSE 101* 01/04/2013   Lab Results  Component Value Date   INR 2.15* 01/04/2013   PROTIME 15.6* 05/11/2011    Radiology: Ct Head Wo Contrast  01/05/2013   *RADIOLOGY REPORT*  Clinical  Data: Altered mental status  CT HEAD WITHOUT CONTRAST  Technique:  Contiguous axial images were obtained from the base of the skull through the vertex without contrast.  Comparison: None.  Findings: Positive for acute left subdural hemorrhage.  The hemorrhage is most prominent in the left parieto-occipital region. Subdural hemorrhage measures up to 13 mm in thickness posteriorly. Acute subdural blood also layers over the tentorium especially on the left. Suspect a small amount of subdural hemorrhage along the falx cerebral posteriorly (images 25 and 26).  There is a smaller volume of subdural hemorrhage on the right, focally at the level of the posterior right frontal region on image number 26 (measures up to 5 mm in thickness), and in the right parietal region focally on images 18-21, where it measures up to 11 mm in thickness.  The ventricles are normal in size.  Negative for midline shift.  No evidence of acute cortically based infarction.  No skull fracture is identified.  Small amount of fluid in the left mastoid air cells.  The visualized paranasal sinuses are clear. Soft tissues of the orbits are symmetric.  No focal scalp abnormality is seen.  IMPRESSION: Acute bilateral subdural hematomas, right greater than left. Critical Value/emergent results were called by telephone at the time of interpretation on 01/05/2013 at 1:13 am to [facility], [place], who verbally acknowledged these results.   Original Report Authenticated By: Britta Mccreedy, M.D.     Assessment/Plan: 57 year old female who is on Coumadin with an INR of 2.15, now with bilateral acute subdural hematomas, left greater than right. No midline shift. Blood layers over the tentorium. At this point there is no indication for surgical intervention. Need to reverse her coagulopathy. Admit to the neurosurgical ICU, FFP, neuro checks, repeat head CT tomorrow or if has mental status changes. The real difficulty we determining when and if she can  resume anticoagulation and the potential risks and benefits of holding anticoagulation versus resuming anticoagulation and her risk of further thrombosis. She may benefit from vena cava filter.   JONES,DAVID S 01/05/2013 1:50 AM

## 2013-01-06 ENCOUNTER — Inpatient Hospital Stay (HOSPITAL_COMMUNITY): Payer: Medicare Other

## 2013-01-06 DIAGNOSIS — S065X9A Traumatic subdural hemorrhage with loss of consciousness of unspecified duration, initial encounter: Secondary | ICD-10-CM

## 2013-01-06 LAB — PREPARE FRESH FROZEN PLASMA: Unit division: 0

## 2013-01-06 LAB — BASIC METABOLIC PANEL
CO2: 24 mEq/L (ref 19–32)
Calcium: 8.7 mg/dL (ref 8.4–10.5)
Creatinine, Ser: 0.44 mg/dL — ABNORMAL LOW (ref 0.50–1.10)
GFR calc non Af Amer: >90 mL/min (ref >90)
Glucose, Bld: 98 mg/dL (ref 70–99)

## 2013-01-06 LAB — URINE CULTURE: Culture: NO GROWTH

## 2013-01-06 MED ORDER — POTASSIUM CHLORIDE IN NACL 20-0.9 MEQ/L-% IV SOLN
INTRAVENOUS | Status: DC
  Administered 2013-01-06 – 2013-01-09 (×6): via INTRAVENOUS
  Filled 2013-01-06 (×11): qty 1000

## 2013-01-06 MED ORDER — POTASSIUM CHLORIDE 10 MEQ/100ML IV SOLN
10.0000 meq | INTRAVENOUS | Status: AC
  Administered 2013-01-06 (×3): 10 meq via INTRAVENOUS
  Filled 2013-01-06 (×2): qty 100

## 2013-01-06 MED ORDER — POTASSIUM CHLORIDE 10 MEQ/100ML IV SOLN
INTRAVENOUS | Status: AC
  Administered 2013-01-06: 10 meq via INTRAVENOUS
  Filled 2013-01-06: qty 100

## 2013-01-06 MED ORDER — SULFAMETHOXAZOLE-TRIMETHOPRIM 400-80 MG PO TABS
1.0000 | ORAL_TABLET | Freq: Two times a day (BID) | ORAL | Status: DC
  Administered 2013-01-06 – 2013-01-10 (×9): 1 via ORAL
  Filled 2013-01-06 (×10): qty 1

## 2013-01-06 NOTE — Evaluation (Signed)
Speech Language Pathology Evaluation Patient Details Name: Kalanie Fewell MRN: [id number] DOB: Dec 05, 1955 Today's Date: 01/06/2013 Time: 4098-1191 SLP Time Calculation (min): 32 min  Problem List:  Patient Active Problem List   Diagnosis Date Noted  . HEMOCHROMATOSIS 07/31/2008  . ANXIETY 07/31/2008  . 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 SINUSITIS, ACUTE 07/31/2008  . GERD 07/31/2008  . OTHER&UNSPEC NONSPECIFIC IMMUNOLOGICAL FINDINGS 07/31/2008  . HEMOCHROMATOSIS 07/31/2008   Past Medical History:  Past Medical History  Diagnosis Date  . GERD (gastroesophageal reflux disease)   . DVT (deep venous thrombosis)   . PE (pulmonary embolism)   . SLE inhibitor syndrome   . Aortic thromboembolism   . Hypothyroidism   . Hypertension   . Iron deficiency anemia    Past Surgical History:  Past Surgical History  Procedure Laterality Date  . Below knee leg amputation  2003  . Cholecystectomy    . Abdominal hysterectomy     HPI:  57 year old female who is on Coumadin with an INR of 2.15, now with bilateral acute subdural hematomas, left greater than right. No midline shift. Blood layers over the tentorium. At this point there is no indication for surgical intervention. Need to reverse her coagulopathy.   Assessment / Plan / Recommendation Clinical Impression  Pt presents with a severe receptive aphasia complicated by minimal attention to external stimuli. Pt responds to internal distractors (pain, wants/needs), and will somtimes participate in functional tasks with tactile cues to initiate (washing face, brushing hair). She does not comprehend single words, but does respond well to prosodic cues (rising intontation in question will elicit an incorrect Y/N response). Her verbal expression is totally unrelated to question, she perseverates on her wants needs, but is typically grammatically appropriate. Ocassionally she will have some neolgisims  indicative of a  moderate expressive aphasia. Hopeful that when she begins to attend to external environment her expressive langauge will improve. For now suspect melodic intonation and music therapy may be best method to access pts language function. SLP will follow. Strongly recommend CIR at d/c.     SLP Assessment  Patient needs continued Speech Lanaguage Pathology Services    Follow Up Recommendations  Inpatient Rehab    Frequency and Duration min 2x/week  2 weeks   Pertinent Vitals/Pain NA   SLP Goals  SLP Goals Potential to Achieve Goals: Good SLP Goal #1: Pt will sustain attention to basic functional task for 30 seconds with max contextual and tactile cues.  SLP Goal #1 - Progress: Progressing toward goal SLP Goal #2: Pt will verbalize during melodic intonation task or song with max prosodic cues.  SLP Goal #2 - Progress: Progressing toward goal SLP Goal #3: Pt will maintain eye contact with speaker for 15 seconds to attempt communication task with max assist.  SLP Goal #3 - Progress: Progressing toward goal  SLP Evaluation Prior Functioning  Cognitive/Linguistic Baseline: Within functional limits Type of Home: House  Lives With: Spouse Available Help at Discharge: Family;Available 24 hours/day   Cognition  Overall Cognitive Status: Impaired/Different from baseline Arousal/Alertness: Awake/alert Orientation Level: Other (comment) (UTA due to aphasia, likely disoriented) Attention: Focused Focused Attention: Impaired Focused Attention Impairment: Verbal basic;Functional basic (all higher level function impaired by attention deficits) Behaviors: Restless    Comprehension  Auditory Comprehension Overall Auditory Comprehension: Impaired Yes/No Questions: Impaired Basic Biographical Questions: 0-25% accurate Commands: Impaired One Step Basic Commands: 0-24% accurate Conversation: Simple Interfering Components: Attention;Pain;Visual impairments Visual  Recognition/Discrimination Discrimination: Exceptions to [facility] Common Objects: Unable to indentify  Reading Comprehension Reading Status: Not tested    Expression Verbal Expression Overall Verbal Expression: Impaired Initiation: No impairment Automatic Speech: Social Response Level of Generative/Spontaneous Verbalization: Phrase Repetition: Impaired Level of Impairment: Word level Naming: Impairment Common Objects: Unable to indentify Verbal Errors: Neologisms;Perseveration;Language of confusion;Not aware of errors Interfering Components: Attention Effective Techniques: Melodic intonation   Oral / Motor Oral Motor/Sensory Function Overall Oral Motor/Sensory Function: Appears within functional limits for tasks assessed Motor Speech Overall Motor Speech: Appears within functional limits for tasks assessed   GO     Eashan Schipani, Riley Nearing 01/06/2013, 10:01 AM

## 2013-01-06 NOTE — Progress Notes (Signed)
I met with pt's family briefly at bedside. Patient would be a candidate for an inpt rehab admission pending [facility] insurance approval when pt medically ready. I will follow up Monday. [phone]

## 2013-01-06 NOTE — Progress Notes (Signed)
Patient ID: Frances Finley, female   DOB: Jul 16, 1955, 57 y.o.   MRN: [id number] Subjective: Patient more awake, certainly more verbal  Objective: Vital signs in last 24 hours: Temp:  [97.8 F (36.6 C)-99.4 F (37.4 C)] 98.7 F (37.1 C) (09/05 0802) Pulse Rate:  [44-72] 63 (09/05 0800) Resp:  [16-30] 24 (09/05 0800) BP: (126-164)/(58-80) 142/70 mmHg (09/05 0800) SpO2:  [95 %-100 %] 98 % (09/05 0800) Weight:  [95.029 kg (209 lb 8 oz)] 95.029 kg (209 lb 8 oz) (09/04 1031)  Intake/Output from previous day: 09/04 0701 - 09/05 0700 In: 2010 [I.V.:1800; IV Piggyback:210] Out: 1800 [Urine:1800] Intake/Output this shift: Total I/O In: 75 [I.V.:75] Out: -   Neurologic: Grossly normal except for improving dysphasia and confusion  Lab Results: Lab Results  Component Value Date   WBC 12.2* 01/04/2013   HGB 17.0* 01/04/2013   HCT 46.8* 01/04/2013   MCV 88.1 01/04/2013   PLT 378 01/04/2013   Lab Results  Component Value Date   INR 1.14 01/05/2013   PROTIME 15.6* 05/11/2011   BMET Lab Results  Component Value Date   NA 134* 01/06/2013   K 2.7* 01/06/2013   CL 96 01/06/2013   CO2 24 01/06/2013   GLUCOSE 98 01/06/2013   BUN 10 01/06/2013   CREATININE 0.44* 01/06/2013   CALCIUM 8.7 01/06/2013    Studies/Results: Ct Head Wo Contrast  01/06/2013   *RADIOLOGY REPORT*  Clinical Data: Evaluate subdural hematoma.  CT HEAD WITHOUT CONTRAST  Technique:  Contiguous axial images were obtained from the base of the skull through the vertex without contrast.  Comparison: Head CT from yesterday.  Findings: No acute osseous or soft tissue abnormality in the interim.  Bilateral acute cerebral convexity subdural hematomas:  The left hematoma is more extensive, with small components present along the falx, convexity at the vertex, left tentorium, and left posterior parietal and occipital regions.  The thickness component is in the occipital region, deforming the underlying brain, measuring 15 mm in maximal thickness.  Just  anteriorly, there is a more extensive portion which measures 11 mm in thickness.  On the right, the density is not as high.  A globular component in the right temporal parietal region continues to measures 1 centimeter. Slight rightward shift is stable from prior, 3 mm in maximal.  No evidence of acute ischemia or hydrocephalus.  IMPRESSION: Unchanged size and extent of bilateral cerebral convexity subdural hematomas, left larger than right. Unchanged mild rightward midline shift.   Original Report Authenticated By: Tiburcio Pea   Ct Head Wo Contrast  01/05/2013   *RADIOLOGY REPORT*  Clinical Data: Altered mental status  CT HEAD WITHOUT CONTRAST  Technique:  Contiguous axial images were obtained from the base of the skull through the vertex without contrast.  Comparison: None.  Findings: Positive for acute left subdural hemorrhage.  The hemorrhage is most prominent in the left parieto-occipital region. Subdural hemorrhage measures up to 13 mm in thickness posteriorly. Acute subdural blood also layers over the tentorium especially on the left. Suspect a small amount of subdural hemorrhage along the falx cerebral posteriorly (images 25 and 26).  There is a smaller volume of subdural hemorrhage on the right, focally at the level of the posterior right frontal region on image number 26 (measures up to 5 mm in thickness), and in the right parietal region focally on images 18-21, where it measures up to 11 mm in thickness.  The ventricles are normal in size.  Negative for midline shift.  No evidence of acute cortically based infarction.  No skull fracture is identified.  Small amount of fluid in the left mastoid air cells.  The visualized paranasal sinuses are clear. Soft tissues of the orbits are symmetric.  No focal scalp abnormality is seen.  IMPRESSION: Acute bilateral subdural hematomas, right greater than left. Critical Value/emergent results were called by telephone at the time of interpretation on 01/05/2013  at 1:13 am to [facility], [place], who verbally acknowledged these results.   Original Report Authenticated By: Britta Mccreedy, M.D.    Assessment/Plan: Improving, CT stable, replace K, and treat UTI. PT/OT   LOS: 2 days    Frances Finley S 01/06/2013, 9:29 AM

## 2013-01-06 NOTE — Consult Note (Signed)
Physical Medicine and Rehabilitation Consult Reason for Consult: Subdural hematoma Referring Physician: Dr. Marikay Alar   HPI: Frances Finley is a 57 y.o. 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 Admitted 01/05/2013 with progressive change in mental status denies any recent trauma. Cranial CT scan showed acute bilateral subdural hematomas, right greater than left. Followup neurosurgery Dr. Marikay Alar. INR on admission of 2.15 and was reversed. Advise conservative care of subdural hematoma with serial followup cranial CT scans that remained stable. Keppra was added for seizure prophylaxis. Physical occupational therapy evaluations completed an ongoing. Recommendations have been made for physical medicine rehabilitation consult to consider inpatient rehabilitation services.   Review of Systems  Unable to perform ROS: language   Past Medical History  Diagnosis Date  . GERD (gastroesophageal reflux disease)   . DVT (deep venous thrombosis)   . PE (pulmonary embolism)   . SLE inhibitor syndrome   . Aortic thromboembolism   . Hypothyroidism   . Hypertension   . Iron deficiency anemia    Past Surgical History  Procedure Laterality Date  . Below knee leg amputation  2003  . Cholecystectomy    . Abdominal hysterectomy     No family history on file. Social History:  reports that she has quit smoking. She does not have any smokeless tobacco history on file. She reports that  drinks alcohol. She reports that she does not use illicit drugs. Allergies: No Known Allergies Medications Prior to Admission  Medication Sig Dispense Refill  . albuterol (PROVENTIL HFA;VENTOLIN HFA) 108 (90 BASE) MCG/ACT inhaler Inhale 2 puffs into the lungs every 6 (six) hours as needed for wheezing.      . cyclobenzaprine (FLEXERIL) 10 MG tablet Take 10 mg by mouth 3 (three) times daily as needed for muscle spasms.      . diazepam (VALIUM) 5 MG tablet Take 5 mg  by mouth every 6 (six) hours as needed for anxiety.      Marland Kitchen esomeprazole (NEXIUM) 40 MG capsule Take 40 mg by mouth 2 (two) times daily.       Marland Kitchen gabapentin (NEURONTIN) 600 MG tablet Take 600 mg by mouth 2 (two) times daily.      Marland Kitchen HYDROCHLOROTHIAZIDE PO Take 25 mg by mouth daily.      Marland Kitchen levothyroxine (SYNTHROID, LEVOTHROID) 100 MCG tablet Take 100 mcg by mouth daily before breakfast.      . oxyCODONE (OXYCONTIN) 10 MG 12 hr tablet Take 20 mg by mouth 3 (three) times daily.      . potassium chloride (K-DUR,KLOR-CON) 10 MEQ tablet Take 10 mEq by mouth daily.      Marland Kitchen senna (SENOKOT) 8.6 MG tablet Take 1 tablet by mouth daily.      . sennosides-docusate sodium (SENOKOT-S) 8.6-50 MG tablet Take 1 tablet by mouth 2 (two) times daily.      . simvastatin (ZOCOR) 20 MG tablet Take 20 mg by mouth every evening.      . triamcinolone cream (KENALOG) 0.1 % Apply 1 application topically 2 (two) times daily.      . Vitamin D, Ergocalciferol, (DRISDOL) 50000 UNITS CAPS capsule Take 50,000 Units by mouth every 7 (seven) days. Family not sure of what day she takes it      . warfarin (COUMADIN) 5 MG tablet Take 5 mg by mouth daily.      Marland Kitchen azithromycin (ZITHROMAX) 250 MG tablet Take 250 mg by mouth daily. Two tablets on day one  and 1 tablet for the next 4 days.      . furosemide (LASIX) 40 MG tablet Take 40 mg by mouth 2 (two) times daily. For 6 days.        Home: Home Living Family/patient expects to be discharged to:: Private residence Living Arrangements: Spouse/significant other Available Help at Discharge: Family;Available 24 hours/day Type of Home: House Home Access: Stairs to enter [employer] of Steps: 3 Entrance Stairs-Rails: Can reach both Home Layout: One level Home Equipment: Walker - 2 wheels;Tub bench;Bedside commode;Cane - single point  Functional History:   Functional Status:  Mobility: Bed Mobility Bed Mobility: Rolling Right;Rolling Left Rolling Right: 2: Max assist Rolling  Left: 2: Max assist Right Sidelying to Sit: 1: +2 Total assist;HOB flat Right Sidelying to Sit: Patient Percentage: 60% Sitting - Scoot to Edge of Bed: 3: Mod assist Transfers Transfers: Not assessed Ambulation/Gait Ambulation/Gait Assistance: Not tested (comment) Stairs: No Wheelchair Mobility Wheelchair Mobility: No  ADL: ADL Eating/Feeding: Other (comment) (able to hold cup and bring to mouth) Grooming: Maximal assistance Where Assessed - Grooming: Supported sitting Upper Body Bathing: Maximal assistance Where Assessed - Upper Body Bathing: Supported sitting Transfers/Ambulation Related to ADLs: not assessed at this time. Will need +2 ADL Comments: currently total A for ADL due to lethargy  Cognition: Cognition Overall Cognitive Status: Impaired/Different from baseline Orientation Level: Disoriented X4 Cognition Arousal/Alertness: Lethargic Behavior During Therapy: Flat affect Overall Cognitive Status: Impaired/Different from baseline Area of Impairment: Orientation;Attention;Following commands;Safety/judgement;Awareness;Problem solving;Memory Orientation Level: Disoriented to;Place;Time;Situation Current Attention Level: Focused Following Commands: Follows one step commands inconsistently Safety/Judgement: Decreased awareness of safety;Decreased awareness of deficits Awareness: Intellectual Problem Solving: Slow processing;Decreased initiation;Difficulty sequencing;Requires verbal cues;Requires tactile cues  Blood pressure 145/69, pulse 52, temperature 98.9 F (37.2 C), temperature source Oral, resp. rate 26, height 5\' 9"  (1.753 m), weight 95.029 kg (209 lb 8 oz), SpO2 99.00%. Physical Exam  Vitals reviewed. HENT:  Head: Normocephalic.  Eyes: EOM are normal.  No nystagmus  Neck: Normal range of motion. Neck supple. No thyromegaly present.  Cardiovascular: Normal rate and regular rhythm.   Pulmonary/Chest: Effort normal and breath sounds normal. No respiratory  distress.  Abdominal: Soft. Bowel sounds are normal. She exhibits no distension.  Musculoskeletal:  Left BKA  Neurological:  Patient is lethargic but arousable. Noted decreased sustained attention. She mostly spoke jargon but could provide some yes and no with inconsistency. She was also inconsistent to follow simple commands. Moved all 4's. Sensed pain in all 4's. Could answer simple questions.   Skin: Skin is warm and dry.  Psychiatric:  Lethargic, decreased attention    Results for orders placed during the hospital encounter of 01/04/13 (from the past 24 hour(s))  PROTIME-INR     Status: None   Collection Time    01/05/13  9:53 AM      Result Value Range   Prothrombin Time 14.4  11.6 - 15.2 seconds   INR 1.14  0.00 - 1.49   Ct Head Wo Contrast  01/06/2013   *RADIOLOGY REPORT*  Clinical Data: Evaluate subdural hematoma.  CT HEAD WITHOUT CONTRAST  Technique:  Contiguous axial images were obtained from the base of the skull through the vertex without contrast.  Comparison: Head CT from yesterday.  Findings: No acute osseous or soft tissue abnormality in the interim.  Bilateral acute cerebral convexity subdural hematomas:  The left hematoma is more extensive, with small components present along the falx, convexity at the vertex, left tentorium, and left posterior parietal and occipital  regions.  The thickness component is in the occipital region, deforming the underlying brain, measuring 15 mm in maximal thickness.  Just anteriorly, there is a more extensive portion which measures 11 mm in thickness.  On the right, the density is not as high.  A globular component in the right temporal parietal region continues to measures 1 centimeter. Slight rightward shift is stable from prior, 3 mm in maximal.  No evidence of acute ischemia or hydrocephalus.  IMPRESSION: Unchanged size and extent of bilateral cerebral convexity subdural hematomas, left larger than right. Unchanged mild rightward midline shift.    Original Report Authenticated By: Tiburcio Pea   Ct Head Wo Contrast  01/05/2013   *RADIOLOGY REPORT*  Clinical Data: Altered mental status  CT HEAD WITHOUT CONTRAST  Technique:  Contiguous axial images were obtained from the base of the skull through the vertex without contrast.  Comparison: None.  Findings: Positive for acute left subdural hemorrhage.  The hemorrhage is most prominent in the left parieto-occipital region. Subdural hemorrhage measures up to 13 mm in thickness posteriorly. Acute subdural blood also layers over the tentorium especially on the left. Suspect a small amount of subdural hemorrhage along the falx cerebral posteriorly (images 25 and 26).  There is a smaller volume of subdural hemorrhage on the right, focally at the level of the posterior right frontal region on image number 26 (measures up to 5 mm in thickness), and in the right parietal region focally on images 18-21, where it measures up to 11 mm in thickness.  The ventricles are normal in size.  Negative for midline shift.  No evidence of acute cortically based infarction.  No skull fracture is identified.  Small amount of fluid in the left mastoid air cells.  The visualized paranasal sinuses are clear. Soft tissues of the orbits are symmetric.  No focal scalp abnormality is seen.  IMPRESSION: Acute bilateral subdural hematomas, right greater than left. Critical Value/emergent results were called by telephone at the time of interpretation on 01/05/2013 at 1:13 am to [facility], [place], who verbally acknowledged these results.   Original Report Authenticated By: Britta Mccreedy, M.D.    Assessment/Plan: Diagnosis: bilateral SDH's (after recent/remote fall?). Hx of left bka 1. Does the need for close, 24 hr/day medical supervision in concert with the patient's rehab needs make it unreasonable for this patient to be served in a less intensive setting? Yes 2. Co-Morbidities requiring supervision/potential complications: anxiety,  hx of PE 3. Due to bladder management, bowel management, safety, skin/wound care, disease management, medication administration, pain management and patient education, does the patient require 24 hr/day rehab nursing? Yes 4. Does the patient require coordinated care of a physician, rehab nurse, PT (1-2 hrs/day, 5 days/week), OT (1-2 hrs/day, 5 days/week) and SLP (1-2 hrs/day, 5 days/week) to address physical and functional deficits in the context of the above medical diagnosis(es)? Yes Addressing deficits in the following areas: balance, endurance, locomotion, strength, transferring, bowel/bladder control, bathing, dressing, feeding, grooming, toileting and psychosocial support 5. Can the patient actively participate in an intensive therapy program of at least 3 hrs of therapy per day at least 5 days per week? Yes 6. The potential for patient to make measurable gains while on inpatient rehab is excellent 7. Anticipated functional outcomes upon discharge from inpatient rehab are supervision to min assist with PT, supervision to min assist with OT, mod I to supervision with SLP. 8. Estimated rehab length of stay to reach the above functional goals is: 2-3 weeks 9.  Does the patient have adequate social supports to accommodate these discharge functional goals? Yes and Potentially 10. Anticipated D/C setting: Home 11. Anticipated post D/C treatments: HH therapy 12. Overall Rehab/Functional Prognosis: excellent  RECOMMENDATIONS: This patient's condition is appropriate for continued rehabilitative care in the following setting: CIR Patient has agreed to participate in recommended program. Potentially Note that insurance prior authorization may be required for reimbursement for recommended care.  Comment: Rehab RN to follow up. Will need some supervision at the time of dc.   Ranelle Oyster, MD, [place] [facility]     01/06/2013

## 2013-01-06 NOTE — Progress Notes (Signed)
Speech Language Pathology Dysphagia Treatment Patient Details Name: Frances Finley MRN: [id number] DOB: 1956/02/13 Today's Date: 01/06/2013 Time: 0900-0910 SLP Time Calculation (min): 10 min  Assessment / Plan / Recommendation Clinical Impression  Pt refused solids but was observed with thin liquids and pills, given by RN. No evidence of aspiration. Able to masticate soldis when attending and willing per observation yesterday. Will f/u beginning of next week to determine if diet remains appropriate. Likely to tolerate regular texture when attention to environment improved.     Diet Recommendation  Continue with Current Diet: Dysphagia 3 (mechanical soft);Thin liquid    SLP Plan Continue with current plan of care   Pertinent Vitals/Pain NA   Swallowing Goals  SLP Swallowing Goals Patient will utilize recommended strategies during swallow to increase swallowing safety with: Minimal assistance Swallow Study Goal #2 - Progress: Progressing toward goal  General Temperature Spikes Noted: No Respiratory Status: Room air Behavior/Cognition: Confused;Requires cueing Oral Cavity - Dentition: Adequate natural dentition Patient Positioning: Upright in bed  Oral Cavity - Oral Hygiene Does patient have any of the following "at risk" factors?: None of the above   Dysphagia Treatment Treatment focused on: Skilled observation of diet tolerance Patient observed directly with PO's: Yes Type of PO's observed: Thin liquids Feeding: Needs assist Liquids provided via: Straw Type of cueing: Tactile Amount of cueing: Minimal   GO     Frances Finley, Riley Nearing 01/06/2013, 9:46 AM

## 2013-01-06 NOTE — Progress Notes (Signed)
Physical Therapy Treatment Patient Details Name: Frances Finley MRN: [id number] DOB: Aug 29, 1955 Today's Date: 01/06/2013 Time: 7829-5621 PT Time Calculation (min): 38 min  PT Assessment / Plan / Recommendation  History of Present Illness Pt s/p bil SDH.     PT Comments   Pt admitted with bil SDH. Pt currently with functional limitations due to lethargy affecting patient's ability to participate as well as poor postural stability.  Would benefit from Rehab given somewhat slow progress.  Supportive family.  Pt will benefit from skilled PT to increase their independence and safety with mobility to allow discharge to the venue listed below.    Follow Up Recommendations  Supervision/Assistance - 24 hour;CIR                 Equipment Recommendations  Other (comment) (TBA)    Recommendations for Other Services Rehab consult  Frequency Min 3X/week   Progress towards PT Goals Progress towards PT goals: Progressing toward goals  Plan Current plan remains appropriate    Precautions / Restrictions Precautions Precautions: Fall Restrictions Weight Bearing Restrictions: No   Pertinent Vitals/Pain VSS, no pain    Mobility  Bed Mobility Bed Mobility: Sit to Supine Sit to Supine: 4: Min assist Details for Bed Mobility Assistance: Pt insistent on lying back down therefore assisted alot with sit to supine. Transfers Transfers: Sit to Stand;Stand to Sit;Stand Pivot Transfers Sit to Stand: 1: +2 Total assist;With upper extremity assist;With armrests;From chair/3-in-1 Sit to Stand: Patient Percentage: 60% Stand to Sit: 1: +2 Total assist;With upper extremity assist;With armrests;To chair/3-in-1 Stand to Sit: Patient Percentage: 50% Stand Pivot Transfers: 1: +2 Total assist Stand Pivot Transfers: Patient Percentage: 70% Details for Transfer Assistance: Pt needed cues for hand placement and cues to open eyes.  Pt lethargic and could not get her to open eyes until sit to stand faciliatated.   Once pt stood, she opened her eyes and began stepping toward bed holding onto RW.  Pt with poor safety with steps to bed needing assist to move RW. Pt with poor safety awareness and needing assist to guide RW and steps.  Pt impulsive as she was trying to get back to bed.   Ambulation/Gait Ambulation/Gait Assistance: Not tested (comment) Stairs: No Wheelchair Mobility Wheelchair Mobility: No Modified Rankin (Stroke Patients Only) Pre-Morbid Rankin Score: Moderate disability Modified Rankin: Severe disability     PT Goals (current goals can now be found in the care plan section)    Visit Information  Last PT Received On: 01/06/13 Assistance Needed: +2 History of Present Illness: Pt s/p bil SDH.      Subjective Data  Subjective: "I am going to lie back down."   Cognition  Cognition Arousal/Alertness: Lethargic Behavior During Therapy: Flat affect Overall Cognitive Status: Impaired/Different from baseline Area of Impairment: Orientation;Attention;Following commands;Safety/judgement;Awareness;Problem solving;Memory Orientation Level: Disoriented to;Place;Time;Situation Current Attention Level: Focused Following Commands: Follows one step commands inconsistently Safety/Judgement: Decreased awareness of safety;Decreased awareness of deficits Awareness: Intellectual Problem Solving: Slow processing;Decreased initiation;Difficulty sequencing;Requires verbal cues;Requires tactile cues    Balance  Static Sitting Balance Static Sitting - Balance Support: Bilateral upper extremity supported;Feet supported Static Sitting - Level of Assistance: 4: Min assist Static Sitting - Comment/# of Minutes: 2  End of Session PT - End of Session Equipment Utilized During Treatment: Gait belt Activity Tolerance: Patient limited by fatigue;Patient limited by lethargy Patient left: in bed;with call bell/phone within reach;with family/visitor present Nurse Communication: Mobility status        INGOLD,Keilani Terrance 01/06/2013, 3:46 PM Jacora Hopkins  [place],PT Acute Rehabilitation [phone] (pager)

## 2013-01-06 NOTE — Progress Notes (Signed)
CRITICAL VALUE ALERT  Critical value received:  K+ 2.7  Date of notification:  01/06/13  Time of notification:  0800  Critical value read back:yes  Nurse who received alert:  Marlana Latus, RN  MD notified (1st page):  Dr. Yetta Barre  Time of first page:  0815  MD notified (2nd page):   Time of second page:  Responding MD:  Dr. Yetta Barre  Time MD responded:  [phone]

## 2013-01-07 LAB — BASIC METABOLIC PANEL
BUN: 8 mg/dL (ref 6–23)
BUN: 9 mg/dL (ref 6–23)
Calcium: 9.4 mg/dL (ref 8.4–10.5)
Creatinine, Ser: 0.57 mg/dL (ref 0.50–1.10)
GFR calc Af Amer: >90 mL/min (ref >90)
GFR calc non Af Amer: >90 mL/min (ref >90)
GFR calc non Af Amer: >90 mL/min (ref >90)
Glucose, Bld: 99 mg/dL (ref 70–99)
Sodium: 133 mEq/L — ABNORMAL LOW (ref 135–145)

## 2013-01-07 MED ORDER — POTASSIUM CHLORIDE CRYS ER 20 MEQ PO TBCR
40.0000 meq | EXTENDED_RELEASE_TABLET | Freq: Four times a day (QID) | ORAL | Status: AC
  Administered 2013-01-07 – 2013-01-08 (×6): 40 meq via ORAL
  Filled 2013-01-07 (×6): qty 2

## 2013-01-07 NOTE — ED Provider Notes (Signed)
Medical screening examination/treatment/procedure(s) were conducted as a shared visit with non-physician practitioner(s) and myself.  I personally evaluated the patient during the encounter  57 yo female with AMS for a few days, getting worse.  Arousable on exam, but confused, disoriented, and unable to answer directed questions.  No distress, respirations even and unlabored.  CT head showed subdural hematomas.  NSU consulted and admitted.  Clinical Impression: 1. Subdural hemorrhage       Candyce Churn, MD 01/07/13 1214

## 2013-01-07 NOTE — Progress Notes (Signed)
CRITICAL VALUE ALERT  Critical value received:  Potassium value of  2.7  Date of notification:  01/07/2014  Time of notification:  0528  Critical value read back: Yes  Nurse who received alert:  Lily Peer   MD notified (1st page):  Dr. Tressie Stalker  Time of first page:  0532  Time MD responded:  0534  Orders received review orders and [facility]

## 2013-01-07 NOTE — ED Provider Notes (Signed)
Medical screening examination/treatment/procedure(s) were conducted as a shared visit with non-physician practitioner(s) and myself.  I personally evaluated the patient during the encounter.   Please see my separate note.     Candyce Churn, MD 01/07/13 [phone]

## 2013-01-07 NOTE — Progress Notes (Signed)
Patient ID: Frances Finley, female   DOB: April 27, 1956, 57 y.o.   MRN: [id number] Subjective:  The patient is mildly somnolent but easily arousable. She is a bit confused and dysphasic.  Objective: Vital signs in last 24 hours: Temp:  [97.3 F (36.3 C)-98.9 F (37.2 C)] 97.8 F (36.6 C) (09/06 0800) Pulse Rate:  [61-87] 87 (09/06 0600) Resp:  [16-32] 16 (09/06 0600) BP: (118-155)/(57-104) 137/84 mmHg (09/06 0600) SpO2:  [94 %-99 %] 97 % (09/06 0600) Weight:  [96.4 kg (212 lb 8.4 oz)] 96.4 kg (212 lb 8.4 oz) (09/06 0400)  Intake/Output from previous day: 09/05 0701 - 09/06 0700 In: 1780 [P.O.:270; I.V.:1405; IV Piggyback:105] Out: 4100 [Urine:4100] Intake/Output this shift:    Physical exam Glasgow Coma Scale 14, E. 4 M 6V4, she is mildly dysphasic. She is moving all 4 extremities.  Lab Results:  Recent Labs  01/04/13 2125  WBC 12.2*  HGB 17.0*  HCT 46.8*  PLT 378   BMET  Recent Labs  01/06/13 0750 01/07/13 0355  NA 134* 133*  K 2.7* 2.7*  CL 96 90*  CO2 24 26  GLUCOSE 98 99  BUN 10 8  CREATININE 0.44* 0.53  CALCIUM 8.7 9.4    Studies/Results: Ct Head Wo Contrast  01/06/2013   *RADIOLOGY REPORT*  Clinical Data: Evaluate subdural hematoma.  CT HEAD WITHOUT CONTRAST  Technique:  Contiguous axial images were obtained from the base of the skull through the vertex without contrast.  Comparison: Head CT from yesterday.  Findings: No acute osseous or soft tissue abnormality in the interim.  Bilateral acute cerebral convexity subdural hematomas:  The left hematoma is more extensive, with small components present along the falx, convexity at the vertex, left tentorium, and left posterior parietal and occipital regions.  The thickness component is in the occipital region, deforming the underlying brain, measuring 15 mm in maximal thickness.  Just anteriorly, there is a more extensive portion which measures 11 mm in thickness.  On the right, the density is not as high.  A  globular component in the right temporal parietal region continues to measures 1 centimeter. Slight rightward shift is stable from prior, 3 mm in maximal.  No evidence of acute ischemia or hydrocephalus.  IMPRESSION: Unchanged size and extent of bilateral cerebral convexity subdural hematomas, left larger than right. Unchanged mild rightward midline shift.   Original Report Authenticated By: Tiburcio Pea    Assessment/Plan: Subdural hematomas: The patient is neurologically stable. She will likely go to rehabilitation next week. We will continue to observe her in the ICU.  LOS: 3 days     Lesieli Bresee D 01/07/2013, 8:52 AM

## 2013-01-08 MED ORDER — LEVETIRACETAM 500 MG PO TABS
500.0000 mg | ORAL_TABLET | Freq: Two times a day (BID) | ORAL | Status: DC
Start: 2013-01-08 — End: 2013-01-10
  Administered 2013-01-08 – 2013-01-10 (×4): 500 mg via ORAL
  Filled 2013-01-08 (×5): qty 1

## 2013-01-08 MED ORDER — PANTOPRAZOLE SODIUM 40 MG PO TBEC
40.0000 mg | DELAYED_RELEASE_TABLET | Freq: Every day | ORAL | Status: DC
  Administered 2013-01-08 – 2013-01-09 (×2): 40 mg via ORAL
  Filled 2013-01-08 (×2): qty 1

## 2013-01-08 NOTE — Progress Notes (Signed)
Patient ID: Frances Finley, female   DOB: [birth year], 56 y.o.   MRN: [id number] Subjective:  The patient is alert and pleasant. She looks better than yesterday.  Objective: Vital signs in last 24 hours: Temp:  [97.6 F (36.4 C)-98.7 F (37.1 C)] 98.4 F (36.9 C) (09/07 0441) Pulse Rate:  [71-106] 71 (09/07 0700) Resp:  [14-29] 20 (09/07 0700) BP: (113-164)/(60-89) 158/64 mmHg (09/07 0700) SpO2:  [95 %-100 %] 99 % (09/07 0700)  Intake/Output from previous day: 09/06 0701 - 09/07 0700 In: 2080 [P.O.:250; I.V.:1725; IV Piggyback:105] Out: 1680 [Urine:1680] Intake/Output this shift:    Physical exam the patient is alert and oriented x2. She is moving all 4 extremities. Her dysphagia has improved since yesterday.  Lab Results: No results found for this basename: WBC, HGB, HCT, PLT,  in the last 72 hours BMET  Recent Labs  01/07/13 0355 01/07/13 1459  NA 133* 132*  K 2.7* 3.6  CL 90* 91*  CO2 26 24  GLUCOSE 99 97  BUN 8 9  CREATININE 0.53 0.57  CALCIUM 9.4 9.6    Studies/Results: No results found.  Assessment/Plan: Subdural hematoma: the patient has improved clinically. We will plan to repeat her CAT scan tomorrow. We will continue to observe her in the ICU.  LOS: 4 days     Gwynn Chalker D 01/08/2013, 10:57 AM

## 2013-01-09 ENCOUNTER — Inpatient Hospital Stay (HOSPITAL_COMMUNITY): Payer: Medicare Other

## 2013-01-09 ENCOUNTER — Encounter (HOSPITAL_COMMUNITY): Payer: Self-pay | Admitting: Radiology

## 2013-01-09 NOTE — Progress Notes (Signed)
Physical Therapy Treatment Patient Details Name: Frances Finley MRN: [id number] DOB: 07-26-1955 Today's Date: 01/09/2013 Time: 4098-1191 PT Time Calculation (min): 27 min  PT Assessment / Plan / Recommendation  History of Present Illness Pt s/p bil SDH.     PT Comments   Pt admitted with bil SDH. Pt currently with functional limitations due to continued cognitive deficits and weakness from immobility.  Pt progressing and much more participatory today.   Pt will benefit from skilled PT to increase their independence and safety with mobility to allow discharge to the venue listed below.   Follow Up Recommendations  Supervision/Assistance - 24 hour;CIR     Does the patient have the potential to tolerate intense rehabilitation   yes          Equipment Recommendations  Other (comment) (TBA)    Recommendations for Other Services Rehab consult  Frequency Min 3X/week   Progress towards PT Goals Progress towards PT goals: Progressing toward goals  Plan Current plan remains appropriate    Precautions / Restrictions Precautions Precautions: Fall Restrictions Weight Bearing Restrictions: No   Pertinent Vitals/Pain VSS, no pain    Mobility  Bed Mobility Bed Mobility: Rolling Left;Left Sidelying to Sit;Sitting - Scoot to [place] of Bed Rolling Right: Not tested (comment) Rolling Left: 4: Min assist Right Sidelying to Sit: Not tested (comment) Left Sidelying to Sit: 4: Min assist Sitting - Scoot to Edge of Bed: 4: Min guard Details for Bed Mobility Assistance: Pt required min assist with bed mobility to initiate movement and once initiated, pt would complete movement.  Pt did take incr time to complete all tasks including supine to sit.   Transfers Transfers: Sit to Stand;Stand to Sit Sit to Stand: 1: +2 Total assist;From elevated surface;With upper extremity assist;From bed Sit to Stand: Patient Percentage: 60% Stand to Sit: 1: +2 Total assist;With upper extremity assist;With  armrests;To chair/3-in-1 Stand to Sit: Patient Percentage: 60% Stand Pivot Transfers: Not tested (comment) Details for Transfer Assistance: Pt asked by PT to place her prosthesis on.  Pt would not take the silicone sleeve therefore, PT had to take pt's hands and give it to pt.  With incr time and cues, pt took the sleeve and put it on.  Then had to do the same thing with the silicone cover and sock.  Had to place the prosthetic leg for pt as she was not able to remember what to do next.  Pt did begin to scoot out and try to get the prosthetic leg clicked into place once walker placed in front of pt and she processed she needed to get up.  Took incr time  and tactile and verbal cues to place hands in appropriate position to stand.  With continued cues, pt stood with incr cues to lean forward and incr time to reach hands to RW.  Needed steadying assist initially to get balance once in standing.      Ambulation/Gait Ambulation/Gait Assistance: 1: +2 Total assist Ambulation/Gait: Patient Percentage: 70% Ambulation Distance (Feet): 56 Feet Assistive device: Rolling walker Ambulation/Gait Assistance Details: Pt ambulated with RW with cues for sequencing steps and RW.  Pt with wide BOS and needed assist to steer RW as she was unsteady overall.  Needed constant cues to keep walking or pt would stop.  Pt needs constant cues to complete all tasks including each component of walking.   Gait Pattern: Step-through pattern;Decreased stride length;Decreased step length - left;Wide base of support;Trunk flexed;Ataxic Gait velocity: decreased Stairs: No Wheelchair  Mobility Wheelchair Mobility: No Modified Rankin (Stroke Patients Only) Pre-Morbid Rankin Score: Moderate disability Modified Rankin: Severe disability     PT Goals (current goals can now be found in the care plan section)    Visit Information  Last PT Received On: 01/09/13 Assistance Needed: +2 PT/OT Co-Evaluation/Treatment: Yes History of  Present Illness: Pt s/p bil SDH.      Subjective Data  Subjective: "I have never done this before, "pt stated when asked to place her prosthesis.   Cognition  Cognition Arousal/Alertness: Awake/alert Behavior During Therapy: Flat affect Overall Cognitive Status: Impaired/Different from baseline Area of Impairment: Orientation;Attention;Following commands;Safety/judgement;Awareness;Problem solving;Memory Orientation Level: Disoriented to;Place;Time;Situation Current Attention Level: Focused Following Commands: Follows one step commands inconsistently;Follows one step commands with increased time Safety/Judgement: Decreased awareness of safety;Decreased awareness of deficits Awareness: Intellectual Problem Solving: Slow processing;Decreased initiation;Difficulty sequencing;Requires verbal cues;Requires tactile cues General Comments: Pt needed tactile and verbal cues for initiation of all movements.  Receptive aphasia as well as probable expressive aphasia.  Poor overall cognition.      Balance  Static Sitting Balance Static Sitting - Balance Support: Bilateral upper extremity supported;Feet supported Static Sitting - Level of Assistance: 5: Stand by assistance Static Sitting - Comment/# of Minutes: Sat 10 min EOB and donned prosthesis at EOB.   Static Standing Balance Static Standing - Balance Support: Bilateral upper extremity supported;During functional activity Static Standing - Level of Assistance: 4: Min assist Static Standing - Comment/# of Minutes: Stood with RW 5 min with steadying assist needed secondary to postural instability.    End of Session PT - End of Session Equipment Utilized During Treatment: Gait belt Activity Tolerance: Patient limited by fatigue;Patient limited by lethargy Patient left: in chair;with family/visitor present;with call bell/phone within reach Nurse Communication: Mobility status        INGOLD,Nikoloz Huy 01/09/2013, 11:54 AM  [facility] [phone] (pager)

## 2013-01-09 NOTE — Progress Notes (Signed)
Speech Language Pathology Treatment Patient Details Name: Frances Finley MRN: [id number] DOB: [birth year] Today's Date: 01/09/2013 Time: 3244-0102 SLP Time Calculation (min): 27 min  Assessment / Plan / Recommendation Clinical Impression  Pt seen during am meal, no evidence of dysphagia or aspiration observed though contextual and tactile cues needed to aid pt in initiation of feeding. Pt reluctant to eat though readily accepts liquids. Cues most successful when food, functional object placed in left hand with gentile tactile cue to initiate appropriate action. Ideational apraxia observed with toothbrush, hairbursh, napkin (pt eating). Verbal cues unsuccessful with pt, no appropriate resonse to verbal cues though pt did have a few appropriate automatic verbal responses related to context. Pt also responded to pragmatic cues (smiling laughing). Severe receptive aphasia persists. No f/u needed for swallowing.     SLP Plan  Continue with current plan of care    Pertinent Vitals/Pain NA  SLP Goals  SLP Goals Potential to Achieve Goals: Good Progress/Goals/Alternative treatment plan discussed with pt/caregiver and they: Agree SLP Goal #1: Pt will sustain attention to basic functional task for 30 seconds with max contextual and tactile cues.  SLP Goal #1 - Progress: Progressing toward goal SLP Goal #2: Pt will verbalize during melodic intonation task or song with max prosodic cues.  SLP Goal #2 - Progress: Progressing toward goal SLP Goal #3: Pt will maintain eye contact with speaker for 15 seconds to attempt communication task with max assist.  SLP Goal #3 - Progress: Progressing toward goal  General Temperature Spikes Noted: No Respiratory Status: Room air Behavior/Cognition: Confused;Requires cueing Oral Cavity - Dentition: Adequate natural dentition Patient Positioning: Upright in bed  Oral Cavity - Oral Hygiene Does patient have any of the following "at risk" factors?: None of the  above   Treatment Treatment focused on: Cognition;Aphasia   GO    Harlon Ditty, MA CCC-SLP [phone]  Claudine Mouton 01/09/2013, 10:07 AM

## 2013-01-09 NOTE — H&P (Signed)
Physical Medicine and Rehabilitation Admission H&P    Chief Complaint  Patient presents with  . Altered Mental Status  : HPI: Frances Finley is a 56 y.o. 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 Admitted 01/05/2013 with progressive change in mental status denies any recent trauma. Cranial CT scan showed acute bilateral subdural hematomas, right greater than left. Followup neurosurgery Dr. David Jones. INR on admission of 2.15 and was reversed. Advise conservative care of subdural hematoma with serial followup cranial CT scans that remained stable. Followup CT of the brain 01/09/2013 with no new hemorrhage and no change in the approximately 3 mm midline shift from left-to-right . Keppra was added for seizure prophylaxis. Physical occupational therapy evaluations completed an ongoing. Recommendations have been made for physical medicine rehabilitation consult to consider inpatient rehabilitation services. Patient was felt to be a good candidate for inpatient rehabilitation services and was admitted for comprehensive rehabilitation program today.   Review of Systems  Unable to perform ROS: language, staff/family report that she fatigues easily. Has been sleeping well.    Past Medical History  Diagnosis Date  . GERD (gastroesophageal reflux disease)   . DVT (deep venous thrombosis)   . PE (pulmonary embolism)   . SLE inhibitor syndrome   . Aortic thromboembolism   . Hypothyroidism   . Hypertension   . Iron deficiency anemia    Past Surgical History  Procedure Laterality Date  . Below knee leg amputation  2003  . Cholecystectomy    . Abdominal hysterectomy     No family history on file. Social History:  reports that she has quit smoking. She does not have any smokeless tobacco history on file. She reports that  drinks alcohol. She reports that she does not use illicit drugs. Allergies: No Known  Allergies Medications Prior to Admission  Medication Sig Dispense Refill  . albuterol (PROVENTIL HFA;VENTOLIN HFA) 108 (90 BASE) MCG/ACT inhaler Inhale 2 puffs into the lungs every 6 (six) hours as needed for wheezing.      . cyclobenzaprine (FLEXERIL) 10 MG tablet Take 10 mg by mouth 3 (three) times daily as needed for muscle spasms.      . diazepam (VALIUM) 5 MG tablet Take 5 mg by mouth every 6 (six) hours as needed for anxiety.      . esomeprazole (NEXIUM) 40 MG capsule Take 40 mg by mouth 2 (two) times daily.       . gabapentin (NEURONTIN) 600 MG tablet Take 600 mg by mouth 2 (two) times daily.      . HYDROCHLOROTHIAZIDE PO Take 25 mg by mouth daily.      . levothyroxine (SYNTHROID, LEVOTHROID) 100 MCG tablet Take 100 mcg by mouth daily before breakfast.      . oxyCODONE (OXYCONTIN) 10 MG 12 hr tablet Take 20 mg by mouth 3 (three) times daily.      . potassium chloride (K-DUR,KLOR-CON) 10 MEQ tablet Take 10 mEq by mouth daily.      . senna (SENOKOT) 8.6 MG tablet Take 1 tablet by mouth daily.      . sennosides-docusate sodium (SENOKOT-S) 8.6-50 MG tablet Take 1 tablet by mouth 2 (two) times daily.      . simvastatin (ZOCOR) 20 MG tablet Take 20 mg by mouth every evening.      . triamcinolone cream (KENALOG) 0.1 % Apply 1 application topically 2 (two) times daily.      . Vitamin D, Ergocalciferol, (  DRISDOL) 50000 UNITS CAPS capsule Take 50,000 Units by mouth every 7 (seven) days. Family not sure of what day she takes it      . warfarin (COUMADIN) 5 MG tablet Take 5 mg by mouth daily.      . azithromycin (ZITHROMAX) 250 MG tablet Take 250 mg by mouth daily. Two tablets on day one and 1 tablet for the next 4 days.      . furosemide (LASIX) 40 MG tablet Take 40 mg by mouth 2 (two) times daily. For 6 days.        Home: Home Living Family/patient expects to be discharged to:: Private residence Living Arrangements: Spouse/significant other Available Help at Discharge: Family;Available 24  hours/day Type of Home: House Home Access: Stairs to enter Entrance Stairs-Number of Steps: 3 Entrance Stairs-Rails: Can reach both Home Layout: One level Home Equipment: Walker - 2 wheels;Tub bench;Bedside commode;Cane - single point  Lives With: Spouse   Functional History:    Functional Status:  Mobility: Bed Mobility Bed Mobility: Sit to Supine Rolling Right: 2: Max assist Rolling Left: 2: Max assist Right Sidelying to Sit: 1: +2 Total assist;HOB flat Right Sidelying to Sit: Patient Percentage: 60% Sitting - Scoot to Edge of Bed: 3: Mod assist Sit to Supine: 4: Min assist Transfers Transfers: Sit to Stand;Stand to Sit;Stand Pivot Transfers Sit to Stand: 1: +2 Total assist;With upper extremity assist;With armrests;From chair/3-in-1 Sit to Stand: Patient Percentage: 60% Stand to Sit: 1: +2 Total assist;With upper extremity assist;With armrests;To chair/3-in-1 Stand to Sit: Patient Percentage: 50% Stand Pivot Transfers: 1: +2 Total assist Stand Pivot Transfers: Patient Percentage: 70% Ambulation/Gait Ambulation/Gait Assistance: Not tested (comment) Stairs: No Wheelchair Mobility Wheelchair Mobility: No  ADL: ADL Eating/Feeding: Other (comment) (able to hold cup and bring to mouth) Grooming: Maximal assistance Where Assessed - Grooming: Supported sitting Upper Body Bathing: Maximal assistance Where Assessed - Upper Body Bathing: Supported sitting Transfers/Ambulation Related to ADLs: not assessed at this time. Will need +2 ADL Comments: currently total A for ADL due to lethargy  Cognition: Cognition Overall Cognitive Status: Impaired/Different from baseline Arousal/Alertness: Awake/alert Orientation Level: Disoriented to place;Disoriented to situation;Disoriented to time;Disoriented to person Attention: Focused Focused Attention: Impaired Focused Attention Impairment: Verbal basic;Functional basic (all higher level function impaired by attention  deficits) Behaviors: Restless Cognition Arousal/Alertness: Lethargic Behavior During Therapy: Flat affect Overall Cognitive Status: Impaired/Different from baseline Area of Impairment: Orientation;Attention;Following commands;Safety/judgement;Awareness;Problem solving;Memory Orientation Level: Disoriented to;Place;Time;Situation Current Attention Level: Focused Following Commands: Follows one step commands inconsistently Safety/Judgement: Decreased awareness of safety;Decreased awareness of deficits Awareness: Intellectual Problem Solving: Slow processing;Decreased initiation;Difficulty sequencing;Requires verbal cues;Requires tactile cues  Physical Exam: Blood pressure 146/59, pulse 83, temperature 97.5 F (36.4 C), temperature source Oral, resp. rate 23, height 5' 9" (1.753 m), weight 95 kg (209 lb 7 oz), SpO2 99.00%.    Gen: pt is difficult to arouse HENT:  Head: Normocephalic.  Eyes: EOM are normal.  No nystagmus  Neck: Normal range of motion. Neck supple. No thyromegaly present.  Cardiovascular: Normal rate and regular rhythm. No murmurs Pulmonary/Chest: Effort normal and breath sounds normal. No respiratory distress. No wheezes Abdominal: Soft. Bowel sounds are normal. She exhibits no distension.  Musculoskeletal:  Left BKA. Limb well-formed, non-tender Neurological:  Patient is lethargic, difficult to arouse.  She was also inconsistent to follow simple commands. Moved all 4's fairly spontaneously to pain sense.. Sensed pain in all 4's. Could answer simple questions.  Skin: Skin is warm and dry.  Psychiatric:  Lethargic.       Results for orders placed during the hospital encounter of 01/04/13 (from the past 48 hour(s))  BASIC METABOLIC PANEL     Status: Abnormal   Collection Time    01/07/13  2:59 PM      Result Value Range   Sodium 132 (*) 135 - 145 mEq/L   Potassium 3.6  3.5 - 5.1 mEq/L   Comment: DELTA CHECK NOTED   Chloride 91 (*) 96 - 112 mEq/L   CO2 24  19 - 32  mEq/L   Glucose, Bld 97  70 - 99 mg/dL   BUN 9  6 - 23 mg/dL   Creatinine, Ser 0.57  0.50 - 1.10 mg/dL   Calcium 9.6  8.4 - 10.5 mg/dL   GFR calc non Af Amer >90  >90 mL/min   GFR calc Af Amer >90  >90 mL/min   Comment: (NOTE)     The eGFR has been calculated using the CKD EPI equation.     This calculation has not been validated in all clinical situations.     eGFR's persistently <90 mL/min signify possible Chronic Kidney     Disease.   No results found.  Post Admission Physician Evaluation: 1. Functional deficits secondary  to bilateral SDH's likely due to fall. 2. Patient is admitted to receive collaborative, interdisciplinary care between the physiatrist, rehab nursing staff, and therapy team. 3. [name] level of medical complexity and substantial therapy needs in context of that medical necessity cannot be provided at a lesser intensity of care such as a SNF. 4. Patient has experienced substantial functional loss from his/her baseline which was documented above under the "Functional History" and "Functional Status" headings.  Judging by the patient's diagnosis, physical exam, and functional history, the patient has potential for functional progress which will result in measurable gains while on inpatient rehab.  These gains will be of substantial and practical use upon discharge  in facilitating mobility and self-care at the household level. 5. Physiatrist will provide 24 hour management of medical needs as well as oversight of the therapy plan/treatment and provide guidance as appropriate regarding the interaction of the two. 6. 24 hour rehab nursing will assist with bladder management, bowel management, safety, skin/wound care, disease management, medication administration, pain management and patient education  and help integrate therapy concepts, techniques,education, etc. 7. PT will assess and treat for/with: Lower extremity strength, range of motion, stamina, balance, functional  mobility, safety, adaptive techniques and equipment, NMR, education.   Goals are: supervision to minimal assist. 8. OT will assess and treat for/with: ADL's, functional mobility, safety, upper extremity strength, adaptive techniques and equipment, NMR, education.   Goals are: supervision to minimal assist. 9. SLP will assess and treat for/with: cognition, communication.  Goals are: supervision. 10. Case Management and Social Worker will assess and treat for psychological issues and discharge planning. 11. Team conference will be held weekly to assess progress toward goals and to determine barriers to discharge. 12. Patient will receive at least 3 hours of therapy per day at least 5 days per week. 13. ELOS: 2-3 weeks       14. Prognosis:  excellent   Medical Problem List and Plan: 1. Bilateral subdural hematomas after recent/remote fall 2. DVT Prophylaxis/Anticoagulation: SCDs. Monitor for signs of DVT. History of DVT and pulmonary emboli. Coumadin discontinued secondary to subdural hematoma 3. Pain Management: Neurontin 600 mg twice a day, Tylenol as needed. Monitor with increased mobility. Patient was taking OxyContin sustained-release 10 mg 3 times a day prior   to admission. Plan to resume as/when tolerated and needed. 4. Mood/anxiety. Valium 5 mg every 6 hours as needed--limit as possible given neuro-cognitive deficits 5. Neuropsych: This patient is not capable of making decisions on her own behalf. 6. Seizure prophylaxis. Keppra 500 mg twice a day. Monitor for any signs of seizure 7. History of left BKA. Patient does have a prosthesis 8. Hypothyroidism. Synthroid 9. Hypertension. Lasix 40 mg twice a day, hydrochlorothiazide 25 mg daily. Monitor with increased activity 10. Hypokalemia. Continue supplement and followup labs  Ayva Veilleux T. Ivis Henneman, MD, FAAPMR [facility]  01/09/2013 

## 2013-01-09 NOTE — Progress Notes (Signed)
Occupational Therapy Treatment Patient Details Name: Frances Finley MRN: [id number] DOB: 1956/04/30 Today's Date: 01/09/2013 Time: 4098-1191 OT Time Calculation (min): 29 min  OT Assessment / Plan / Recommendation  History of present illness Pt s/p bil SDH.     OT comments  Pt will continue to benefit from acute OT with follow up on CIR to get to a S level.  Follow Up Recommendations  CIR       Equipment Recommendations  3 in 1 bedside comode    Recommendations for Other Services Rehab consult  Frequency Min 2X/week      Plan Discharge plan remains appropriate    Precautions / Restrictions Precautions Precautions: Fall Restrictions Weight Bearing Restrictions: No       ADL  Lower Body Dressing: Maximal assistance (total A don right sock shoe, mod A to don RLE prothestic) Where Assessed - Lower Body Dressing: Supported sit to Pharmacist, hospital: +2 Total assistance Toilet Transfer: Patient Percentage: 60% Toilet Transfer Method: Sit to [place]:  (Bed> out into hallway>recliner behind her) Transfers/Ambulation Related to ADLs: total A +2 (pt=60%) for sit<>stand and to ambulate with RW        OT Goals(current goals can now be found in the care plan section)    Visit Information  Last OT Received On: 01/09/13 Assistance Needed: +2 PT/OT Co-Evaluation/Treatment: Yes History of Present Illness: Pt s/p bil SDH.      Subjective Data      Prior Functioning       Cognition  Cognition Arousal/Alertness: Awake/alert Behavior During Therapy: Flat affect Overall Cognitive Status: Impaired/Different from baseline Area of Impairment: Orientation;Attention;Following commands;Safety/judgement;Awareness;Problem solving;Memory Orientation Level: Disoriented to;Place;Time;Situation Current Attention Level: Focused Following Commands: Follows one step commands inconsistently;Follows one step commands with increased time Safety/Judgement:  Decreased awareness of safety;Decreased awareness of deficits Awareness: Intellectual Problem Solving: Slow processing;Decreased initiation;Difficulty sequencing;Requires verbal cues;Requires tactile cues    Mobility  Bed Mobility Bed Mobility: Rolling Left;Left Sidelying to Sit;Sitting - Scoot to [place] of Bed Rolling Right: Not tested (comment) Rolling Left: 4: Min assist Right Sidelying to Sit: Not tested (comment) Right Sidelying to Sit: Patient Percentage: 60% Left Sidelying to Sit: 4: Min assist Sitting - Scoot to Edge of Bed: 4: Min guard Sit to Supine: 4: Min assist Details for Bed Mobility Assistance: Pt required min assist with bed mobility to initiate movement and once initiated, pt would complete movement.  Pt did take incr time to complete all tasks including supine to sit.   Transfers Sit to Stand: 1: +2 Total assist;From elevated surface;With upper extremity assist;From bed Sit to Stand: Patient Percentage: 60% Stand to Sit: 1: +2 Total assist;With upper extremity assist;With armrests;To chair/3-in-1 Stand to Sit: Patient Percentage: 60% Details for Transfer Assistance: Pt asked by PT to place her prosthesis on.  Pt would not take the silicone sleeve therefore, PT had to take pt's hands and give it to pt.  With incr time and cues, pt took the sleeve and put it on.  Then had to do the same thing with the silicone cover and sock.  Had to place the prosthetic leg for pt as she was not able to remember what to do next.  Pt did begin to scoot out and try to get the prosthetic leg clicked into place once walker placed in front of pt and she processed she needed to get up.  Took incr time  and tactile and verbal cues to place hands in appropriate position to stand.  With continued cues, pt stood with incr cues to lean forward and incr time to reach hands to RW.  Needed steadying assist initially to get balance once in standing.            Balance Static Sitting Balance Static Sitting -  Balance Support: Bilateral upper extremity supported;Feet supported Static Sitting - Level of Assistance: 5: Stand by assistance Static Sitting - Comment/# of Minutes: sat 10 mins EOB and donned prothesis Static Standing Balance Static Standing - Balance Support: Bilateral upper extremity supported;During functional activity Static Standing - Level of Assistance:  (min guard A) Static Standing - Comment/# of Minutes: Stood with RW 5 min with min guard A needed secondary to postural instability   End of Session OT - End of Session Equipment Utilized During Treatment: Gait belt;Rolling walker Activity Tolerance: Patient tolerated treatment well Patient left: in chair;with call bell/phone within reach;with family/visitor present       Evette Georges [phone] 01/09/2013, 2:06 PM

## 2013-01-09 NOTE — Progress Notes (Signed)
I will begin insurance authorization for admission to inpt rehab when felt medically ready. ? Tuesday 9/9. Please clarify. I met with pt's husband and he is in agreement. [phone]

## 2013-01-09 NOTE — Progress Notes (Signed)
Patient ID: Frances Finley, female   DOB: [birth year], 56 y.o.   MRN: [id number] Subjective: Patient reports she feels better.  Objective: Vital signs in last 24 hours: Temp:  [97.5 F (36.4 C)-99.5 F (37.5 C)] 98.6 F (37 C) (09/08 0818) Pulse Rate:  [70-97] 83 (09/08 0700) Resp:  [17-30] 23 (09/08 0700) BP: (126-163)/(59-118) 146/59 mmHg (09/08 0700) SpO2:  [96 %-100 %] 99 % (09/08 0700) Weight:  [95 kg (209 lb 7 oz)] 95 kg (209 lb 7 oz) (09/08 0500)  Intake/Output from previous day: 09/07 0701 - 09/08 0700 In: 1950 [P.O.:150; I.V.:1800] Out: 2000 [Urine:2000] Intake/Output this shift: Total I/O In: 75 [I.V.:75] Out: 30 [Urine:30]  Neurologic: Grossly normal except for poor naming, fluency better, follows commands, MAEx4  Lab Results: Lab Results  Component Value Date   WBC 12.2* 01/04/2013   HGB 17.0* 01/04/2013   HCT 46.8* 01/04/2013   MCV 88.1 01/04/2013   PLT 378 01/04/2013   Lab Results  Component Value Date   INR 1.14 01/05/2013   PROTIME 15.6* 05/11/2011   BMET Lab Results  Component Value Date   NA 132* 01/07/2013   K 3.6 01/07/2013   CL 91* 01/07/2013   CO2 24 01/07/2013   GLUCOSE 97 01/07/2013   BUN 9 01/07/2013   CREATININE 0.57 01/07/2013   CALCIUM 9.6 01/07/2013    Studies/Results: Ct Head Wo Contrast  01/09/2013   *RADIOLOGY REPORT*  Clinical Data: Bilateral subdural hematomas.  CT HEAD WITHOUT CONTRAST  Technique:  Contiguous axial images were obtained from the base of the skull through the vertex without contrast.  Comparison: 09/05 and 01/05/2013  Findings: There is no new intracranial hemorrhage.  No change in the approximately 3 mm midline shift from left to right.  The subdural hematomas on the right and left are slightly less apparent consistent with aging of the blood.  No osseous abnormality.  IMPRESSION:  1.  Aging of the bilateral subdural hematomas.  No new hemorrhage. 2.  Stable slight midline shift from left to right.   Original Report Authenticated By: Francene Boyers, M.D.    Assessment/Plan: Slow improvement. To floor today. CT stable.   LOS: 5 days    Brighton Pilley S 01/09/2013, 8:30 AM

## 2013-01-09 NOTE — PMR Pre-admission (Signed)
PMR Admission Coordinator Pre-Admission Assessment  Patient: Frances Finley is an 57 y.o., female MRN: [id number] DOB: [birth year] Height: 5\' 9"  (175.3 cm) Weight: 95 kg (209 lb 7 oz)              Insurance Information HMO: yes    PPO:      PCP:      IPA:      80/20:      OTHER: medicare replacement policy PRIMARY: Blue Medicare      Policy#: [id number]      Subscriber: pt CM Name: Mervin Hack     Phone#: [phone]     Fax#: [phone] Pre-Cert#: [id number]      Employer: disabled f/u on 9/16 with Dub Mikes at [phone] Benefits:  Phone #: [phone]     Name: 9/8 Eff. Date: 05/04/12     Deduct: none      Out of Pocket Max: $3400      Life Max: none CIR: $170 per day, days 1 through 7      SNF: no copay days 1 through 10; $50 copay each days 11 through 100 Outpatient: $35 per visit     Co-Pay: no visit limit Home Health: 100%      Co-Pay: no visit limit DME: 80%    Co-Pay: 20% Providers: in network  SECONDARY: none        Medicaid Application Date:       Case Manager:   Emergency Contact Information Contact Information   Name Relation Home Work Mobile   [place] Spouse [phone]      Current Medical History  Patient Admitting Diagnosis: B SDH. Hx of left BKA  History of Present Illness:Frances Finley is a 57 y.o. 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 Admitted 01/05/2013 with progressive change in mental status denies any recent trauma. Cranial CT scan showed acute bilateral subdural hematomas, right greater than left. Followup neurosurgery Dr. Marikay Alar. INR on admission of 2.15 and was reversed. Advise conservative care of subdural hematoma with serial followup cranial CT scans that remained stable. Keppra was added for seizure prophylaxis.   Past Medical History  Past Medical History  Diagnosis Date  . GERD (gastroesophageal reflux disease)   . DVT (deep venous thrombosis)   . PE  (pulmonary embolism)   . SLE inhibitor syndrome   . Aortic thromboembolism   . Hypothyroidism   . Hypertension   . Iron deficiency anemia     Family History  family history is not on file.  Prior Rehab/Hospitalizations: outpatient with prosthesis   Current Medications  Current facility-administered medications:0.9 % NaCl with KCl 20 mEq/ L  infusion, , Intravenous, Continuous, Tia Alert, MD, Last Rate: 75 mL/hr at 01/09/13 1100;  acetaminophen (TYLENOL) suppository 650 mg, 650 mg, Rectal, Q4H PRN, Tia Alert, MD;  acetaminophen (TYLENOL) tablet 650 mg, 650 mg, Oral, Q4H PRN, Tia Alert, MD, 650 mg at 01/09/13 2006 albuterol (PROVENTIL HFA;VENTOLIN HFA) 108 (90 BASE) MCG/ACT inhaler 2 puff, 2 puff, Inhalation, Q6H PRN, Tia Alert, MD;  furosemide (LASIX) tablet 40 mg, 40 mg, Oral, BID, Tia Alert, MD, 40 mg at 01/10/13 1008;  gabapentin (NEURONTIN) tablet 600 mg, 600 mg, Oral, BID, Tia Alert, MD, 600 mg at 01/10/13 1008;  hydrochlorothiazide (HYDRODIURIL) tablet 25 mg, 25 mg, Oral, Daily, Tia Alert, MD, 25 mg at 01/10/13 1007 labetalol (NORMODYNE,TRANDATE) injection 10-40 mg, 10-40 mg, Intravenous, Q10 min PRN, Kermit Balo  Yetta Barre, MD;  levETIRAcetam (KEPPRA) tablet 500 mg, 500 mg, Oral, BID, Tia Alert, MD, 500 mg at 01/10/13 1008;  levothyroxine (SYNTHROID, LEVOTHROID) tablet 100 mcg, 100 mcg, Oral, QAC breakfast, Tia Alert, MD, 100 mcg at 01/10/13 1009;  morphine 2 MG/ML injection 1-4 mg, 1-4 mg, Intravenous, Q1H PRN, Tia Alert, MD, 2 mg at 01/08/13 1958 pantoprazole (PROTONIX) EC tablet 40 mg, 40 mg, Oral, QHS, Tia Alert, MD, 40 mg at 01/09/13 2122;  senna-docusate (Senokot-S) tablet 1 tablet, 1 tablet, Oral, BID, Tia Alert, MD, 1 tablet at 01/10/13 1006;  sulfamethoxazole-trimethoprim (BACTRIM,SEPTRA) 400-80 MG per tablet 1 tablet, 1 tablet, Oral, Q12H, Tia Alert, MD, 1 tablet at 01/10/13 1012  Patients Current Diet: Dysphagia 3 diet with thin  liquids  Precautions / Restrictions Precautions Precautions: Fall Restrictions Weight Bearing Restrictions: No   Prior Activity Level Patient independent without AD with prosthesis. Very active and independent.  Home Assistive Devices / Equipment Home Assistive Devices/Equipment: Shower chair with back;Prosthesis Home Equipment: Environmental consultant - 2 wheels;Tub bench;Bedside commode;Cane - single point  Prior Functional Level Prior Function Level of Independence: Independent;Independent with assistive device(s) (prostheticLLE)  Current Functional Level Cognition  Arousal/Alertness: Awake/alert Overall Cognitive Status: Impaired/Different from baseline Current Attention Level: Focused Orientation Level: Disoriented X4 Following Commands: Follows one step commands inconsistently;Follows one step commands with increased time Safety/Judgement: Decreased awareness of safety;Decreased awareness of deficits General Comments: Pt needed tactile and verbal cues for initiation of all movements.  Receptive aphasia as well as probable expressive aphasia.  Poor overall cognition.   Attention: Focused Focused Attention: Impaired Focused Attention Impairment: Verbal basic;Functional basic (all higher level function impaired by attention deficits) Behaviors: Restless    Extremity Assessment (includes Sensation/Coordination)          ADLs  Eating/Feeding: Other (comment) (able to hold cup and bring to mouth) Grooming: Maximal assistance Where Assessed - Grooming: Supported sitting Upper Body Bathing: Maximal assistance Where Assessed - Upper Body Bathing: Supported sitting Lower Body Dressing: Maximal assistance (total A don right sock shoe, mod A to don RLE prothestic) Where Assessed - Lower Body Dressing: Supported sit to Pharmacist, hospital: +2 Total assistance Toilet Transfer: Patient Percentage: 60% Statistician Method: Sit to Barista:  (Bed> out into  hallway>recliner behind her) Transfers/Ambulation Related to ADLs: total A +2 (pt=60%) for sit<>stand and to ambulate with RW ADL Comments: currently total A for ADL due to lethargy    Mobility  Bed Mobility: Rolling Left;Left Sidelying to Sit;Sitting - Scoot to Edge of Bed Rolling Right: Not tested (comment) Rolling Left: 4: Min assist Right Sidelying to Sit: Not tested (comment) Right Sidelying to Sit: Patient Percentage: 60% Left Sidelying to Sit: 4: Min assist Sitting - Scoot to Edge of Bed: 4: Min guard Sit to Supine: 4: Min assist    Transfers  Transfers: Sit to Stand;Stand to Sit Sit to Stand: 1: +2 Total assist;From elevated surface;With upper extremity assist;From bed Sit to Stand: Patient Percentage: 60% Stand to Sit: 1: +2 Total assist;With upper extremity assist;With armrests;To chair/3-in-1 Stand to Sit: Patient Percentage: 60% Stand Pivot Transfers: Not tested (comment) Stand Pivot Transfers: Patient Percentage: 70%    Ambulation / Gait / Stairs / Wheelchair Mobility  Ambulation/Gait Ambulation/Gait Assistance: 1: +2 Total assist Ambulation/Gait: Patient Percentage: 70% Ambulation Distance (Feet): 56 Feet Assistive device: Rolling walker Ambulation/Gait Assistance Details: Pt ambulated with RW with cues for sequencing steps and RW.  Pt with wide BOS and needed assist  to steer RW as she was unsteady overall.  Needed constant cues to keep walking or pt would stop.  Pt needs constant cues to complete all tasks including each component of walking.   Gait Pattern: Step-through pattern;Decreased stride length;Decreased step length - left;Wide base of support;Trunk flexed;Ataxic Gait velocity: decreased Stairs: No Wheelchair Mobility Wheelchair Mobility: No    Posture / Games developer Sitting - Balance Support: Bilateral upper extremity supported;Feet supported Static Sitting - Level of Assistance: 5: Stand by assistance Static Sitting - Comment/#  of Minutes: sat 10 mins EOB and donned prothesis Static Standing Balance Static Standing - Balance Support: Bilateral upper extremity supported;During functional activity Static Standing - Level of Assistance:  (min guard A) Static Standing - Comment/# of Minutes: Stood with RW 5 min with min guard A needed secondary to postural instability    Special needs/care consideration Bowel mgmt: continent Bladder mgmt: continent    Previous Home Environment Living Arrangements: Spouse/significant other  Lives With: Spouse Available Help at Discharge: Family;Available 24 hours/day Type of Home: House Home Layout: One level Home Access: Stairs to enter Entrance Stairs-Rails: Can reach both Entrance Stairs-Number of Steps: 3 Bathroom Shower/Tub: Associate Professor: Yes How Accessible: Accessible via walker Home Care Services: No  Discharge Living Setting Plans for Discharge Living Setting: [name] home Type of Home at Discharge: House Discharge Home Layout: One level Discharge Home Access: Stairs to enter Entrance Stairs-Rails: Can reach both;Right;Left Entrance Stairs-Number of Steps: 3 steps Discharge Bathroom Shower/Tub: Tub/shower unit Discharge Bathroom Toilet: Standard Discharge Bathroom Accessibility: Yes How Accessible: Accessible via walker Does the patient have any problems obtaining your medications?: No  Social/Family/Support Systems Patient Roles: Partner;Parent Contact Information: [place] Capri, spouse Anticipated Caregiver: spouse, stepdaughter, and children Anticipated Caregiver's Contact Information: see above Ability/Limitations of Caregiver: spouse works, but family very involved Caregiver Availability: 24/7 Discharge Plan Discussed with Primary Caregiver: Yes Is Caregiver In Agreement with Plan?: Yes Does Caregiver/Family have Issues with Lodging/Transportation while Pt is in Rehab?: No  Goals/Additional  Needs Patient/Family Goal for Rehab: supervision to min assist with PT and OT, mod I to supervision with SLP Expected length of stay: ELOS 2 to 3 weeks Pt/Family Agrees to Admission and willing to participate: Yes Program Orientation Provided & Reviewed with Pt/Caregiver Including Roles  & Responsibilities: Yes   Decrease burden of Care through IP rehab admission: n/a  Possible need for SNF placement upon discharge:not anticipated   Patient Condition: This patient's medical and functional status has changed since the consult dated: 01/06/13 in which the Rehabilitation Physician determined and documented that the patient's condition is appropriate for intensive rehabilitative care in an inpatient rehabilitation facility. See "History of Present Illness" (above) for medical update. Functional changes are: overall mod assist. [name] medical and functional status update has been discussed with the Rehabilitation physician and patient remains appropriate for inpatient rehabilitation. Will admit to inpatient rehab today.  Preadmission Screen Completed By:  Clois Dupes, 01/10/2013 11:13 AM ______________________________________________________________________   Discussed status with Dr. Riley Kill on 01/10/09 at 1112 and received telephone approval for admission today.  Admission Coordinator:  Clois Dupes, [id number] Date 01/10/13.

## 2013-01-10 ENCOUNTER — Encounter (HOSPITAL_COMMUNITY): Payer: Self-pay | Admitting: *Deleted

## 2013-01-10 ENCOUNTER — Inpatient Hospital Stay (HOSPITAL_COMMUNITY)
Admission: RE | Admit: 2013-01-10 | Discharge: 2013-01-23 | DRG: 945 | Disposition: A | Discharge status: Home Health | Payer: Medicare Other | Source: Intra-hospital | Attending: Physical Medicine & Rehabilitation | Admitting: Physical Medicine & Rehabilitation

## 2013-01-10 DIAGNOSIS — S065XAA Traumatic subdural hemorrhage with loss of consciousness status unknown, initial encounter: Secondary | ICD-10-CM

## 2013-01-10 DIAGNOSIS — K219 Gastro-esophageal reflux disease without esophagitis: Secondary | ICD-10-CM | POA: Diagnosis present

## 2013-01-10 DIAGNOSIS — Z86711 Personal history of pulmonary embolism: Secondary | ICD-10-CM | POA: Diagnosis not present

## 2013-01-10 DIAGNOSIS — E876 Hypokalemia: Secondary | ICD-10-CM | POA: Diagnosis present

## 2013-01-10 DIAGNOSIS — Z7901 Long term (current) use of anticoagulants: Secondary | ICD-10-CM | POA: Diagnosis not present

## 2013-01-10 DIAGNOSIS — R1032 Left lower quadrant pain: Secondary | ICD-10-CM | POA: Diagnosis present

## 2013-01-10 DIAGNOSIS — I6200 Nontraumatic subdural hemorrhage, unspecified: Secondary | ICD-10-CM | POA: Diagnosis present

## 2013-01-10 DIAGNOSIS — E039 Hypothyroidism, unspecified: Secondary | ICD-10-CM | POA: Diagnosis present

## 2013-01-10 DIAGNOSIS — Z5189 Encounter for other specified aftercare: Principal | ICD-10-CM

## 2013-01-10 DIAGNOSIS — F411 Generalized anxiety disorder: Secondary | ICD-10-CM | POA: Diagnosis present

## 2013-01-10 DIAGNOSIS — S065X9A Traumatic subdural hemorrhage with loss of consciousness of unspecified duration, initial encounter: Secondary | ICD-10-CM

## 2013-01-10 DIAGNOSIS — S88119A Complete traumatic amputation at level between knee and ankle, unspecified lower leg, initial encounter: Secondary | ICD-10-CM

## 2013-01-10 DIAGNOSIS — W19XXXA Unspecified fall, initial encounter: Secondary | ICD-10-CM

## 2013-01-10 DIAGNOSIS — I10 Essential (primary) hypertension: Secondary | ICD-10-CM | POA: Diagnosis present

## 2013-01-10 MED ORDER — ONDANSETRON HCL 4 MG/2ML IJ SOLN: 4.0000 mg | Freq: Four times a day (QID) | INTRAMUSCULAR | Status: DC | PRN

## 2013-01-10 MED ORDER — HYDROCHLOROTHIAZIDE 25 MG PO TABS
25.0000 mg | ORAL_TABLET | Freq: Every day | ORAL | Status: DC
  Administered 2013-01-11 – 2013-01-23 (×13): 25 mg via ORAL
  Filled 2013-01-10 (×14): qty 1

## 2013-01-10 MED ORDER — FUROSEMIDE 40 MG PO TABS
40.0000 mg | ORAL_TABLET | Freq: Two times a day (BID) | ORAL | Status: DC
  Administered 2013-01-11 – 2013-01-23 (×25): 40 mg via ORAL
  Filled 2013-01-10 (×27): qty 1

## 2013-01-10 MED ORDER — LEVOTHYROXINE SODIUM 100 MCG PO TABS
100.0000 ug | ORAL_TABLET | Freq: Every day | ORAL | Status: DC
  Administered 2013-01-11 – 2013-01-23 (×13): 100 ug via ORAL
  Filled 2013-01-10 (×14): qty 1

## 2013-01-10 MED ORDER — LEVETIRACETAM 500 MG PO TABS
500.0000 mg | ORAL_TABLET | Freq: Two times a day (BID) | ORAL | Status: DC
  Administered 2013-01-10 – 2013-01-23 (×26): 500 mg via ORAL
  Filled 2013-01-10 (×29): qty 1

## 2013-01-10 MED ORDER — PANTOPRAZOLE SODIUM 40 MG PO TBEC
40.0000 mg | DELAYED_RELEASE_TABLET | Freq: Every day | ORAL | Status: DC
  Administered 2013-01-10 – 2013-01-22 (×13): 40 mg via ORAL
  Filled 2013-01-10 (×13): qty 1

## 2013-01-10 MED ORDER — ONDANSETRON HCL 4 MG PO TABS
4.0000 mg | ORAL_TABLET | Freq: Four times a day (QID) | ORAL | Status: DC | PRN
  Administered 2013-01-11: 4 mg via ORAL
  Filled 2013-01-10: qty 1

## 2013-01-10 MED ORDER — SENNOSIDES-DOCUSATE SODIUM 8.6-50 MG PO TABS
1.0000 | ORAL_TABLET | Freq: Two times a day (BID) | ORAL | Status: DC
  Administered 2013-01-10 – 2013-01-23 (×25): 1 via ORAL
  Filled 2013-01-10 (×25): qty 1

## 2013-01-10 MED ORDER — SULFAMETHOXAZOLE-TRIMETHOPRIM 400-80 MG PO TABS
1.0000 | ORAL_TABLET | Freq: Two times a day (BID) | ORAL | Status: DC
  Administered 2013-01-10 – 2013-01-23 (×26): 1 via ORAL
  Filled 2013-01-10 (×28): qty 1

## 2013-01-10 MED ORDER — ALBUTEROL SULFATE HFA 108 (90 BASE) MCG/ACT IN AERS: 2.0000 | INHALATION_SPRAY | Freq: Four times a day (QID) | RESPIRATORY_TRACT | Status: DC | PRN

## 2013-01-10 MED ORDER — ACETAMINOPHEN 325 MG PO TABS
325.0000 mg | ORAL_TABLET | ORAL | Status: DC | PRN
  Administered 2013-01-11 – 2013-01-17 (×14): 650 mg via ORAL
  Filled 2013-01-10 (×14): qty 2

## 2013-01-10 MED ORDER — POTASSIUM CHLORIDE CRYS ER 20 MEQ PO TBCR
20.0000 meq | EXTENDED_RELEASE_TABLET | Freq: Every day | ORAL | Status: DC
  Administered 2013-01-10: 20 meq via ORAL
  Filled 2013-01-10 (×4): qty 1

## 2013-01-10 MED ORDER — GABAPENTIN 600 MG PO TABS
600.0000 mg | ORAL_TABLET | Freq: Two times a day (BID) | ORAL | Status: DC
  Administered 2013-01-10 – 2013-01-23 (×26): 600 mg via ORAL
  Filled 2013-01-10 (×29): qty 1

## 2013-01-10 MED ORDER — SORBITOL 70 % SOLN: 30.0000 mL | Freq: Every day | Status: DC | PRN

## 2013-01-10 NOTE — H&P (View-Only) (Signed)
Physical Medicine and Rehabilitation Admission H&P    Chief Complaint  Patient presents with  . Altered Mental Status  : HPI: Frances Finley is a 57 y.o. 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 Admitted 01/05/2013 with progressive change in mental status denies any recent trauma. Cranial CT scan showed acute bilateral subdural hematomas, right greater than left. Followup neurosurgery Dr. Marikay Alar. INR on admission of 2.15 and was reversed. Advise conservative care of subdural hematoma with serial followup cranial CT scans that remained stable. Followup CT of the brain 01/09/2013 with no new hemorrhage and no change in the approximately 3 mm midline shift from left-to-right . Keppra was added for seizure prophylaxis. Physical occupational therapy evaluations completed an ongoing. Recommendations have been made for physical medicine rehabilitation consult to consider inpatient rehabilitation services. Patient was felt to be a good candidate for inpatient rehabilitation services and was admitted for comprehensive rehabilitation program today.   Review of Systems  Unable to perform ROS: language, staff/family report that she fatigues easily. Has been sleeping well.    Past Medical History  Diagnosis Date  . GERD (gastroesophageal reflux disease)   . DVT (deep venous thrombosis)   . PE (pulmonary embolism)   . SLE inhibitor syndrome   . Aortic thromboembolism   . Hypothyroidism   . Hypertension   . Iron deficiency anemia    Past Surgical History  Procedure Laterality Date  . Below knee leg amputation  2003  . Cholecystectomy    . Abdominal hysterectomy     No family history on file. Social History:  reports that she has quit smoking. She does not have any smokeless tobacco history on file. She reports that  drinks alcohol. She reports that she does not use illicit drugs. Allergies: No Known  Allergies Medications Prior to Admission  Medication Sig Dispense Refill  . albuterol (PROVENTIL HFA;VENTOLIN HFA) 108 (90 BASE) MCG/ACT inhaler Inhale 2 puffs into the lungs every 6 (six) hours as needed for wheezing.      . cyclobenzaprine (FLEXERIL) 10 MG tablet Take 10 mg by mouth 3 (three) times daily as needed for muscle spasms.      . diazepam (VALIUM) 5 MG tablet Take 5 mg by mouth every 6 (six) hours as needed for anxiety.      Marland Kitchen esomeprazole (NEXIUM) 40 MG capsule Take 40 mg by mouth 2 (two) times daily.       Marland Kitchen gabapentin (NEURONTIN) 600 MG tablet Take 600 mg by mouth 2 (two) times daily.      Marland Kitchen HYDROCHLOROTHIAZIDE PO Take 25 mg by mouth daily.      Marland Kitchen levothyroxine (SYNTHROID, LEVOTHROID) 100 MCG tablet Take 100 mcg by mouth daily before breakfast.      . oxyCODONE (OXYCONTIN) 10 MG 12 hr tablet Take 20 mg by mouth 3 (three) times daily.      . potassium chloride (K-DUR,KLOR-CON) 10 MEQ tablet Take 10 mEq by mouth daily.      Marland Kitchen senna (SENOKOT) 8.6 MG tablet Take 1 tablet by mouth daily.      . sennosides-docusate sodium (SENOKOT-S) 8.6-50 MG tablet Take 1 tablet by mouth 2 (two) times daily.      . simvastatin (ZOCOR) 20 MG tablet Take 20 mg by mouth every evening.      . triamcinolone cream (KENALOG) 0.1 % Apply 1 application topically 2 (two) times daily.      . Vitamin D, Ergocalciferol, (  DRISDOL) 50000 UNITS CAPS capsule Take 50,000 Units by mouth every 7 (seven) days. Family not sure of what day she takes it      . warfarin (COUMADIN) 5 MG tablet Take 5 mg by mouth daily.      Marland Kitchen azithromycin (ZITHROMAX) 250 MG tablet Take 250 mg by mouth daily. Two tablets on day one and 1 tablet for the next 4 days.      . furosemide (LASIX) 40 MG tablet Take 40 mg by mouth 2 (two) times daily. For 6 days.        Home: Home Living Family/patient expects to be discharged to:: Private residence Living Arrangements: Spouse/significant other Available Help at Discharge: Family;Available 24  hours/day Type of Home: House Home Access: Stairs to enter [employer] of Steps: 3 Entrance Stairs-Rails: Can reach both Home Layout: One level Home Equipment: Walker - 2 wheels;Tub bench;Bedside commode;Cane - single point  Lives With: Spouse   Functional History:    Functional Status:  Mobility: Bed Mobility Bed Mobility: Sit to Supine Rolling Right: 2: Max assist Rolling Left: 2: Max assist Right Sidelying to Sit: 1: +2 Total assist;HOB flat Right Sidelying to Sit: Patient Percentage: 60% Sitting - Scoot to Edge of Bed: 3: Mod assist Sit to Supine: 4: Min assist Transfers Transfers: Sit to Stand;Stand to Sit;Stand Pivot Transfers Sit to Stand: 1: +2 Total assist;With upper extremity assist;With armrests;From chair/3-in-1 Sit to Stand: Patient Percentage: 60% Stand to Sit: 1: +2 Total assist;With upper extremity assist;With armrests;To chair/3-in-1 Stand to Sit: Patient Percentage: 50% Stand Pivot Transfers: 1: +2 Total assist Stand Pivot Transfers: Patient Percentage: 70% Ambulation/Gait Ambulation/Gait Assistance: Not tested (comment) Stairs: No Wheelchair Mobility Wheelchair Mobility: No  ADL: ADL Eating/Feeding: Other (comment) (able to hold cup and bring to mouth) Grooming: Maximal assistance Where Assessed - Grooming: Supported sitting Upper Body Bathing: Maximal assistance Where Assessed - Upper Body Bathing: Supported sitting Transfers/Ambulation Related to ADLs: not assessed at this time. Will need +2 ADL Comments: currently total A for ADL due to lethargy  Cognition: Cognition Overall Cognitive Status: Impaired/Different from baseline Arousal/Alertness: Awake/alert Orientation Level: Disoriented to place;Disoriented to situation;Disoriented to time;Disoriented to person Attention: Focused Focused Attention: Impaired Focused Attention Impairment: Verbal basic;Functional basic (all higher level function impaired by attention  deficits) Behaviors: Restless Cognition Arousal/Alertness: Lethargic Behavior During Therapy: Flat affect Overall Cognitive Status: Impaired/Different from baseline Area of Impairment: Orientation;Attention;Following commands;Safety/judgement;Awareness;Problem solving;Memory Orientation Level: Disoriented to;Place;Time;Situation Current Attention Level: Focused Following Commands: Follows one step commands inconsistently Safety/Judgement: Decreased awareness of safety;Decreased awareness of deficits Awareness: Intellectual Problem Solving: Slow processing;Decreased initiation;Difficulty sequencing;Requires verbal cues;Requires tactile cues  Physical Exam: Blood pressure 146/59, pulse 83, temperature 97.5 F (36.4 C), temperature source Oral, resp. rate 23, height 5\' 9"  (1.753 m), weight 95 kg (209 lb 7 oz), SpO2 99.00%.    Gen: pt is difficult to arouse HENT:  Head: Normocephalic.  Eyes: EOM are normal.  No nystagmus  Neck: Normal range of motion. Neck supple. No thyromegaly present.  Cardiovascular: Normal rate and regular rhythm. No murmurs Pulmonary/Chest: Effort normal and breath sounds normal. No respiratory distress. No wheezes Abdominal: Soft. Bowel sounds are normal. She exhibits no distension.  Musculoskeletal:  Left BKA. Limb well-formed, non-tender Neurological:  Patient is lethargic, difficult to arouse.  She was also inconsistent to follow simple commands. Moved all 4's fairly spontaneously to pain sense.. Sensed pain in all 4's. Could answer simple questions.  Skin: Skin is warm and dry.  Psychiatric:  Lethargic.  Results for orders placed during the hospital encounter of 01/04/13 (from the past 48 hour(s))  BASIC METABOLIC PANEL     Status: Abnormal   Collection Time    01/07/13  2:59 PM      Result Value Range   Sodium 132 (*) 135 - 145 mEq/L   Potassium 3.6  3.5 - 5.1 mEq/L   Comment: DELTA CHECK NOTED   Chloride 91 (*) 96 - 112 mEq/L   CO2 24  19 - 32  mEq/L   Glucose, Bld 97  70 - 99 mg/dL   BUN 9  6 - 23 mg/dL   Creatinine, Ser 1.61  0.50 - 1.10 mg/dL   Calcium 9.6  8.4 - 09.6 mg/dL   GFR calc non Af Amer >90  >90 mL/min   GFR calc Af Amer >90  >90 mL/min   Comment: (NOTE)     The eGFR has been calculated using the CKD EPI equation.     This calculation has not been validated in all clinical situations.     eGFR's persistently <90 mL/min signify possible Chronic Kidney     Disease.   No results found.  Post Admission Physician Evaluation: 1. Functional deficits secondary  to bilateral SDH's likely due to fall. 2. Patient is admitted to receive collaborative, interdisciplinary care between the physiatrist, rehab nursing staff, and therapy team. 3. [name] level of medical complexity and substantial therapy needs in context of that medical necessity cannot be provided at a lesser intensity of care such as a SNF. 4. Patient has experienced substantial functional loss from his/her baseline which was documented above under the "Functional History" and "Functional Status" headings.  Judging by the patient's diagnosis, physical exam, and functional history, the patient has potential for functional progress which will result in measurable gains while on inpatient rehab.  These gains will be of substantial and practical use upon discharge  in facilitating mobility and self-care at the household level. 5. Physiatrist will provide 24 hour management of medical needs as well as oversight of the therapy plan/treatment and provide guidance as appropriate regarding the interaction of the two. 6. 24 hour rehab nursing will assist with bladder management, bowel management, safety, skin/wound care, disease management, medication administration, pain management and patient education  and help integrate therapy concepts, techniques,education, etc. 7. PT will assess and treat for/with: Lower extremity strength, range of motion, stamina, balance, functional  mobility, safety, adaptive techniques and equipment, NMR, education.   Goals are: supervision to minimal assist. 8. OT will assess and treat for/with: ADL's, functional mobility, safety, upper extremity strength, adaptive techniques and equipment, NMR, education.   Goals are: supervision to minimal assist. 9. SLP will assess and treat for/with: cognition, communication.  Goals are: supervision. 10. Case Management and Social Worker will assess and treat for psychological issues and discharge planning. 11. Team conference will be held weekly to assess progress toward goals and to determine barriers to discharge. 12. Patient will receive at least 3 hours of therapy per day at least 5 days per week. 13. ELOS: 2-3 weeks       14. Prognosis:  excellent   Medical Problem List and Plan: 1. Bilateral subdural hematomas after recent/remote fall 2. DVT Prophylaxis/Anticoagulation: SCDs. Monitor for signs of DVT. History of DVT and pulmonary emboli. Coumadin discontinued secondary to subdural hematoma 3. Pain Management: Neurontin 600 mg twice a day, Tylenol as needed. Monitor with increased mobility. Patient was taking OxyContin sustained-release 10 mg 3 times a day prior  to admission. Plan to resume as/when tolerated and needed. 4. Mood/anxiety. Valium 5 mg every 6 hours as needed--limit as possible given neuro-cognitive deficits 5. Neuropsych: This patient is not capable of making decisions on her own behalf. 6. Seizure prophylaxis. Keppra 500 mg twice a day. Monitor for any signs of seizure 7. History of left BKA. Patient does have a prosthesis 8. Hypothyroidism. Synthroid 9. Hypertension. Lasix 40 mg twice a day, hydrochlorothiazide 25 mg daily. Monitor with increased activity 10. Hypokalemia. Continue supplement and followup labs  Ranelle Oyster, MD, [facility] Physical Medicine & Rehabilitation  01/09/2013

## 2013-01-10 NOTE — Progress Notes (Signed)
PT Cancellation Note  Patient Details Name: Frances Finley MRN: [id number] DOB: [birth year]   Cancelled Treatment:    Reason Eval/Treat Not Completed: Patient in a deep sleep and unable to wake her up today. Noted plan for d/c to CIR today. Will hold and defer further therapy to CIR.   Matan Steen H 01/10/2013, 1:20 PM

## 2013-01-10 NOTE — Progress Notes (Signed)
Pt arrived on unit approx 1840

## 2013-01-10 NOTE — Interval H&P Note (Signed)
[place] was admitted today to Inpatient Rehabilitation with the diagnosis of bilateral SDH's.  The patient's history has been reviewed, patient examined, and there is no change in status.  Patient continues to be appropriate for intensive inpatient rehabilitation.  I have reviewed the patient's chart and labs.  Questions were answered to the patient's satisfaction.  SWARTZ,ZACHARY T 01/10/2013, 7:15 PM

## 2013-01-10 NOTE — Progress Notes (Signed)
Insurance has approved and bed is available on inpt rehab today. I will make arrangements to admit. I have spoken with pt and her daughter at bedside as well as contacted her husband by phone. I will contact Dr. Yetta Barre. RN CM is aware. [phone]

## 2013-01-10 NOTE — Progress Notes (Signed)
Patient ID: Frances Finley, female   DOB: 1955/06/06, 57 y.o.   MRN: [id number] Pt stable. Awake/ alert, eating breakfast. Some headache. Good fluency, poor naming. MAEx4. Stable for d/c to CIR when bed available.

## 2013-01-10 NOTE — Clinical Social Work Note (Signed)
CSW attempted to meet with pt and pt's family to discuss SNF placement as back-up for CIR. Pt's daughter, Shanda Bumps, asked that CSW return later once pt's husband, Lindie Spruce, had arrived at [facility]. CSW received notice that pt is to be discharged to CIR today.  CSW signing off on pt. Please re-consult if needed.  Darlyn Chamber, MSW, LCSWA Clinical Social Work [phone]

## 2013-01-10 NOTE — Discharge Summary (Signed)
Physician Discharge Summary  Patient ID: Frances Finley MRN: [id number] DOB/AGE: 05/11/1955 57 y.o.  Admit date: 01/04/2013 Discharge date: 01/10/2013  Admission Diagnoses: SDH   Discharge Diagnoses: SDH   Discharged Condition: fair  Hospital Course: The patient was admitted on 01/04/2013 with acute SDH. She was admitted to the ICU. Follow up head CT was stable. The hospital course was routine. There were no complications. She was treated for a UTI that was present upon admission. The patient remained afebrile with stable vital signs, and tolerated a regular diet. She worked with PT/OT and her MS improved though she had an aphasia.The patient continued to increase activities, and pain was well controlled with oral pain medications.   Consults: rehabilitation medicine  Significant Diagnostic Studies:  Results for orders placed during the hospital encounter of 01/04/13  URINE CULTURE      Result Value Range   Specimen Description URINE, CATHETERIZED     Special Requests NONE     Culture  Setup Time       Value: 01/05/2013 10:26     Performed at [facility] Count       Value: NO GROWTH     Performed at [facility]   Culture       Value: NO GROWTH     Performed at [facility]   Report Status 01/06/2013 FINAL    MRSA PCR SCREENING      Result Value Range   MRSA by PCR NEGATIVE  NEGATIVE  CBC      Result Value Range   WBC 12.2 (*) 4.0 - 10.5 K/uL   RBC 5.31 (*) 3.87 - 5.11 MIL/uL   Hemoglobin 17.0 (*) 12.0 - 15.0 g/dL   HCT 78.2 (*) 95.6 - 21.3 %   MCV 88.1  78.0 - 100.0 fL   MCH 32.0  26.0 - 34.0 pg   MCHC 36.3 (*) 30.0 - 36.0 g/dL   RDW 08.6  57.8 - 46.9 %   Platelets 378  150 - 400 K/uL  COMPREHENSIVE METABOLIC PANEL      Result Value Range   Sodium 132 (*) 135 - 145 mEq/L   Potassium 3.5  3.5 - 5.1 mEq/L   Chloride 87 (*) 96 - 112 mEq/L   CO2 30  19 - 32 mEq/L   Glucose, Bld 101 (*) 70 - 99 mg/dL   BUN 12  6 - 23 mg/dL   Creatinine, Ser 6.29  0.50 - 1.10 mg/dL   Calcium 9.9  8.4 - 52.8 mg/dL   Total Protein 8.3  6.0 - 8.3 g/dL   Albumin 4.2  3.5 - 5.2 g/dL   AST 37  0 - 37 U/L   ALT 40 (*) 0 - 35 U/L   Alkaline Phosphatase 58  39 - 117 U/L   Total Bilirubin 1.2  0.3 - 1.2 mg/dL   GFR calc non Af Amer >90  >90 mL/min   GFR calc Af Amer >90  >90 mL/min  URINALYSIS, ROUTINE W REFLEX MICROSCOPIC      Result Value Range   Color, Urine AMBER (*) YELLOW   APPearance TURBID (*) CLEAR   Specific Gravity, Urine 1.024  1.005 - 1.030   pH 5.5  5.0 - 8.0   Glucose, UA NEGATIVE  NEGATIVE mg/dL   Hgb urine dipstick MODERATE (*) NEGATIVE   Bilirubin Urine MODERATE (*) NEGATIVE   Ketones, ur >80 (*) NEGATIVE mg/dL   Protein, ur 30 (*) NEGATIVE mg/dL  Urobilinogen, UA 1.0  0.0 - 1.0 mg/dL   Nitrite NEGATIVE  NEGATIVE   Leukocytes, UA MODERATE (*) NEGATIVE  GLUCOSE, CAPILLARY      Result Value Range   Glucose-Capillary 114 (*) 70 - 99 mg/dL  PROTIME-INR      Result Value Range   Prothrombin Time 23.3 (*) 11.6 - 15.2 seconds   INR 2.15 (*) 0.00 - 1.49  ACETAMINOPHEN LEVEL      Result Value Range   Acetaminophen (Tylenol), Serum <15.0  10 - 30 ug/mL  URINE RAPID DRUG SCREEN (HOSP PERFORMED)      Result Value Range   Opiates NONE DETECTED  NONE DETECTED   Cocaine NONE DETECTED  NONE DETECTED   Benzodiazepines POSITIVE (*) NONE DETECTED   Amphetamines NONE DETECTED  NONE DETECTED   Tetrahydrocannabinol NONE DETECTED  NONE DETECTED   Barbiturates NONE DETECTED  NONE DETECTED  URINE MICROSCOPIC-ADD ON      Result Value Range   Squamous Epithelial / LPF RARE  RARE   WBC, UA 7-10  <3 WBC/hpf   RBC / HPF 3-6  <3 RBC/hpf   Bacteria, UA MANY (*) RARE  PROTIME-INR      Result Value Range   Prothrombin Time 14.4  11.6 - 15.2 seconds   INR 1.14  0.00 - 1.49  BASIC METABOLIC PANEL      Result Value Range   Sodium 134 (*) 135 - 145 mEq/L   Potassium 2.7 (*) 3.5 - 5.1 mEq/L   Chloride 96  96 - 112 mEq/L   CO2  24  19 - 32 mEq/L   Glucose, Bld 98  70 - 99 mg/dL   BUN 10  6 - 23 mg/dL   Creatinine, Ser 3.08 (*) 0.50 - 1.10 mg/dL   Calcium 8.7  8.4 - 65.7 mg/dL   GFR calc non Af Amer >90  >90 mL/min   GFR calc Af Amer >90  >90 mL/min  BASIC METABOLIC PANEL      Result Value Range   Sodium 133 (*) 135 - 145 mEq/L   Potassium 2.7 (*) 3.5 - 5.1 mEq/L   Chloride 90 (*) 96 - 112 mEq/L   CO2 26  19 - 32 mEq/L   Glucose, Bld 99  70 - 99 mg/dL   BUN 8  6 - 23 mg/dL   Creatinine, Ser 8.46  0.50 - 1.10 mg/dL   Calcium 9.4  8.4 - 96.2 mg/dL   GFR calc non Af Amer >90  >90 mL/min   GFR calc Af Amer >90  >90 mL/min  BASIC METABOLIC PANEL      Result Value Range   Sodium 132 (*) 135 - 145 mEq/L   Potassium 3.6  3.5 - 5.1 mEq/L   Chloride 91 (*) 96 - 112 mEq/L   CO2 24  19 - 32 mEq/L   Glucose, Bld 97  70 - 99 mg/dL   BUN 9  6 - 23 mg/dL   Creatinine, Ser 9.52  0.50 - 1.10 mg/dL   Calcium 9.6  8.4 - 84.1 mg/dL   GFR calc non Af Amer >90  >90 mL/min   GFR calc Af Amer >90  >90 mL/min  PREPARE FRESH FROZEN PLASMA      Result Value Range   Unit Number [id number]     Blood Component Type THAWED PLASMA     Unit division 00     Status of Unit ISSUED,FINAL     Transfusion Status OK TO TRANSFUSE  Unit Number [id number]     Blood Component Type THAWED PLASMA     Unit division 00     Status of Unit ISSUED,FINAL     Transfusion Status OK TO TRANSFUSE    TYPE AND SCREEN      Result Value Range   ABO/RH(D) A POS     Antibody Screen NEG     Sample Expiration 01/08/2013    ABO/RH      Result Value Range   ABO/RH(D) A POS      Ct Head Wo Contrast  01/09/2013   *RADIOLOGY REPORT*  Clinical Data: Bilateral subdural hematomas.  CT HEAD WITHOUT CONTRAST  Technique:  Contiguous axial images were obtained from the base of the skull through the vertex without contrast.  Comparison: 09/05 and 01/05/2013  Findings: There is no new intracranial hemorrhage.  No change in the approximately 3 mm midline  shift from left to right.  The subdural hematomas on the right and left are slightly less apparent consistent with aging of the blood.  No osseous abnormality.  IMPRESSION:  1.  Aging of the bilateral subdural hematomas.  No new hemorrhage. 2.  Stable slight midline shift from left to right.   Original Report Authenticated By: Francene Boyers, M.D.   Ct Head Wo Contrast  01/06/2013   *RADIOLOGY REPORT*  Clinical Data: Evaluate subdural hematoma.  CT HEAD WITHOUT CONTRAST  Technique:  Contiguous axial images were obtained from the base of the skull through the vertex without contrast.  Comparison: Head CT from yesterday.  Findings: No acute osseous or soft tissue abnormality in the interim.  Bilateral acute cerebral convexity subdural hematomas:  The left hematoma is more extensive, with small components present along the falx, convexity at the vertex, left tentorium, and left posterior parietal and occipital regions.  The thickness component is in the occipital region, deforming the underlying brain, measuring 15 mm in maximal thickness.  Just anteriorly, there is a more extensive portion which measures 11 mm in thickness.  On the right, the density is not as high.  A globular component in the right temporal parietal region continues to measures 1 centimeter. Slight rightward shift is stable from prior, 3 mm in maximal.  No evidence of acute ischemia or hydrocephalus.  IMPRESSION: Unchanged size and extent of bilateral cerebral convexity subdural hematomas, left larger than right. Unchanged mild rightward midline shift.   Original Report Authenticated By: Tiburcio Pea   Ct Head Wo Contrast  01/05/2013   *RADIOLOGY REPORT*  Clinical Data: Altered mental status  CT HEAD WITHOUT CONTRAST  Technique:  Contiguous axial images were obtained from the base of the skull through the vertex without contrast.  Comparison: None.  Findings: Positive for acute left subdural hemorrhage.  The hemorrhage is most prominent in the  left parieto-occipital region. Subdural hemorrhage measures up to 13 mm in thickness posteriorly. Acute subdural blood also layers over the tentorium especially on the left. Suspect a small amount of subdural hemorrhage along the falx cerebral posteriorly (images 25 and 26).  There is a smaller volume of subdural hemorrhage on the right, focally at the level of the posterior right frontal region on image number 26 (measures up to 5 mm in thickness), and in the right parietal region focally on images 18-21, where it measures up to 11 mm in thickness.  The ventricles are normal in size.  Negative for midline shift.  No evidence of acute cortically based infarction.  No skull fracture is identified.  Small  amount of fluid in the left mastoid air cells.  The visualized paranasal sinuses are clear. Soft tissues of the orbits are symmetric.  No focal scalp abnormality is seen.  IMPRESSION: Acute bilateral subdural hematomas, right greater than left. Critical Value/emergent results were called by telephone at the time of interpretation on 01/05/2013 at 1:13 am to [facility], [place], who verbally acknowledged these results.   Original Report Authenticated By: Britta Mccreedy, M.D.    Antibiotics:  Anti-infectives   Start     Dose/Rate Route Frequency Ordered Stop   01/06/13 1000  sulfamethoxazole-trimethoprim (BACTRIM,SEPTRA) 400-80 MG per tablet 1 tablet     1 tablet Oral Every 12 hours 01/06/13 0908     01/05/13 1000  azithromycin (ZITHROMAX) tablet 250 mg  Status:  Discontinued     250 mg Oral Daily 01/05/13 0402 01/05/13 0404      Discharge Exam: Blood pressure 112/77, pulse 107, temperature 97.8 F (36.6 C), temperature source Oral, resp. rate 18, height 5\' 9"  (1.753 m), weight 95 kg (209 lb 7 oz), SpO2 98.00%. Neurologic: Grossly normal except stable aphasia   Discharge Medications:     Medication List    TAKE these medications       albuterol 108 (90 BASE) MCG/ACT inhaler  Commonly known as:   PROVENTIL HFA;VENTOLIN HFA  Inhale 2 puffs into the lungs every 6 (six) hours as needed for wheezing.     azithromycin 250 MG tablet  Commonly known as:  ZITHROMAX  Take 250 mg by mouth daily. Two tablets on day one and 1 tablet for the next 4 days.     diazepam 5 MG tablet  Commonly known as:  VALIUM  Take 5 mg by mouth every 6 (six) hours as needed for anxiety.     esomeprazole 40 MG capsule  Commonly known as:  NEXIUM  Take 40 mg by mouth 2 (two) times daily.     furosemide 40 MG tablet  Commonly known as:  LASIX  Take 40 mg by mouth 2 (two) times daily. For 6 days.     gabapentin 600 MG tablet  Commonly known as:  NEURONTIN  Take 600 mg by mouth 2 (two) times daily.     HYDROCHLOROTHIAZIDE PO  Take 25 mg by mouth daily.     levothyroxine 100 MCG tablet  Commonly known as:  SYNTHROID, LEVOTHROID  Take 100 mcg by mouth daily before breakfast.     oxyCODONE 10 MG 12 hr tablet  Commonly known as:  OXYCONTIN  Take 20 mg by mouth 3 (three) times daily.     potassium chloride 10 MEQ tablet  Commonly known as:  K-DUR,KLOR-CON  Take 10 mEq by mouth daily.     senna 8.6 MG tablet  Commonly known as:  SENOKOT  Take 1 tablet by mouth daily.     sennosides-docusate sodium 8.6-50 MG tablet  Commonly known as:  SENOKOT-S  Take 1 tablet by mouth 2 (two) times daily.     simvastatin 20 MG tablet  Commonly known as:  ZOCOR  Take 20 mg by mouth every evening.     triamcinolone cream 0.1 %  Commonly known as:  KENALOG  Apply 1 application topically 2 (two) times daily.     Vitamin D (Ergocalciferol) 50000 UNITS Caps capsule  Commonly known as:  DRISDOL  Take 50,000 Units by mouth every 7 (seven) days. Family not sure of what day she takes it     warfarin 5 MG tablet  Commonly  known as:  COUMADIN  Take 5 mg by mouth daily.      ASK your doctor about these medications       cyclobenzaprine 10 MG tablet  Commonly known as:  FLEXERIL  Take 10 mg by mouth 3 (three)  times daily as needed for muscle spasms.        Disposition: CIR   Final Dx: SDH      Discharge Orders   Future Orders Complete By Expires   Diet - low sodium heart healthy  As directed    Increase activity slowly  As directed       Follow-up Information   Follow up with JONES,DAVID S, MD. Schedule an appointment as soon as possible for a visit in 2 weeks.   Specialty:  Neurosurgery   Contact information:   [address] [place] [address] [phone]        Signed: Tia Alert 01/10/2013, 1:16 PM

## 2013-01-11 ENCOUNTER — Inpatient Hospital Stay (HOSPITAL_COMMUNITY): Payer: Medicare Other | Admitting: Speech Pathology

## 2013-01-11 ENCOUNTER — Inpatient Hospital Stay (HOSPITAL_COMMUNITY): Payer: Medicare Other | Admitting: Occupational Therapy

## 2013-01-11 ENCOUNTER — Inpatient Hospital Stay (HOSPITAL_COMMUNITY): Payer: Medicare Other | Admitting: Physical Therapy

## 2013-01-11 DIAGNOSIS — S065X9A Traumatic subdural hemorrhage with loss of consciousness of unspecified duration, initial encounter: Secondary | ICD-10-CM

## 2013-01-11 DIAGNOSIS — S88119A Complete traumatic amputation at level between knee and ankle, unspecified lower leg, initial encounter: Secondary | ICD-10-CM

## 2013-01-11 DIAGNOSIS — W19XXXA Unspecified fall, initial encounter: Secondary | ICD-10-CM

## 2013-01-11 DIAGNOSIS — S065XAA Traumatic subdural hemorrhage with loss of consciousness status unknown, initial encounter: Secondary | ICD-10-CM

## 2013-01-11 LAB — CBC WITH DIFFERENTIAL/PLATELET
Basophils Absolute: 0.1 10*3/uL (ref 0.0–0.1)
Eosinophils Absolute: 0.3 10*3/uL (ref 0.0–0.7)
Eosinophils Relative: 2 % (ref 0–5)
HCT: 51 % — ABNORMAL HIGH (ref 36.0–46.0)
MCH: 32.5 pg (ref 26.0–34.0)
MCHC: 36.5 g/dL — ABNORMAL HIGH (ref 30.0–36.0)
MCV: 89.2 fL (ref 78.0–100.0)
Monocytes Absolute: 0.9 10*3/uL (ref 0.1–1.0)
Platelets: 448 10*3/uL — ABNORMAL HIGH (ref 150–400)
RDW: 13.3 % (ref 11.5–15.5)

## 2013-01-11 LAB — COMPREHENSIVE METABOLIC PANEL
ALT: 81 U/L — ABNORMAL HIGH (ref 0–35)
AST: 44 U/L — ABNORMAL HIGH (ref 0–37)
CO2: 26 mEq/L (ref 19–32)
Calcium: 10.1 mg/dL (ref 8.4–10.5)
Creatinine, Ser: 0.99 mg/dL (ref 0.50–1.10)
GFR calc Af Amer: 73 mL/min — ABNORMAL LOW (ref >90)
GFR calc non Af Amer: 63 mL/min — ABNORMAL LOW (ref >90)
Sodium: 127 mEq/L — ABNORMAL LOW (ref 135–145)
Total Protein: 8.2 g/dL (ref 6.0–8.3)

## 2013-01-11 MED ORDER — POTASSIUM CHLORIDE CRYS ER 20 MEQ PO TBCR
20.0000 meq | EXTENDED_RELEASE_TABLET | Freq: Two times a day (BID) | ORAL | Status: DC
  Administered 2013-01-11 – 2013-01-23 (×25): 20 meq via ORAL
  Filled 2013-01-11 (×26): qty 1

## 2013-01-11 MED ORDER — ENSURE COMPLETE PO LIQD
237.0000 mL | Freq: Two times a day (BID) | ORAL | Status: DC
  Administered 2013-01-11 – 2013-01-22 (×18): 237 mL via ORAL

## 2013-01-11 NOTE — Evaluation (Signed)
Physical Therapy Assessment and Plan  Patient Details  Name: Frances Finley MRN: [id number] Date of Birth: 05/21/1955  PT Diagnosis: Difficulty walking and Impaired cognition Rehab Potential: Good ELOS: 2 weeks   Today's Date: 01/11/2013 Time: 0730-0825 Time Calculation (min): 55 min  Problem List:  Patient Active Problem List   Diagnosis Date Noted  . SDH (subdural hematoma) 01/11/2013  . HEMOCHROMATOSIS 07/31/2008  . ANXIETY 07/31/2008  . 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 SINUSITIS, ACUTE 07/31/2008  . GERD 07/31/2008  . OTHER&UNSPEC NONSPECIFIC IMMUNOLOGICAL FINDINGS 07/31/2008  . HEMOCHROMATOSIS 07/31/2008    Past Medical History:  Past Medical History  Diagnosis Date  . GERD (gastroesophageal reflux disease)   . DVT (deep venous thrombosis)   . PE (pulmonary embolism)   . SLE inhibitor syndrome   . Aortic thromboembolism   . Hypothyroidism   . Hypertension   . Iron deficiency anemia    Past Surgical History:  Past Surgical History  Procedure Laterality Date  . Below knee leg amputation  2003  . Cholecystectomy    . Abdominal hysterectomy      Assessment & Plan Clinical Impression: Frances Finley is a 57 y.o. 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 Admitted 01/05/2013 with progressive change in mental status denies any recent trauma. Cranial CT scan showed acute bilateral subdural hematomas, right greater than left. Followup neurosurgery Dr. Marikay Alar. INR on admission of 2.15 and was reversed. Advise conservative care of subdural hematoma with serial followup cranial CT scans that remained stable. Followup CT of the brain 01/09/2013 with no new hemorrhage and no change in the approximately 3 mm midline shift from left-to-right . Keppra was added for seizure prophylaxis.  Patient transferred to CIR on 01/10/2013 .   Patient currently requires mod with mobility  secondary to decreased standing balance.  Prior to hospitalization, patient was independent  with mobility and lived with Spouse in a House home.  Home access is 3Stairs to enter.  Patient will benefit from skilled PT intervention to maximize safe functional mobility, minimize fall risk and decrease caregiver burden for planned discharge home, but unsure if family/friends are able to provide 24hr S.  Anticipate patient will benefit from follow up HH at discharge.  PT - End of Session Activity Tolerance: Endurance does not limit participation in activity Endurance Deficit: No PT Assessment Rehab Potential: Good PT Patient demonstrates impairments in the following area(s): Balance;Motor;Safety PT Transfers Functional Problem(s): Bed Mobility;Bed to Chair;Car;Furniture PT Locomotion Functional Problem(s): Ambulation;Wheelchair Mobility;Stairs PT Plan PT Intensity: Minimum of 1-2 x/day ,45 to 90 minutes PT Frequency: 5 out of 7 days PT Duration Estimated Length of Stay: 2 weeks PT Treatment/Interventions: Ambulation/gait training;Balance/vestibular training;Cognitive remediation/compensation;Discharge planning;DME/adaptive equipment instruction;Functional mobility training;Neuromuscular re-education;Patient/family education;Stair training;Therapeutic Exercise;Therapeutic Activities;UE/LE Strength taining/ROM;UE/LE Coordination activities;Wheelchair propulsion/positioning;Visual/perceptual remediation/compensation PT Transfers Anticipated Outcome(s): Mod I PT Locomotion Anticipated Outcome(s): Mod I in home environment, MinA in community PT Recommendation Follow Up Recommendations: Home health PT;24 hour supervision/assistance Patient destination: Home Equipment Recommended: None recommended by PT Equipment Details: pt has RW and tub bench  Skilled Therapeutic Intervention   PT Evaluation Precautions/Restrictions Precautions Precautions: Fall Precaution Comments: pt with L Prosthetic  leg Restrictions Weight Bearing Restrictions: No General Chart Reviewed: Yes Family/Caregiver Present: Yes Vital SignsTherapy Vitals Temp: 98.4 F (36.9 C) Temp src: Oral Pulse Rate: 101 Resp: 18 BP: 122/84 mmHg Patient Position, if appropriate: Lying Oxygen Therapy SpO2: 98 % O2 Device: None (Room air) Pain  PAINAD (Pain Assessment in Advanced Dementia) Facial Expression: smiling or inexpressive Home Living/Prior Functioning Home Living Available Help at Discharge: Family;Available PRN/intermittently Type of Home: House Home Access: Stairs to enter [employer] of Steps: 3 Entrance Stairs-Rails: Right;Left;Can reach both Home Layout: One level  Lives With: Spouse Prior Function Level of Independence: Independent with basic ADLs;Independent with homemaking with ambulation  Able to Take Stairs?: Yes Vision/Perception     Cognition Overall Cognitive Status: Impaired/Different from baseline (difficult to assess cognition 2/2 expressive difficulties.  ) Arousal/Alertness: Awake/alert Orientation Level: Oriented to person Attention: Focused Sensation Sensation Additional Comments: Unable to assess sensation 2/2 cognitive and expressive difficulties, though does respond when PT touches pt's legs.   Coordination Gross Motor Movements are Fluid and Coordinated: Not tested Fine Motor Movements are Fluid and Coordinated: Not tested Motor  Motor Motor: Within Functional Limits  Mobility Bed Mobility Right Sidelying to Sit: 4: Min assist;HOB elevated;With rails Right Sidelying to Sit Details: Tactile cues for initiation;Verbal cues for sequencing;Tactile cues for sequencing Sitting - Scoot to Edge of Bed: 5: Supervision Transfers Transfers: Yes Sit to Stand: 3: Mod assist Sit to Stand Details: Tactile cues for initiation;Tactile cues for sequencing;Verbal cues for sequencing Stand to Sit: 3: Mod assist Stand to Sit Details (indicate cue type and reason): Tactile  cues for initiation;Tactile cues for sequencing;Verbal cues for sequencing Stand Pivot Transfers: 3: Mod assist Stand Pivot Transfer Details: Tactile cues for initiation;Tactile cues for sequencing;Verbal cues for sequencing Locomotion  Ambulation Ambulation: Yes Ambulation/Gait Assistance: 3: Mod assist Ambulation Distance (Feet): 3 Feet Assistive device: Rolling walker Ambulation/Gait Assistance Details: Tactile cues for initiation;Tactile cues for sequencing;Verbal cues for sequencing Ambulation/Gait Assistance Details: pt seems nervous/anxious about ambulating and needs cueing to initiate.  Feel pt could have ambulated further, however cognitive and expressive difficulties limiting participation.  pt saw W/C next to PT and began side stepping into PT towards W/C.  Once PT moved to the side, pt completed SPT to W/C.   Gait Gait Pattern: Step-through pattern;Trunk flexed Stairs / Additional Locomotion Stairs: No Wheelchair Mobility Wheelchair Mobility: No  Trunk/Postural Assessment  Cervical Assessment Cervical Assessment: Within Functional Limits Thoracic Assessment Thoracic Assessment: Within Functional Limits Lumbar Assessment Lumbar Assessment: Within Functional Limits Postural Control Postural Control: Within Functional Limits  Balance Balance Balance Assessed: Yes Dynamic Standing Balance Dynamic Standing - Balance Support: Left upper extremity supported;During functional activity Dynamic Standing - Level of Assistance: 5: Stand by assistance Extremity Assessment      RLE Assessment RLE Assessment: Within Functional Limits (Not formally assessed 2/2 cognition, but demos good mobility) LLE Assessment LLE Assessment: Exceptions to [facility] LLE Strength LLE Overall Strength Comments: pt with hx L BKA.  Unable to formally assess strength 2/2 cognition, however demos good functional mobility.    FIM:  FIM - Banker Devices:  Walker;Prosthesis;Bed rails Bed/Chair Transfer: 4: Supine > Sit: Min A (steadying Pt. > 75%/lift 1 leg);3: Bed > Chair or W/C: Mod A (lift or lower assist);3: Chair or W/C > Bed: Mod A (lift or lower assist) FIM - Locomotion: Wheelchair Locomotion: Wheelchair: 0: Activity did not occur FIM - Locomotion: Ambulation Locomotion: Ambulation Assistive Devices: Designer, industrial/product Ambulation/Gait Assistance: 3: Mod assist Locomotion: Ambulation: 1: Travels less than 50 ft with moderate assistance (Pt: 50 - 74%) FIM - Locomotion: Stairs Locomotion: Stairs: 0: Activity did not occur   Refer to Care Plan for Long Term Goals  Recommendations for other services: None  Discharge Criteria: Patient will  be discharged from PT if patient refuses treatment 3 consecutive times without medical reason, if treatment goals not met, if there is a change in medical status, if patient makes no progress towards goals or if patient is discharged from hospital.  The above assessment, treatment plan, treatment alternatives and goals were discussed and mutually agreed upon: by family  Negar Sieler F 01/11/2013, 9:05 AM

## 2013-01-11 NOTE — Progress Notes (Signed)
INITIAL NUTRITION ASSESSMENT  DOCUMENTATION CODES Per approved criteria  -Obesity Unspecified   INTERVENTION: 1.  Supplements; Ensure Complete po BID, each supplement provides 350 kcal and 13 grams of protein. 2.  General healthful diet; encourage intake as able.   NUTRITION DIAGNOSIS: Inadequate oral intake related to decreased appetite as evidenced by PO intake 25-50% of meals  Monitor:  PO intake, swallowing function, labs, weight trend.  Reason for Assessment: MST  57 y.o. female  Admitting Dx: SDH  ASSESSMENT: Pt admitted with confusion and found to have Bil SDH R greater than L with hx of L BKA.  Diet currently Dysphagia 3 with tolerance. PO 25-50% of meals. Pt reported abdominal pain this AM. Requesting to be placed on bed pan during visit.  RD to add supplements and will continue to follow.    Height: Ht Readings from Last 1 Encounters:  01/05/13 5\' 9"  (1.753 m)    Weight: Wt Readings from Last 1 Encounters:  01/10/13 202 lb 13.2 oz (92 kg)    Ideal Body Weight: 62kg  % Ideal Body Weight: 148%  Wt Readings from Last 10 Encounters:  01/10/13 202 lb 13.2 oz (92 kg)  01/09/13 209 lb 7 oz (95 kg)    Usual Body Weight: pt unable to state  BMI:  Body mass index is 29.94 kg/(m^2). class 1 obesity  Estimated Nutritional Needs: Kcal: 1700-1900 Protein: 95-115 gm Fluid: 1.7-1.9 L  Skin: intact  Diet Order: Dysphagia 3, thin liquids  EDUCATION NEEDS: -Education need addressed  No intake or output data in the 24 hours ending 01/11/13 1023  Last BM: 9/10  Labs:   Recent Labs Lab 01/07/13 0355 01/07/13 1459 01/11/13 0545  NA 133* 132* 127*  K 2.7* 3.6 3.3*  CL 90* 91* 85*  CO2 26 24 26   BUN 8 9 20   CREATININE 0.53 0.57 0.99  CALCIUM 9.4 9.6 10.1  GLUCOSE 99 97 118*    CBG (last 3)  No results found for this basename: GLUCAP,  in the last 72 hours  Scheduled Meds: . furosemide  40 mg Oral BID  . gabapentin  600 mg Oral BID  .  hydrochlorothiazide  25 mg Oral Daily  . levETIRAcetam  500 mg Oral BID  . levothyroxine  100 mcg Oral QAC breakfast  . pantoprazole  40 mg Oral QHS  . potassium chloride  20 mEq Oral BID  . senna-docusate  1 tablet Oral BID  . sulfamethoxazole-trimethoprim  1 tablet Oral Q12H    Continuous Infusions:    Past Medical History  Diagnosis Date  . GERD (gastroesophageal reflux disease)   . DVT (deep venous thrombosis)   . PE (pulmonary embolism)   . SLE inhibitor syndrome   . Aortic thromboembolism   . Hypothyroidism   . Hypertension   . Iron deficiency anemia     Past Surgical History  Procedure Laterality Date  . Below knee leg amputation  2003  . Cholecystectomy    . Abdominal hysterectomy      Loyce Dys, MS RD LDN Clinical Inpatient Dietitian Pager: [phone] Weekend/After hours pager: [phone]

## 2013-01-11 NOTE — Evaluation (Signed)
This note has been reviewed and this clinician agrees with information provided.  

## 2013-01-11 NOTE — Evaluation (Signed)
Occupational Therapy Assessment and Plan  Patient Details  Name: Frances Finley MRN: [id number] Date of Birth: 11/14/1955  OT Diagnosis: cognitive deficits, generalized weakness, visual impairements Rehab Potential: Rehab Potential: Good ELOS:  2 weeks.   Today's Date: 01/11/2013 Time: 0830-0930 Time Calculation (min): 60 min  Problem List:  Patient Active Problem List   Diagnosis Date Noted  . SDH (subdural hematoma) 01/11/2013  . HEMOCHROMATOSIS 07/31/2008  . ANXIETY 07/31/2008  . 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 SINUSITIS, ACUTE 07/31/2008  . GERD 07/31/2008  . OTHER&UNSPEC NONSPECIFIC IMMUNOLOGICAL FINDINGS 07/31/2008  . HEMOCHROMATOSIS 07/31/2008    Past Medical History:  Past Medical History  Diagnosis Date  . GERD (gastroesophageal reflux disease)   . DVT (deep venous thrombosis)   . PE (pulmonary embolism)   . SLE inhibitor syndrome   . Aortic thromboembolism   . Hypothyroidism   . Hypertension   . Iron deficiency anemia    Past Surgical History:  Past Surgical History  Procedure Laterality Date  . Below knee leg amputation  2003  . Cholecystectomy    . Abdominal hysterectomy      Assessment & Plan Clinical Impression:Frances Finley is a 57 y.o. 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 Admitted 01/05/2013 with progressive change in mental status denies any recent trauma. Cranial CT scan showed acute bilateral subdural hematomas, right greater than left. Followup neurosurgery Dr. Marikay Alar. INR on admission of 2.15 and was reversed. Advise conservative care of subdural hematoma with serial followup cranial CT scans that remained stable. Followup CT of the brain 01/09/2013 with no new hemorrhage and no change in the approximately 3 mm midline shift from left-to-right . Keppra was added for seizure prophylaxis. Patient transferred to CIR on 01/10/2013 .    Patient  currently requires min-mod A with basic self-care skills secondary to decreased initiation, decreased attention, decreased awareness, decreased problem solving and decreased safety awareness, apraxia, decreased activity tolerance.  Prior to hospitalization, patient could complete BADL's with modified independent .  Patient will benefit from skilled intervention to decrease level of assist with basic self-care skills prior to discharge home with care partner.  Anticipate patient will require 24 hour supervision and follow up home health.  OT - End of Session Endurance Deficit: Yes OT Assessment Rehab Potential: Good OT Patient demonstrates impairments in the following area(s): Balance;Cognition;Behavior;Endurance;Safety;Perception OT Basic ADL's Functional Problem(s): Eating;Grooming;Bathing;Dressing;Toileting OT Transfers Functional Problem(s): Toilet;Tub/Shower OT Plan OT Frequency: 5 out of 7 days OT Treatment/Interventions: Cognitive remediation/compensation;Balance/vestibular training;Community reintegration;Discharge planning;DME/adaptive equipment instruction;Functional mobility training;Psychosocial support;Therapeutic Activities;UE/LE Strength taining/ROM;Visual/perceptual remediation/compensation;Wheelchair propulsion/positioning;UE/LE Coordination activities;Therapeutic Exercise;Self Care/advanced ADL retraining;Patient/family education OT Self Feeding Anticipated Outcome(s): Supervision OT Basic Self-Care Anticipated Outcome(s): Supervision OT Toileting Anticipated Outcome(s): Supervision OT Bathroom Transfers Anticipated Outcome(s): Supervision OT Recommendation Patient destination: Home Follow Up Recommendations: Home health OT   Skilled Therapeutic Intervention Pt in w/c upon arrival to room, husband present and interactive with pt throughout OT evaluation and self-care retraining. Pt not able to follow one-step directions consistently. Pt completed toilet transfer from w/c with  grab bar with tactile cues, indicated she was having stomach pain and needed to have a bowel movement.  Pt was voided and had bm at that time, appeared fearful of movement/falls during transfers.  Pt sat in w/c to bathe/dress at sink.  Total assist with tactile cues to sequence bathing tasks, perseverated on some tasks and reluctant to engage in others due to fatigue as assessed  by body language.  Pt dressed with decreased assistance with provided an article of clothing in her hands.  Pt spoke frequently but unable to relay comprehensible needs/thoughts due to aphasia/decreased attention span. Utilized LUE for most tasks, less attention to the R.   Second Session:  Time: 1400-1430 Pt engaged in session focused on activity tolerance, awareness/attention, functional mobility/transfers.  Pt transferred from EOB to w/c and self-propelled w/c to day room with tactile cues to initiate task.  Able to carry out task with max verbal cues.  Pt able to identify correct object approximately 50% of the time when given a choice of two objects.  Demonstrated correct usage of object approximately 50% of time, the remaining 50% of the time, pt demonstrated ideational apraxia.  Ambulated with RW and encouragement, fearful and hesitant to walk initially. Complained of headache at conclusion of session.  Demonstrated slow R eye movements, R sided inattention, and impaired sustained attention.   OT Evaluation Precautions/Restrictions  Precautions Precautions: Fall Precaution Comments: pt with L Prosthetic leg Restrictions Weight Bearing Restrictions: No    Home Living/Prior Functioning Home Living Available Help at Discharge: Family;Available PRN/intermittently Type of Home: House Home Access: Stairs to enter [employer] of Steps: 3 Entrance Stairs-Rails: Right;Left;Can reach both Home Layout: One level  Lives With: Spouse Prior Function Level of Independence: Independent with basic ADLs;Independent  with homemaking with ambulation  Able to Take Stairs?: Yes   Vision/Perception  Vision - History Baseline Vision: No visual deficits Vision: Pt noted to have delayed tracking in R eye, slow movements in R eye, husband reports recent diplopia Praxis Praxis: Impaired Praxis Impairment Details: Perseveration,  ideational apraxia.  Pt has R-sided inattention.  Cognition Overall Cognitive Status: Impaired/Different from baseline Arousal/Alertness: Awake/alert Orientation Level: Other (comment) (difficult to assess due to language/cognition.  ) Attention: Focused Focused Attention: Impaired Focused Attention Impairment: Verbal basic;Functional basic Awareness: Impaired Awareness Impairment: Intellectual impairment Problem Solving: Impaired Problem Solving Impairment: Verbal basic;Functional basic Behaviors: Perseveration;Restless Safety/Judgment: Impaired Memory: Unable to assess.  Sensation Sensation Additional Comments: difficult to assess due to cognition/language Coordination Gross Motor Movements are Fluid and Coordinated: Yes Fine Motor Movements are Fluid and Coordinated: Yes Motor  Motor Motor: Within Functional Limits Mobility  Bed Mobility Right Sidelying to Sit: 4: Min assist;HOB elevated;With rails Right Sidelying to Sit Details: Tactile cues for initiation;Verbal cues for sequencing;Tactile cues for sequencing Sitting - Scoot to Edge of Bed: 5: Supervision Transfers Transfers: Sit to Stand Sit to Stand: 3: Mod assist Sit to Stand Details: Tactile cues for initiation;Tactile cues for sequencing;Tactile cues for placement Stand to Sit: 4: Min assist Stand to Sit Details (indicate cue type and reason): Verbal cues for sequencing  Trunk/Postural Assessment  Cervical Assessment Cervical Assessment: Within Functional Limits Thoracic Assessment Thoracic Assessment: Within Functional Limits Lumbar Assessment Lumbar Assessment: Within Functional Limits Postural  Control Postural Control: Within Functional Limits  Balance Balance Balance Assessed: Yes Static Sitting Balance Static Sitting - Balance Support: Bilateral upper extremity supported;Feet supported Static Sitting - Level of Assistance: 5: Stand by assistance Static Standing Balance Static Standing - Level of Assistance: 5: Stand by assistance Dynamic Standing Balance Dynamic Standing - Balance Support: During functional activity Dynamic Standing - Level of Assistance: 4: Min assist Extremity/Trunk Assessment RUE Assessment RUE Assessment: Within Functional Limits, pt favors utilizing LUE for functional tasks. LUE Assessment LUE Assessment: Within Functional Limits  FIM:  FIM - Grooming Grooming Steps: Wash, rinse, dry face;Wash, rinse, dry hands;Oral care, brush teeth, clean dentures Grooming:  4: Patient completes 3 of 4 or 4 of 5 steps FIM - Bathing Bathing Steps Patient Completed: Chest;Right Arm;Left Arm;Buttocks;Front perineal area;Abdomen;Right upper leg;Left upper leg;Right lower leg (including foot);Left lower leg (including foot) Bathing: 4: Min-Patient completes 8-9 71f 10 parts or 75+ percent FIM - Upper Body Dressing/Undressing Upper body dressing/undressing steps patient completed: Thread/unthread right bra strap;Thread/unthread left bra strap;Thread/unthread right sleeve of pullover shirt/dresss;Thread/unthread left sleeve of pullover shirt/dress;Put head through opening of pull over shirt/dress;Pull shirt over trunk Upper body dressing/undressing: 4: Min-Patient completed 75 plus % of tasks FIM - Lower Body Dressing/Undressing Lower body dressing/undressing steps patient completed: Thread/unthread left pants leg;Thread/unthread right underwear leg;Thread/unthread left underwear leg;Pull underwear up/down;Thread/unthread right pants leg;Pull pants up/down Lower body dressing/undressing: 3: Mod-Patient completed 50-74% of tasks FIM - Toileting Toileting: 1: Total-Patient  completed zero steps, helper did all 3 FIM - Diplomatic Services operational officer Devices: Grab bars Toilet Transfers: 3-To toilet/BSC: Mod A (lift or lower assist);3-From toilet/BSC: Mod A (lift or lower assist)   Refer to Care Plan for Long Term Goals  Recommendations for other services: None  Discharge Criteria: Patient will be discharged from OT if patient refuses treatment 3 consecutive times without medical reason, if treatment goals not met, if there is a change in medical status, if patient makes no progress towards goals or if patient is discharged from hospital.  The above assessment, treatment plan, treatment alternatives and goals were discussed and mutually agreed upon: by patient and by family  Kandis Ban 01/11/2013, 12:36 PM

## 2013-01-11 NOTE — Progress Notes (Signed)
Social Work Assessment and Plan Social Work Assessment and Plan  Patient Details  Name: Frances Finley MRN: [id number] Date of Birth: [birth year]  Today's Date: 01/11/2013  Problem List:  Patient Active Problem List   Diagnosis Date Noted  . SDH (subdural hematoma) 01/11/2013  . HEMOCHROMATOSIS 07/31/2008  . ANXIETY 07/31/2008  . 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 SINUSITIS, ACUTE 07/31/2008  . GERD 07/31/2008  . OTHER&UNSPEC NONSPECIFIC IMMUNOLOGICAL FINDINGS 07/31/2008  . HEMOCHROMATOSIS 07/31/2008   Past Medical History:  Past Medical History  Diagnosis Date  . GERD (gastroesophageal reflux disease)   . DVT (deep venous thrombosis)   . PE (pulmonary embolism)   . SLE inhibitor syndrome   . Aortic thromboembolism   . Hypothyroidism   . Hypertension   . Iron deficiency anemia    Past Surgical History:  Past Surgical History  Procedure Laterality Date  . Below knee leg amputation  2003  . Cholecystectomy    . Abdominal hysterectomy     Social History:  reports that she has quit smoking. She does not have any smokeless tobacco history on file. She reports that  drinks alcohol. She reports that she does not use illicit drugs.  Family / Support Systems Marital Status: Married How Long?: 22 years Patient Roles: Spouse;Parent Spouse/Significant Other: Wyatt  [phone]  [phone] Children: Two children both involved Anticipated Caregiver: Children and husband Ability/Limitations of Caregiver: Husband works during the day but reports; " Someone will always be there with her." Caregiver Availability: 24/7 Family Dynamics: Close knit family who are supportive and involved and willing to assist one another.  Husband is here and observing pt in therapies.    Social History Preferred language: English Religion:  Cultural Background: No issues Education: High School Read: Yes Write: Yes Employment Status: Disabled Nurse, adult Issues: No issues Guardian/Conservator: NOne-according to MD pt is not capable of making her own decisions.  Will rely upon her husband to make any decisions while she is here.   Abuse/Neglect Physical Abuse: Denies Verbal Abuse: Denies Sexual Abuse: Denies Exploitation of patient/patient's resources: Denies Self-Neglect: Denies  Emotional Status Pt's affect, behavior adn adjustment status: Pt is motivated, she talk as if we all know what she is saying, not realizing it is jargon.  Her husband is very supportive and tries to answer her and stay with what she is saying.  He reports: " She has always been independent and done well." Recent Psychosocial Issues: Other medical issues-did well after BKA in 2003. Pyschiatric History: History of anxiety-takes valium for this.  Husband says it helps her. " She sometimes has trouble sleeping and bad nerves."  Will assess pt when able to converse. Substance Abuse History: No issues  Patient / Family Perceptions, Expectations & Goals Pt/Family understanding of illness & functional limitations: Husband can explain his wife's condition and has spoken with medical doctor and feels he has a basic understanding of her condition.  He is hopeful she will do well here. Premorbid pt/family roles/activities: Wife, mother, Home owner, Church member. etc Anticipated changes in roles/activities/participation: resume upon discharge Pt/family expectations/goals: Pt states: ' What is up there and where is that clock it keeps moving."  Husband states: " I try to keep up and make sense of what she is saying but I have no idea."  [employer]: None Premorbid Home Care/DME Agencies: Other (Comment) (In the past) Transportation available at discharge: Husband and family Resource referrals recommended: Support group (specify) (BI Support group &  Amputee Support group)  Discharge Planning Living Arrangements: Spouse/significant  other;Children Support Systems: Spouse/significant other;Children;Other relatives;Friends/neighbors Type of Residence: Private residence Insurance Resources: Media planner (specify) Passenger transport manager) Financial Resources: SSD;Family Support Financial Screen Referred: No Living Expenses: Lives with family Money Management: Spouse Does the patient have any problems obtaining your medications?: No Home Management: Family members Patient/Family Preliminary Plans: Return home with husband and family assisting her.  Husband aware she will require 24 hour care and has arranged for this with other family members.  He is here to observe her in therapies. Social Work Anticipated Follow Up Needs: HH/OP;Support Group  Clinical Impression Pleasant female who is talking away but unsure what she is saying.  Supportive husband at bedside who is trying to interpret or make any sense of her statements. He quietly listens and answers her.  Aware she will require 24 hour care at discharge and is arranging this.  Await team's evaluations.  Lucy Chris 01/11/2013, 3:26 PM

## 2013-01-11 NOTE — Progress Notes (Signed)
Frances Ina, Pa made aware of patient's heart rate ranging in 100-115 range today. No new orders given at this time. Roberts-VonCannon, Aidin Doane Elon Jester

## 2013-01-11 NOTE — Progress Notes (Signed)
Subjective/Complaints: No issues per husband except for some belly pain? Pt is wide awake this am A 12 point review of systems has been performed and if not noted above is otherwise negative.   Objective: Vital Signs: Blood pressure 122/84, pulse 101, temperature 98.4 F (36.9 C), temperature source Oral, resp. rate 18, weight 92 kg (202 lb 13.2 oz), SpO2 98.00%. No results found.  Recent Labs  01/11/13 0545  WBC 12.8*  HGB 18.6*  HCT 51.0*  PLT 448*    Recent Labs  01/11/13 0545  NA 127*  K 3.3*  CL 85*  GLUCOSE 118*  BUN 20  CREATININE 0.99  CALCIUM 10.1   CBG (last 3)  No results found for this basename: GLUCAP,  in the last 72 hours  Wt Readings from Last 3 Encounters:  01/10/13 92 kg (202 lb 13.2 oz)  01/09/13 95 kg (209 lb 7 oz)    Physical Exam:  Gen: pt is difficult to arouse  HENT:  Head: Normocephalic.  Eyes: EOM are normal.  No nystagmus  Neck: Normal range of motion. Neck supple. No thyromegaly present.  Cardiovascular: Normal rate and regular rhythm. No murmurs  Pulmonary/Chest: Effort normal and breath sounds normal. No respiratory distress. No wheezes  Abdominal: Soft. Bowel sounds are normal. She exhibits no distension.  Musculoskeletal:  Left BKA. Limb well-formed, non-tender Neurological:  Moved all 4's without focal weakness.  Sensed pain in all 4's. Could answer simple questions. Said "thank you" as I was leaving but otherwise "word salad"---could not respond or comprehend any of my language today. No focal CN deficits Skin: Skin is warm and dry.  Psychiatric:  alert   Assessment/Plan: 1. Functional deficits secondary to bilateral [facility] which require 3+ hours per day of interdisciplinary therapy in a comprehensive inpatient rehab setting. Physiatrist is providing close team supervision and 24 hour management of active medical problems listed below. Physiatrist and rehab team continue to assess barriers to discharge/monitor patient  progress toward functional and medical goals. FIM:                   Comprehension Comprehension: 1-Understands basic less than 25% of the time/requires cueing 75% of the time  Expression Expression Mode: Verbal Expression: 1-Expresses basis less than 25% of the time/requires cueing greater than 75% of the time.  Social Interaction Social Interaction: 2-Interacts appropriately 25 - 49% of time - Needs frequent redirection.  Problem Solving Problem Solving: 1-Solves basic less than 25% of the time - needs direction nearly all the time or does not effectively solve problems and may need a restraint for safety  Memory Memory: 1-Recognizes or recalls less than 25% of the time/requires cueing greater than 75% of the time  Medical Problem List and Plan:  1. Bilateral subdural hematomas after recent/remote fall  2. DVT Prophylaxis/Anticoagulation: SCDs. Monitor for signs of DVT. History of DVT and pulmonary emboli. Coumadin discontinued secondary to subdural hematoma  3. Pain Management: Neurontin 600 mg twice a day, Tylenol as needed. Monitor with increased mobility. Patient was taking OxyContin sustained-release 10 mg 3 times a day prior to admission. Plan to resume as/when tolerated and needed.  4. Mood/anxiety. Valium 5 mg every 6 hours as needed--limit as possible given neuro-cognitive deficits  5. Neuropsych: This patient is not capable of making decisions on her own behalf.  6. Seizure prophylaxis. Keppra 500 mg twice a day. Monitor for any signs of seizure  7. 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  Hypothyroidism. Synthroid  9. Hypertension. Lasix 40 mg twice a day, hydrochlorothiazide 25 mg daily. Controlled at present 10. Hypokalemia. Continue supplement and followup labs--continue to replace 11. Leukocytosis--may be reactive. Recent urine cx negative. Recheck friday  LOS (Days) 1 A FACE TO FACE EVALUATION WAS PERFORMED  SWARTZ,ZACHARY T 01/11/2013  8:12 AM

## 2013-01-11 NOTE — Progress Notes (Signed)
Patient information reviewed and entered into eRehab system by Judia Arnott, RN, CRRN, PPS Coordinator.  Information including medical coding and functional independence measure will be reviewed and updated through discharge.     Per nursing patient was given "Data Collection Information Summary for Patients in Inpatient Rehabilitation Facilities with attached "Privacy Act Statement-Health Care Records" upon admission.  

## 2013-01-11 NOTE — Care Management Note (Signed)
Inpatient Rehabilitation Center Individual Statement of Services  Patient Name:  Frances Finley  Date:  01/11/2013  Welcome to the Inpatient Rehabilitation Center.  Our goal is to provide you with an individualized program based on your diagnosis and situation, designed to meet your specific needs.  With this comprehensive rehabilitation program, you will be expected to participate in at least 3 hours of rehabilitation therapies Monday-Friday, with modified therapy programming on the weekends.  Your rehabilitation program will include the following services:  Physical Therapy (PT), Occupational Therapy (OT), Speech Therapy (ST), 24 hour per day rehabilitation nursing, Neuropsychology, Case Management (Social Worker), Rehabilitation Medicine, Nutrition Services and Pharmacy Services  Weekly team conferences will be held on Tuesday to discuss your progress.  Your Social Worker will talk with you frequently to get your input and to update you on team discussions.  Team conferences with you and your family in attendance may also be held.  Expected length of stay: 2 weeks Overall anticipated outcome: Supervision with cues  Depending on your progress and recovery, your program may change. Your Social Worker will coordinate services and will keep you informed of any changes. Your Social Worker's name and contact numbers are listed  below.  The following services may also be recommended but are not provided by the Inpatient Rehabilitation Center:   Driving Evaluations  Home Health Rehabiltiation Services  Outpatient Rehabilitation Services    Arrangements will be made to provide these services after discharge if needed.  Arrangements include referral to agencies that provide these services.  Your insurance has been verified to be:  [employer] Your primary doctor is:  Dr Lesly Rubenstein  Pertinent information will be shared with your doctor and your insurance company.  Social Worker:  Dossie Der, [phone] or [phone]  Information discussed with and copy given to patient by: Lucy Chris, 01/11/2013, 2:04 PM

## 2013-01-11 NOTE — Evaluation (Signed)
Speech Language Pathology Cognitive-linguistic evaluation   Patient Details  Name: Frances Finley MRN: [id number] Date of Birth: 09-Nov-1955  SLP Diagnosis: Aphasia;Cognitive Impairments  Rehab Potential: Excellent ELOS: 2 weeks   Today's Date: 01/11/2013 Time: 1015-1110 Time Calculation (min): 55 min  Skilled Therapeutic Intervention: Administered cognitive-linguistic evaluation. Please see below for details. Educated pt and husband in regards to pt's current cognitive-linguistic function and goals of skilled SLP intervention. Pt's husband verbalized understanding.   Problem List:  Patient Active Problem List   Diagnosis Date Noted  . SDH (subdural hematoma) 01/11/2013  . HEMOCHROMATOSIS 07/31/2008  . ANXIETY 07/31/2008  . 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 SINUSITIS, ACUTE 07/31/2008  . GERD 07/31/2008  . OTHER&UNSPEC NONSPECIFIC IMMUNOLOGICAL FINDINGS 07/31/2008  . HEMOCHROMATOSIS 07/31/2008   Past Medical History:  Past Medical History  Diagnosis Date  . GERD (gastroesophageal reflux disease)   . DVT (deep venous thrombosis)   . PE (pulmonary embolism)   . SLE inhibitor syndrome   . Aortic thromboembolism   . Hypothyroidism   . Hypertension   . Iron deficiency anemia    Past Surgical History:  Past Surgical History  Procedure Laterality Date  . Below knee leg amputation  2003  . Cholecystectomy    . Abdominal hysterectomy      Assessment / Plan / Recommendation Clinical Impression  57 y.o. 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 Admitted 01/05/2013 with progressive change in mental status denies any recent trauma. Cranial CT scan showed acute bilateral subdural hematomas, right greater than left. Follow-up neurosurgery Dr. Marikay Alar. INR on admission of 2.15 and was reversed. Advise conservative care of subdural hematoma with serial followup cranial CT scans that  remained stable. Followup CT of the brain 01/09/2013 with no new hemorrhage and no change in the approximately 3 mm midline shift from left-to-right . Keppra was added for seizure prophylaxis. Physical occupational therapy evaluations completed an ongoing. Recommendations have been made for physical medicine rehabilitation consult to consider inpatient rehabilitation services. Patient was felt to be a good candidate for inpatient rehabilitation services and was admitted for comprehensive rehabilitation program 01/10/13.  Pt presents with a severe receptive aphasia that appears to be consistent with Wernicke's aphasia. Pt required total A to follow 1 step commands without context and Max A multimodal cues to answer basic yes/no and biographical questions accurately.  Pt also required total A for written expression of name and reading comprehension at the word level. Majority of pt's verbal expression was perseverative, filled with empty speech and unrelated to topic. Pt required total A multimodal cueing to self-monitor. Pt's overall linguistic ability is impacted by pt's decreased sustained attention, awareness, and initiation. BSE was not administered today due to pt declining trials despite multiple attempts. Pt would benefit from skilled SLP intervention to maximize cognitive-linguistic function and overall functional independence.     SLP Assessment  Patient will need skilled Speech Lanaguage Pathology Services during CIR admission    Recommendations  Oral Care Recommendations: Oral care BID Patient destination: Home Follow up Recommendations: 24 hour supervision/assistance;Outpatient SLP Equipment Recommended: None recommended by SLP    SLP Frequency 5 out of 7 days   SLP Treatment/Interventions Cueing hierarchy;Cognitive remediation/compensation;Environmental controls;Internal/external aids;Speech/Language facilitation;Patient/family education;Therapeutic Activities;Functional tasks;Multimodal  communication approach    Pain Unable to verbalize but grabbing stomach and grimacing. RN made aware   Short Term Goals: Week 1: SLP Short Term Goal 1 (Week 1): Pt will sustain  attention to a functional task for 60 seconds with Max A multimodal cues for redirection SLP Short Term Goal 2 (Week 1): Pt will answer yes/no questions in regards to biographical information with 50% accuracy with Mod A multimodal cues.  SLP Short Term Goal 3 (Week 1): Pt will follow 1 step commands with Max A multimodal cueing  SLP Short Term Goal 4 (Week 1): Pt will identify familiar items with Max A multimodal cueing  SLP Short Term Goal 5 (Week 1): Pt will self-monitor to decrease length of utternace during verbal responses with Max A multimodal cueing  See FIM for current functional status Refer to Care Plan for Long Term Goals  Recommendations for other services: None  Discharge Criteria: Patient will be discharged from SLP if patient refuses treatment 3 consecutive times without medical reason, if treatment goals not met, if there is a change in medical status, if patient makes no progress towards goals or if patient is discharged from hospital.  The above assessment, treatment plan, treatment alternatives and goals were discussed and mutually agreed upon: by family  Dai Apel 01/11/2013, 1:37 PM

## 2013-01-12 ENCOUNTER — Inpatient Hospital Stay (HOSPITAL_COMMUNITY): Payer: Medicare Other | Admitting: Physical Therapy

## 2013-01-12 ENCOUNTER — Inpatient Hospital Stay (HOSPITAL_COMMUNITY): Payer: Medicare Other | Admitting: Speech Pathology

## 2013-01-12 ENCOUNTER — Inpatient Hospital Stay (HOSPITAL_COMMUNITY): Payer: Medicare Other | Admitting: Occupational Therapy

## 2013-01-12 ENCOUNTER — Encounter (HOSPITAL_COMMUNITY): Payer: Medicare Other | Admitting: Occupational Therapy

## 2013-01-12 NOTE — Progress Notes (Signed)
Subjective/Complaints: Awake and alert today. No complaints A 12 point review of systems has been performed and if not noted above is otherwise negative.   Objective: Vital Signs: Blood pressure 119/83, pulse 93, temperature 97.5 F (36.4 C), temperature source Oral, resp. rate 18, weight 94.802 kg (209 lb), SpO2 97.00%. No results found.  Recent Labs  01/11/13 0545  WBC 12.8*  HGB 18.6*  HCT 51.0*  PLT 448*    Recent Labs  01/11/13 0545  NA 127*  K 3.3*  CL 85*  GLUCOSE 118*  BUN 20  CREATININE 0.99  CALCIUM 10.1   CBG (last 3)  No results found for this basename: GLUCAP,  in the last 72 hours  Wt Readings from Last 3 Encounters:  01/11/13 94.802 kg (209 lb)  01/09/13 95 kg (209 lb 7 oz)    Physical Exam:  Gen: pt is difficult to arouse  HENT:  Head: Normocephalic.  Eyes: EOM are normal.  No nystagmus  Neck: Normal range of motion. Neck supple. No thyromegaly present.  Cardiovascular: Normal rate and regular rhythm. No murmurs  Pulmonary/Chest: Effort normal and breath sounds normal. No respiratory distress. No wheezes  Abdominal: Soft. Bowel sounds are normal. She exhibits no distension.  Musculoskeletal:  Left BKA. Limb well-formed, non-tender Neurological:  Moved all 4's without focal weakness.  Sensed pain in all 4's. Could answer simple questions. Said "thank you" as I was leaving but otherwise "word salad"---could not respond or comprehend any of my language today. Better with spontaneous phrases. No focal CN deficits Skin: Skin is warm and dry.  Psychiatric:  alert   Assessment/Plan: 1. Functional deficits secondary to bilateral [facility] which require 3+ hours per day of interdisciplinary therapy in a comprehensive inpatient rehab setting. Physiatrist is providing close team supervision and 24 hour management of active medical problems listed below. Physiatrist and rehab team continue to assess barriers to discharge/monitor patient progress toward  functional and medical goals. FIM: FIM - Bathing Bathing Steps Patient Completed: Chest;Right Arm;Left Arm;Buttocks;Front perineal area;Abdomen;Right upper leg;Left upper leg;Right lower leg (including foot);Left lower leg (including foot) Bathing: 4: Min-Patient completes 8-9 54f 10 parts or 75+ percent  FIM - Upper Body Dressing/Undressing Upper body dressing/undressing steps patient completed: Thread/unthread right bra strap;Thread/unthread left bra strap;Thread/unthread right sleeve of pullover shirt/dresss;Thread/unthread left sleeve of pullover shirt/dress;Put head through opening of pull over shirt/dress;Pull shirt over trunk Upper body dressing/undressing: 4: Min-Patient completed 75 plus % of tasks FIM - Lower Body Dressing/Undressing Lower body dressing/undressing steps patient completed: Thread/unthread left pants leg;Thread/unthread right underwear leg;Thread/unthread left underwear leg;Pull underwear up/down;Thread/unthread right pants leg;Pull pants up/down Lower body dressing/undressing: 3: Mod-Patient completed 50-74% of tasks  FIM - Toileting Toileting: 1: Total-Patient completed zero steps, helper did all 3  FIM - Diplomatic Services operational officer Devices: Grab bars Toilet Transfers: 3-To toilet/BSC: Mod A (lift or lower assist);3-From toilet/BSC: Mod A (lift or lower assist)  FIM - Press photographer Assistive Devices: Walker;Prosthesis;Bed rails Bed/Chair Transfer: 4: Supine > Sit: Min A (steadying Pt. > 75%/lift 1 leg);3: Bed > Chair or W/C: Mod A (lift or lower assist);3: Chair or W/C > Bed: Mod A (lift or lower assist)  FIM - Locomotion: Wheelchair Locomotion: Wheelchair: 0: Activity did not occur FIM - Locomotion: Ambulation Locomotion: Ambulation Assistive Devices: Designer, industrial/product Ambulation/Gait Assistance: 3: Mod assist Locomotion: Ambulation: 1: Travels less than 50 ft with moderate assistance (Pt: 50 -  74%)  Comprehension Comprehension Mode: Auditory Comprehension: 1-Understands basic less than  25% of the time/requires cueing 75% of the time  Expression Expression Mode: Verbal Expression: 1-Expresses basis less than 25% of the time/requires cueing greater than 75% of the time.  Social Interaction Social Interaction: 2-Interacts appropriately 25 - 49% of time - Needs frequent redirection.  Problem Solving Problem Solving: 1-Solves basic less than 25% of the time - needs direction nearly all the time or does not effectively solve problems and may need a restraint for safety  Memory Memory: 1-Recognizes or recalls less than 25% of the time/requires cueing greater than 75% of the time  Medical Problem List and Plan:  1. Bilateral subdural hematomas after recent/remote fall  2. DVT Prophylaxis/Anticoagulation: SCDs. Monitor for signs of DVT. History of DVT and pulmonary emboli. Coumadin discontinued secondary to subdural hematoma  3. Pain Management: Neurontin 600 mg twice a day, Tylenol as needed. Monitor with increased mobility. Patient was taking OxyContin sustained-release 10 mg 3 times a day prior to admission. Plan to resume as/when tolerated and needed. --controlled it appears at present 4. Mood/anxiety. Valium 5 mg every 6 hours as needed--limit as possible given neuro-cognitive deficits  5. Neuropsych: This patient is not capable of making decisions on her own behalf.  6. Seizure prophylaxis. Keppra 500 mg twice a day. Monitor for any signs of seizure  7. 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 Hypothyroidism. Synthroid  9. Hypertension. Lasix 40 mg twice a day, hydrochlorothiazide 25 mg daily. Controlled at present 10. Hypokalemia. Continue supplement and followup labs--continue to replace 11. Leukocytosis--may be reactive. Recent urine cx negative. Still with 12.8k wbc's 12. Hemochromatosis--may be behind her increased hemoglobin---has been elevated it appears  chronically.  LOS (Days) 2 A FACE TO FACE EVALUATION WAS PERFORMED  SWARTZ,ZACHARY T 01/12/2013 8:03 AM

## 2013-01-12 NOTE — Evaluation (Signed)
Speech Language Pathology Bedside Swallow Evaluation   Patient Details  Name: Frances Finley MRN: [id number] Date of Birth: 11-28-1955  SLP Diagnosis: Dysphagia;Cognitive Impairments;Aphasia  Rehab Potential: Excellent ELOS: 2 weeks   Today's Date: 01/12/2013 Time: 9562-1308 Time Calculation (min): 15 min  Skilled Therapeutic Intervention: Administered BSE. Please see below for details.   Problem List:  Patient Active Problem List   Diagnosis Date Noted  . SDH (subdural hematoma) 01/11/2013  . HEMOCHROMATOSIS 07/31/2008  . ANXIETY 07/31/2008  . 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 SINUSITIS, ACUTE 07/31/2008  . GERD 07/31/2008  . OTHER&UNSPEC NONSPECIFIC IMMUNOLOGICAL FINDINGS 07/31/2008  . HEMOCHROMATOSIS 07/31/2008   Past Medical History:  Past Medical History  Diagnosis Date  . GERD (gastroesophageal reflux disease)   . DVT (deep venous thrombosis)   . PE (pulmonary embolism)   . SLE inhibitor syndrome   . Aortic thromboembolism   . Hypothyroidism   . Hypertension   . Iron deficiency anemia    Past Surgical History:  Past Surgical History  Procedure Laterality Date  . Below knee leg amputation  2003  . Cholecystectomy    . Abdominal hysterectomy      Assessment / Plan / Recommendation Clinical Impression  Pt administered BSE. Pt consumed thin liquids via cup and straw without overt s/s of aspiration. Pt also consumed trials of solid textures without overt s/s of aspiration but demonstrated prolonged mastication due to distractibility. Recommend pt continue with current diet of Dys. 3 textures with thin liquids and full supervision for utilization of swallowing compensatory strategies.     SLP Assessment  Patient will need skilled Speech Lanaguage Pathology Services during CIR admission    Recommendations  Diet Recommendations: Dysphagia 3 (Mechanical Soft);Thin liquid Liquid Administration via: Cup;Straw Medication Administration:  Whole meds with liquid Supervision: Patient able to self feed;Full supervision/cueing for compensatory strategies Compensations: Slow rate;Small sips/bites Postural Changes and/or Swallow Maneuvers: Seated upright 90 degrees Oral Care Recommendations: Oral care BID Patient destination: Home Follow up Recommendations: 24 hour supervision/assistance;Outpatient SLP Equipment Recommended: None recommended by SLP    SLP Frequency 5 out of 7 days   SLP Treatment/Interventions Cueing hierarchy;Cognitive remediation/compensation;Environmental controls;Internal/external aids;Speech/Language facilitation;Patient/family education;Therapeutic Activities;Functional tasks;Multimodal communication approach;Dysphagia/aspiration precaution training    Pain No/Denies Pain  Short Term Goals: Week 1: SLP Short Term Goal 1 (Week 1): Pt will sustain attention to a functional task for 60 seconds with Max A multimodal cues for redirection SLP Short Term Goal 2 (Week 1): Pt will answer yes/no questions in regards to biographical information with 50% accuracy with Mod A multimodal cues.  SLP Short Term Goal 3 (Week 1): Pt will follow 1 step commands with Max A multimodal cueing  SLP Short Term Goal 4 (Week 1): Pt will identify familiar items with Max A multimodal cueing  SLP Short Term Goal 5 (Week 1): Pt will self-monitor to decrease length of utternace during verbal responses with Max A multimodal cueing SLP Short Term Goal 6 (Week 1): Pt will demonstrate efficient mastication of regular textures with supervision verbal cues without overt s/s of aspiration.   See FIM for current functional status Refer to Care Plan for Long Term Goals  Recommendations for other services: None  Discharge Criteria: Patient will be discharged from SLP if patient refuses treatment 3 consecutive times without medical reason, if treatment goals not met, if there is a change in medical status, if patient makes no progress towards goals  or if patient is discharged from hospital.  The above assessment,  treatment plan, treatment alternatives and goals were discussed and mutually agreed upon: by patient and by family  Jalysa Swopes 01/12/2013, 4:22 PM

## 2013-01-12 NOTE — Progress Notes (Signed)
Speech Language Pathology Daily Session Note  Patient Details  Name: Frances Finley MRN: [id number] Date of Birth: February 26, 1956  Today's Date: 01/12/2013 Time: 1135-1220 Time Calculation (min): 45 min  Short Term Goals: Week 1: SLP Short Term Goal 1 (Week 1): Pt will sustain attention to a functional task for 60 seconds with Max A multimodal cues for redirection SLP Short Term Goal 2 (Week 1): Pt will answer yes/no questions in regards to biographical information with 50% accuracy with Mod A multimodal cues.  SLP Short Term Goal 3 (Week 1): Pt will follow 1 step commands with Max A multimodal cueing  SLP Short Term Goal 4 (Week 1): Pt will identify familiar items with Max A multimodal cueing  SLP Short Term Goal 5 (Week 1): Pt will self-monitor to decrease length of utternace during verbal responses with Max A multimodal cueing SLP Short Term Goal 6 (Week 1): Pt will demonstrate efficient mastication of regular textures with supervision verbal cues without overt s/s of aspiration.   Skilled Therapeutic Interventions: Treatment focus on dysphagia and cognitive-linguistic goals. SLP facilitated session by providing Max verbal and visual cues for initiation of lunch meal. Pt required hand over hand assist for utilization of utensils appropriately during self-feeding task, however, once self-feeding was initiated but sustained attention to task for ~5 minutes with Min A verbal cues for redirection. Pt consumed Dys. 3 textures with thin liquids without overt s/s of aspiration. Pt followed 1 step commands with Mod verbal and contextual cues with increased "islands" of spontaneous/appropriate language that increased during informal conversation with intermittent phonemic cues. Pt demonstrated decreased length of utterance but required Max-Total multimodal cues to self-monitor and correct errors throughout the session. Family education ongoing.    FIM:  Comprehension Comprehension Mode:  Auditory Comprehension: 3-Understands basic 50 - 74% of the time/requires cueing 25 - 50%  of the time Expression Expression Mode: Verbal Expression: 2-Expresses basic 25 - 49% of the time/requires cueing 50 - 75% of the time. Uses single words/gestures. Social Interaction Social Interaction: 2-Interacts appropriately 25 - 49% of time - Needs frequent redirection. Problem Solving Problem Solving: 1-Solves basic less than 25% of the time - needs direction nearly all the time or does not effectively solve problems and may need a restraint for safety Memory Memory: 1-Recognizes or recalls less than 25% of the time/requires cueing greater than 75% of the time FIM - Eating Eating Activity: 5: Set-up assist for open containers;5: Set-up assist for cut food  Pain Pain Assessment Pain Assessment: No/denies pain  Therapy/Group: Individual Therapy  Perry Molla 01/12/2013, 4:33 PM

## 2013-01-12 NOTE — Progress Notes (Signed)
Physical Therapy Session Note  Patient Details  Name: Frances Finley MRN: [id number] Date of Birth: [birth year]  Today's Date: 01/12/2013 Time: 1010-1105 Time Calculation (min): 55 min  Short Term Goals: Week 1:  PT Short Term Goal 1 (Week 1): pt will perform bed mobility with S without use of rails.   PT Short Term Goal 2 (Week 1): pt will demo transfers MinA.   PT Short Term Goal 3 (Week 1): pt will amb with RW and MinA 100' in controlled environment.   PT Short Term Goal 4 (Week 1): pt will don/doff prosthesis with only set-up A and verbal cues to initiate task.    Skilled Therapeutic Interventions/Progress Updates:    Pt able to ascend/descend steps with min/mod assist and repeated verbal/tactile cues and use of bil. Railings. Pt does not require cues for LE sequencing. Ambulation x 45', 66' with RW and up to mod assist for Rt./Lt. Knee buckling that happens without warning. Pt needs chair close to follow d/t when distracted (ie today she saw a chair along hallway and wanted to sit down) she makes unsafe movements while largely unbalanced and is unable to utilize RW appropriately, requires assist to sit prior to fall. Pt does not follow verbal cues consistently making mobility difficult and placing pt at a high risk for falls.     Pt alert but disoriented to situation, place, time only oriented to self. Gross word confusion throughout, pt unaware. PT addressed attention, short term/functional memory, basic pathfinding. Pt currently max to total assist for cognitive tasks.   Husband present during session, supportive.  Therapy Documentation Precautions:  Precautions Precautions: Fall Precaution Comments: pt with L Prosthetic leg Restrictions Weight Bearing Restrictions: No Pain: Pain Assessment Pain Assessment: PAINAD PAINAD (Pain Assessment in Advanced Dementia) Breathing: normal (RN made aware) Negative Vocalization: occasional moan/groan, low speech, negative/disapproving  quality Facial Expression: sad, frightened, frown Body Language: tense, distressed pacing, fidgeting Consolability: distracted or reassured by voice/touch PAINAD Score: 4  See FIM for current functional status  Therapy/Group: Individual Therapy  Wilhemina Bonito 01/12/2013, 12:11 PM

## 2013-01-12 NOTE — Progress Notes (Signed)
Occupational Therapy Session Note  Patient Details  Name: Frances Finley MRN: [id number] Date of Birth: 25-May-1955  Today's Date: 01/12/2013   Short Term Goals: Week 1:  OT Short Term Goal 1 (Week 1): Pt will bathe with mod verbal/tactile cues and min A.  OT Short Term Goal 2 (Week 1): Pt will complete toilet transfer with min A.  OT Short Term Goal 3 (Week 1): Pt will demonstrate sustaing attention for 1 grooming task with mod verbal cues. OT Short Term Goal 4 (Week 1): Pt will complete tub/shower transfer with min A, min verbal and tactile cues  Skilled Therapeutic Interventions/Progress Updates:   First Session:  Time:  0830- 0930 Time Calculation: 60 minutes.  Pt engaged in 1:1 self-care session in ADL apartment focused on attention to task, sequencing, functional transfers.  Pt transferred to toilet with decreased assistance from yesterday, continent of urine and bm at that time.   Transferred to tub bench with increased time to locate bench, which was placed to the R of the pt.  During bathing tasks, pt perseverated on washing her hands, total tactile cues needed to wash body and sequence steps.  Frequently stated that she had already completed the task OT requested she do.  Decreased cues needed for drying body.  Pt donned prosthetic with set-up, direct cues needed to sequence remainder of dressing tasks.  R sided inattention noted throughout tasks as well as ideational apraxia with hairbrush.  Pt required max-total cueing for topic maintenance.  Prefers to have prosthetic on for functional tasks. Complained of residual limb pain and headache, medicated prior to session and RN aware.  Pt demonstrated sensitivity to temperature during bathing.   Second Session: Time:  0830- 0930 Time Calculation: 60 minutes. Pt engaged in 1:1 session focused on following one-step commands, sustained attention, attention to R side. Ambulated with RW from bed to sink for oral care.  Pt required mod to max  cueing to locate items on the R side during oral care at sink. Max cueing to follow one-step commands and for problem solving water temperature. Semantic cues given to assist pt with functional communication during oral care. Pt required re-orientation to situation/place, it is unclear whether pt understood information presented.      Therapy Documentation Precautions:  Precautions Precautions: Fall Precaution Comments: pt with L Prosthetic leg Restrictions Weight Bearing Restrictions: No   Pain: Pain Assessment Pain Assessment: PAINAD Patients Stated Pain Goal: 3 PAINAD (Pain Assessment in Advanced Dementia) Breathing: normal Negative Vocalization: occasional moan/groan, low speech, negative/disapproving quality Facial Expression: sad, frightened, frown Body Language: tense, distressed pacing, fidgeting Consolability: distracted or reassured by voice/touch PAINAD Score: 4  See FIM for current functional status  Therapy/Group: Individual Therapy  Kandis Ban 01/12/2013, 9:41 AM

## 2013-01-12 NOTE — IPOC Note (Signed)
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  Hospital Problems: Principal Problem:   SDH (subdural hematoma)     Functional Problem List: Nursing Bowel;Behavior;Medication Management;Safety;Bladder;Perception;Pain;Endurance;Nutrition  PT Balance;Motor;Safety  OT Balance;Cognition;Behavior;Endurance;Safety;Perception  SLP Cognition;Linguistic  TR         Basic ADL's: OT Eating;Grooming;Bathing;Dressing;Toileting     Advanced  ADL's: OT       Transfers: PT Bed Mobility;Bed to Chair;Car;Furniture  OT Toilet;Tub/Shower     Locomotion: PT Ambulation;Wheelchair Mobility;Stairs     Additional Impairments: OT    SLP Swallowing;Communication expression Social Interaction;Problem Solving;Awareness;Attention  TR      Anticipated Outcomes Item Anticipated Outcome  Self Feeding Supervision  Swallowing  Supervision with least restrictive diet    Basic self-care  Supervision  Toileting  Supervision   Bathroom Transfers Supervision  Bowel/Bladder  continent of bowel and bladder  Transfers  Mod I  Locomotion  Mod I in home environment, MinA in community  Communication  Min A for multimodal communication   Cognition  Min A  Pain  pain less than 2 or equal 2.   Safety/Judgment  patient to be able to use call bell for assistance    Therapy Plan: PT Intensity: Minimum of 1-2 x/day ,45 to 90 minutes PT Frequency: 5 out of 7 days PT Duration Estimated Length of Stay: 2 weeks OT Intensity: Minimum of 1-2 x/day, 45 to 90 minutes OT Frequency: 5 out of 7 days OT Duration/Estimated Length of Stay:  2 weeks SLP Intensity: Minumum of 1-2 x/day, 30 to 90 minutes SLP Frequency: 5 out of 7 days SLP Duration/Estimated Length of Stay: 2 weeks       Team Interventions: Nursing Interventions Bladder Management;Bowel Management;Patient/Family Education;Disease Management/Prevention;Pain  Management;Medication Management;Cognitive Remediation/Compensation;Dysphagia/Aspiration Precaution Training;Discharge Planning;Psychosocial Support  PT interventions Ambulation/gait training;Balance/vestibular training;Cognitive remediation/compensation;Discharge planning;DME/adaptive equipment instruction;Functional mobility training;Neuromuscular re-education;Patient/family education;Stair training;Therapeutic Exercise;Therapeutic Activities;UE/LE Strength taining/ROM;UE/LE Coordination activities;Wheelchair propulsion/positioning;Visual/perceptual remediation/compensation  OT Interventions Cognitive remediation/compensation;Balance/vestibular training;Community reintegration;Discharge planning;DME/adaptive equipment instruction;Functional mobility training;Psychosocial support;Therapeutic Activities;UE/LE Strength taining/ROM;Visual/perceptual remediation/compensation;Wheelchair propulsion/positioning;UE/LE Coordination activities;Therapeutic Exercise;Self Care/advanced ADL retraining;Patient/family education  SLP Interventions Cueing hierarchy;Cognitive remediation/compensation;Environmental controls;Internal/external aids;Speech/Language facilitation;Patient/family education;Therapeutic Activities;Functional tasks;Multimodal communication approach;Dysphagia/aspiration precaution training  TR Interventions    SW/CM Interventions Discharge Planning;Psychosocial Support;Patient/Family Education    Team Discharge Planning: Destination: PT-Home ,OT- Home , SLP-Home Projected Follow-up: PT-Home health PT;24 hour supervision/assistance, OT-  Home health OT, SLP-24 hour supervision/assistance;Outpatient SLP Projected Equipment Needs: PT-None recommended by PT, OT-  , SLP-None recommended by SLP Patient/family involved in discharge planning: PT- Family member/caregiver,  OT-Family member/caregiver;Patient, SLP-Family member/caregiver  MD ELOS: 2 weeks Medical Rehab Prognosis:  Excellent Assessment: The  patient has been admitted for CIR therapies. The team will be addressing, functional mobility, strength, stamina, balance, safety, adaptive techniques/equipment, self-care, bowel and bladder mgt, patient and caregiver education, cognitive linguistic rx, communication, NMR, pain mgt. Goals have been set at supervision for basic ADL's, mod I for mobility, minimal assist for cognitive/language issues.Ranelle Oyster, MD, FAAPMR      See Team Conference Notes for weekly updates to the plan of care

## 2013-01-12 NOTE — Progress Notes (Signed)
This note has been reviewed and this clinician agrees with information provided.  

## 2013-01-13 ENCOUNTER — Encounter (HOSPITAL_COMMUNITY): Payer: Medicare Other | Admitting: Occupational Therapy

## 2013-01-13 ENCOUNTER — Inpatient Hospital Stay (HOSPITAL_COMMUNITY): Payer: Medicare Other | Admitting: Speech Pathology

## 2013-01-13 ENCOUNTER — Inpatient Hospital Stay (HOSPITAL_COMMUNITY): Payer: Medicare Other | Admitting: Occupational Therapy

## 2013-01-13 ENCOUNTER — Inpatient Hospital Stay (HOSPITAL_COMMUNITY): Payer: Medicare Other | Admitting: Physical Therapy

## 2013-01-13 DIAGNOSIS — W19XXXA Unspecified fall, initial encounter: Secondary | ICD-10-CM

## 2013-01-13 DIAGNOSIS — S065X9A Traumatic subdural hemorrhage with loss of consciousness of unspecified duration, initial encounter: Secondary | ICD-10-CM

## 2013-01-13 DIAGNOSIS — S88119A Complete traumatic amputation at level between knee and ankle, unspecified lower leg, initial encounter: Secondary | ICD-10-CM

## 2013-01-13 DIAGNOSIS — S065XAA Traumatic subdural hemorrhage with loss of consciousness status unknown, initial encounter: Secondary | ICD-10-CM

## 2013-01-13 MED ORDER — OXYCODONE HCL 5 MG PO TABS
5.0000 mg | ORAL_TABLET | ORAL | Status: DC | PRN
  Administered 2013-01-13 – 2013-01-19 (×10): 5 mg via ORAL
  Filled 2013-01-13 (×10): qty 1

## 2013-01-13 NOTE — Progress Notes (Signed)
Subjective/Complaints: Slept well. No obvious distress or complaints A 12 point review of systems has been performed and if not noted above is otherwise negative.   Objective: Vital Signs: Blood pressure 125/82, pulse 91, temperature 98.7 F (37.1 C), temperature source Oral, resp. rate 18, weight 94.802 kg (209 lb), SpO2 94.00%. No results found.  Recent Labs  01/11/13 0545  WBC 12.8*  HGB 18.6*  HCT 51.0*  PLT 448*    Recent Labs  01/11/13 0545  NA 127*  K 3.3*  CL 85*  GLUCOSE 118*  BUN 20  CREATININE 0.99  CALCIUM 10.1   CBG (last 3)  No results found for this basename: GLUCAP,  in the last 72 hours  Wt Readings from Last 3 Encounters:  01/11/13 94.802 kg (209 lb)  01/09/13 95 kg (209 lb 7 oz)    Physical Exam:  Gen: pt is difficult to arouse  HENT:  Head: Normocephalic.  Eyes: EOM are normal.  No nystagmus  Neck: Normal range of motion. Neck supple. No thyromegaly present.  Cardiovascular: Normal rate and regular rhythm. No murmurs  Pulmonary/Chest: Effort normal and breath sounds normal. No respiratory distress. No wheezes  Abdominal: Soft. Bowel sounds are normal. She exhibits no distension.  Musculoskeletal:  Left BKA. Limb well-formed, non-tender Neurological:  Moved all 4's without focal weakness.  Sensed pain in all 4's. Could answer simple questions. Said "thank you" as I was leaving but otherwise "word salad"---could not respond or comprehend any of my language today. Better with spontaneous phrases. No focal CN deficits Skin: Skin is warm and dry.  Psychiatric:  alert   Assessment/Plan: 1. Functional deficits secondary to bilateral [facility] which require 3+ hours per day of interdisciplinary therapy in a comprehensive inpatient rehab setting. Physiatrist is providing close team supervision and 24 hour management of active medical problems listed below. Physiatrist and rehab team continue to assess barriers to discharge/monitor patient progress  toward functional and medical goals. FIM: FIM - Bathing Bathing Steps Patient Completed: Chest;Abdomen;Right Arm;Left Arm;Front perineal area;Left upper leg;Right upper leg;Right lower leg (including foot) Bathing: 4: Min-Patient completes 8-9 46f 10 parts or 75+ percent  FIM - Upper Body Dressing/Undressing Upper body dressing/undressing steps patient completed: Thread/unthread right sleeve of pullover shirt/dresss;Thread/unthread left sleeve of pullover shirt/dress;Put head through opening of pull over shirt/dress;Pull shirt over trunk Upper body dressing/undressing: 5: Set-up assist to: Obtain clothing/put away FIM - Lower Body Dressing/Undressing Lower body dressing/undressing steps patient completed: Thread/unthread right underwear leg;Thread/unthread left underwear leg;Pull underwear up/down;Thread/unthread right pants leg;Thread/unthread left pants leg;Pull pants up/down;Don/Doff right sock;Don/Doff right shoe Lower body dressing/undressing: 4: Steadying Assist  FIM - Toileting Toileting steps completed by patient: Adjust clothing prior to toileting;Performs perineal hygiene Toileting: 4: Steadying assist  FIM - Diplomatic Services operational officer Devices: Grab bars Toilet Transfers: 4-To toilet/BSC: Min A (steadying Pt. > 75%);4-From toilet/BSC: Min A (steadying Pt. > 75%)  FIM - Banker Devices: Walker;Prosthesis;Bed rails Bed/Chair Transfer: 3: Sit > Supine: Mod A (lifting assist/Pt. 50-74%/lift 2 legs);3: Chair or W/C > Bed: Mod A (lift or lower assist)  FIM - Locomotion: Wheelchair Locomotion: Wheelchair: 0: Activity did not occur FIM - Locomotion: Ambulation Locomotion: Ambulation Assistive Devices: Designer, industrial/product Ambulation/Gait Assistance: 3: Mod assist Locomotion: Ambulation: 2: Travels 50 - 149 ft with moderate assistance (Pt: 50 - 74%)  Comprehension Comprehension Mode: Auditory Comprehension: 3-Understands basic 50  - 74% of the time/requires cueing 25 - 50%  of the time  Expression Expression  Mode: Verbal Expression: 2-Expresses basic 25 - 49% of the time/requires cueing 50 - 75% of the time. Uses single words/gestures.  Social Interaction Social Interaction: 2-Interacts appropriately 25 - 49% of time - Needs frequent redirection.  Problem Solving Problem Solving: 1-Solves basic less than 25% of the time - needs direction nearly all the time or does not effectively solve problems and may need a restraint for safety  Memory Memory: 1-Recognizes or recalls less than 25% of the time/requires cueing greater than 75% of the time  Medical Problem List and Plan:  1. Bilateral subdural hematomas after recent/remote fall  2. DVT Prophylaxis/Anticoagulation: SCDs. Monitor for signs of DVT. History of DVT and pulmonary emboli. Coumadin discontinued secondary to subdural hematoma  3. Pain Management: Neurontin 600 mg twice a day, Tylenol as needed. Monitor with increased mobility. Patient was taking OxyContin sustained-release 10 mg 3 times a day prior to admission. Plan to resume as/when tolerated and needed. --controlled it appears at present 4. Mood/anxiety. Valium 5 mg every 6 hours as needed--limit as possible given neuro-cognitive deficits  5. Neuropsych: This patient is not capable of making decisions on her own behalf.  6. Seizure prophylaxis. Keppra 500 mg twice a day. Monitor for any signs of seizure  7. 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 Hypothyroidism. Synthroid  9. Hypertension. Lasix 40 mg twice a day, hydrochlorothiazide 25 mg daily. Controlled at present 10. Hypokalemia. Continue supplement and followup labs--continue to replace 11. Leukocytosis--may be reactive. Recent urine cx negative. Still with 12.8k wbc's 12. Hemochromatosis--may be behind her increased hemoglobin---has been elevated it appears chronically.  LOS (Days) 3 A FACE TO FACE EVALUATION WAS  PERFORMED  Frances Finley T 01/13/2013 8:03 AM

## 2013-01-13 NOTE — Progress Notes (Signed)
Pt. c/o headache this AM; "bad" 2 tylenol given; 1 hour later S.T. Reports pt tearful, crying with pain,headache; VSS, assisted to room, Harvel Ricks [facility] aware; orders received; Oxy IR 5mg  given, effective.

## 2013-01-13 NOTE — Progress Notes (Signed)
Occupational Therapy Session Note  Patient Details  Name: Frances Finley MRN: [id number] Date of Birth: 06/18/1955  Today's Date: 01/13/2013     Short Term Goals: Week 1:  OT Short Term Goal 1 (Week 1): Pt will bathe with mod verbal/tactile cues and min A.  OT Short Term Goal 2 (Week 1): Pt will complete toilet transfer with min A.  OT Short Term Goal 3 (Week 1): Pt will demonstrate sustaing attention for 1 grooming task with mod verbal cues. OT Short Term Goal 4 (Week 1): Pt will complete tub/shower transfer with min A, min verbal and tactile cues  Skilled Therapeutic Interventions/Progress Updates:   First Session: Individual Time:  0830- 0930 Time Calculation: 60 minutes.   Pt engaged in 1:1 session on self-care retraining with focus on sustained attention, sequencing, attention to R side, activity tolerance.  Pt in w/c upon entry to room eating breakfast.  Max  cues to choose an appropriate utensil for cereal, pt not pleased with selection of food on tray.  Bathing/dressing tasks occured in ADL apartment.  Pt transferred to tub bench, cues to undress before bathing.  Bathed with mod-max cues, increased initiation of sequencing bathing body parts than yesterday, less HOH assist needed, increased response to verbal cues.  Pt donned prosthetic and dressed UB with supervision/set-up with min verbal cues. Min A for standing balance during LB dressing.  Brushed hair, beginning on the R side, after being handed the [place] and no further cueing.  Terminated the majority of tasks this session appropriately. Oral care completed in room, max-total cues for task of applying toothpaste to toothbrush and turning on water.  Pt having snack at conclusion of session, supervised by Nurse Tech.   Second Session: Individual Time 1400-1430 Time Calculation 30 minutes Pt engaged in 1:1 session focused on activity tolerance, sustained attention, problem-solving.  Pt in ADL kitchen to bake cookies.  Pt  unable to identify correct utensil from drawer when given verbal directions and through trial and error, pt unaware of errors in performance.  OT demonstrated task of scooping dough and placing on sheet and requested pt engage in same action, pt able to do so only with [facility] assist to initiate.  Able to demonstrate selective attention.  Pt chose correct object out of field of three when asked which object would perform a specific task appropriately, then demonstrated simple problem-solving by utilizing the utensil appropriately. Requested mid-session to toilet, hesitant to complete transfer without prosthetic but able to do so with encouragement. Increased assist needed for clothing management without prosthetic.  Frequent complaints of pain in residual limb, notified RN of pts request for pain medication. Transferred to bed at conclusion of session with family present.   Therapy Documentation Precautions:  Precautions Precautions: Fall Precaution Comments: pt with L Prosthetic leg Restrictions Weight Bearing Restrictions: No  See FIM for current functional status  Therapy/Group: Individual Therapy  Kandis Ban 01/13/2013, 10:43 AM

## 2013-01-13 NOTE — Progress Notes (Addendum)
Physical Therapy Session Note  Patient Details  Name: Keri Veale MRN: [id number] Date of Birth: [birth year]  Today's Date: 01/13/2013 Time: 1300-1355 Time Calculation (min): 55 min  Short Term Goals: Week 1:  PT Short Term Goal 1 (Week 1): pt will perform bed mobility with S without use of rails.   PT Short Term Goal 2 (Week 1): pt will demo transfers MinA.   PT Short Term Goal 3 (Week 1): pt will amb with RW and MinA 100' in controlled environment.   PT Short Term Goal 4 (Week 1): pt will don/doff prosthesis with only set-up A and verbal cues to initiate task.    Skilled Therapeutic Interventions/Progress Updates:    Very difficult to arouse, max assist to roll progressing to min assist to upright trunk to sitting as pt awakened. Pt had not attempted lunch. Worked on focused/sustained attention while eating lunch, phonemic cues for identifying foods max to total assist, unable to recall which food she had just eaten. Able to state colors with 75% accuracy.  Ambulation x 40' with RW and min/mod assist pt's risk of falls elevates greatly with distraction and needs chair to follow for when she is unable to safely continue. Pt with decreased ability to follow verbal cues during gait but responded reasonably well to gestures. Goals adjusted for anticipated progress.  Significantly distracted by pain in leg, prosthesis removed and erythema  noted at distal tibia and distal thigh, area gone by end of treatment. Pt's hamstrings tight, stretch provided and educated family on positioning in bed.   Wheelchair propulsion with intermittent hand over hand assist. Attempted functional task of watering plants at wheelchair level, demonstration then pt able to perform with mod verbal cues. Inconsistently follows one step instruction.   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    Therapy Documentation Precautions:   Precautions Precautions: Fall Precaution Comments: pt with L Prosthetic leg Restrictions Weight Bearing Restrictions: No Pain: Pain Assessment Pain Assessment: Faces Faces Pain Scale: Hurts even more Pain Location: Head Pain Orientation:  ("all over") Pain Descriptors / Indicators: Aching Pain Onset: On-going Pain Intervention(s): Repositioned Multiple Pain Sites: Yes 2nd Pain Site Wong-Baker Pain Rating: Hurts little more Pain Type: Acute pain Pain Location: Leg Pain Orientation: Left Pain Onset: With Activity Pain Intervention(s):  (removed prosthetic limb)  See FIM for current functional status  Therapy/Group: Individual Therapy  Wilhemina Bonito 01/13/2013, 3:31 PM

## 2013-01-13 NOTE — Progress Notes (Signed)
Speech Language Pathology Daily Session Note  Patient Details  Name: Frances Finley MRN: [id number] Date of Birth: 1955-09-22  Today's Date: 01/13/2013 Time: 4098-1191 Time Calculation (min): 55 min  Short Term Goals: Week 1: SLP Short Term Goal 1 (Week 1): Pt will sustain attention to a functional task for 60 seconds with Max A multimodal cues for redirection SLP Short Term Goal 2 (Week 1): Pt will answer yes/no questions in regards to biographical information with 50% accuracy with Mod A multimodal cues.  SLP Short Term Goal 3 (Week 1): Pt will follow 1 step commands with Max A multimodal cueing  SLP Short Term Goal 4 (Week 1): Pt will identify familiar items with Max A multimodal cueing  SLP Short Term Goal 5 (Week 1): Pt will self-monitor to decrease length of utternace during verbal responses with Max A multimodal cueing SLP Short Term Goal 6 (Week 1): Pt will demonstrate efficient mastication of regular textures with supervision verbal cues without overt s/s of aspiration.   Skilled Therapeutic Interventions: Treatment focus on cognitive-linguistic goals. Upon arrival, pt reported a headache. RN made aware and administered medications.  Pt required Mod multimodal cueing with visual, gestural, written and verbal cues for auditory comprehension during a functional conversation in regards to biographical information.  Pt's verbal responses were appropriate 25% of the time, however, pt demonstrated increased ability to self-monitor errors and would laugh aloud when errors occurred.  Pt became emotionally upset during the session reporting, "I don't remember, I don't understand," indicative of increased awareness.  Pt also continued to report increased pain in head, RN made aware and new medication administered.  Pt independently completed sorting task from field of 9 with 100% accuracy and sorted change from a field of 4 with supervision verbal cues and 100% accuracy.  Family present throughout  session and asked appropriate questions.    FIM:  Comprehension Comprehension Mode: Auditory Comprehension: 3-Understands basic 50 - 74% of the time/requires cueing 25 - 50%  of the time Expression Expression Mode: Verbal Expression: 2-Expresses basic 25 - 49% of the time/requires cueing 50 - 75% of the time. Uses single words/gestures. Social Interaction Social Interaction: 3-Interacts appropriately 50 - 74% of the time - May be physically or verbally inappropriate. Problem Solving Problem Solving: 2-Solves basic 25 - 49% of the time - needs direction more than half the time to initiate, plan or complete simple activities Memory Memory: 1-Recognizes or recalls less than 25% of the time/requires cueing greater than 75% of the time  Pain Pain Assessment Pain Assessment: 0-10 Pain Type: Acute pain Pain Location: Head Pain Orientation: Right;Left;Posterior Pain Descriptors / Indicators: Aching Pain Onset: On-going Patients Stated Pain Goal: 2 Pain Intervention(s): Medication (See eMAR) Multiple Pain Sites: No PAINAD (Pain Assessment in Advanced Dementia) Breathing: normal Negative Vocalization: repeated troubled calling out, loud moaning/groaning, crying Facial Expression: facial grimacing Body Language: tense, distressed pacing, fidgeting Consolability: distracted or reassured by voice/touch PAINAD Score: 6  Therapy/Group: Individual Therapy  Frances Finley 01/13/2013, 12:18 PM

## 2013-01-14 ENCOUNTER — Inpatient Hospital Stay (HOSPITAL_COMMUNITY): Payer: Medicare Other | Admitting: Speech Pathology

## 2013-01-14 ENCOUNTER — Inpatient Hospital Stay (HOSPITAL_COMMUNITY): Payer: Medicare Other | Admitting: *Deleted

## 2013-01-14 ENCOUNTER — Inpatient Hospital Stay (HOSPITAL_COMMUNITY): Payer: Medicare Other | Admitting: Physical Therapy

## 2013-01-14 DIAGNOSIS — I6200 Nontraumatic subdural hemorrhage, unspecified: Secondary | ICD-10-CM

## 2013-01-14 NOTE — Progress Notes (Signed)
Physical Therapy Session Note  Patient Details  Name: Frances Finley MRN: [id number] Date of Birth: 05/04/1956  Today's Date: 01/14/2013 Time: 4098-1191 Time Calculation (min): 25 min  Short Term Goals: Week 1:  PT Short Term Goal 1 (Week 1): pt will perform bed mobility with S without use of rails.   PT Short Term Goal 2 (Week 1): pt will demo transfers MinA.   PT Short Term Goal 3 (Week 1): pt will amb with RW and MinA 100' in controlled environment.   PT Short Term Goal 4 (Week 1): pt will don/doff prosthesis with only set-up A and verbal cues to initiate task.    Skilled Therapeutic Interventions/Progress Updates:    Pt supine in bed, reports need to urinate. Able to don prosthesis with supervision. Ambulation to/from bathroom with RW and min assist, cues for RW negotiation pt continues to improve with receptive aphasia. Pt able to change final destination from bed to a chair during ambulation demonstrating improved ability to follow cues and good mental flexibility. Standing balance toss/catch with one UE support. Pt then reports "I'm done" and returned to bed with RW and min-guard assist.   Perseverating on desire to have salt on her food. Pt unable to recall situation or why she needs therapy. Able to recall making cookies multiple times today however unable to produce the word "cookies" and often makes a circle with her hands and calls it "bread" or "cake." Pt does not remember PT session from yesterday. More jargon noted than this morning.   Therapy Documentation Precautions:  Precautions Precautions: Fall Precaution Comments: pt with L Prosthetic leg Restrictions Weight Bearing Restrictions: No Pain: Pain Assessment Faces Pain Scale: Hurts little more Pain Type: Acute pain Pain Location: Head Pain Descriptors / Indicators: Headache Pain Onset: On-going Pain Intervention(s): Repositioned (rest at end of session)  See FIM for current functional status  Therapy/Group:  Individual Therapy  Wilhemina Bonito 01/14/2013, 3:51 PM

## 2013-01-14 NOTE — Progress Notes (Signed)
Speech Language Pathology Daily Session Note  Patient Details  Name: Frances Finley MRN: [id number] Date of Birth: Jan 09, 1956  Today's Date: 01/14/2013 Time: 0820-0845 Time Calculation (min): 25 min  Short Term Goals: Week 1: SLP Short Term Goal 1 (Week 1): Pt will sustain attention to a functional task for 60 seconds with Max A multimodal cues for redirection SLP Short Term Goal 2 (Week 1): Pt will answer yes/no questions in regards to biographical information with 50% accuracy with Mod A multimodal cues.  SLP Short Term Goal 3 (Week 1): Pt will follow 1 step commands with Max A multimodal cueing  SLP Short Term Goal 4 (Week 1): Pt will identify familiar items with Max A multimodal cueing  SLP Short Term Goal 5 (Week 1): Pt will self-monitor to decrease length of utternace during verbal responses with Max A multimodal cueing SLP Short Term Goal 6 (Week 1): Pt will demonstrate efficient mastication of regular textures with supervision verbal cues without overt s/s of aspiration.   Skilled Therapeutic Interventions: Therapeutic intervention complete with short term goals addressed.  The patient's session was divided into two sessions today.  First session targeted expressive language, with confrontation naming which required max multimodal cues to correctly name common objects. Personal information also required max multimodal cues as well.   Expressive language largely consists of jargon and neologisms which can be challenging to redirect at times.  Her second visit targeted mastication of regular, soft texture, no s/s of aspiration, mildly prolonged oral phase.  Therapy also targeted verbally expressing rote information (counting to 5, first three months of year).  She required max multimodal cueing with this as well but was able to correctly count and say months after multiple attempts.  Continue with current plan.   FIM:  Comprehension Comprehension Mode: Auditory Comprehension:  3-Understands basic 50 - 74% of the time/requires cueing 25 - 50%  of the time Expression Expression Mode: Verbal Expression: 2-Expresses basic 25 - 49% of the time/requires cueing 50 - 75% of the time. Uses single words/gestures. Social Interaction Social Interaction: 3-Interacts appropriately 50 - 74% of the time - May be physically or verbally inappropriate. Problem Solving Problem Solving: 2-Solves basic 25 - 49% of the time - needs direction more than half the time to initiate, plan or complete simple activities Memory Memory: 1-Recognizes or recalls less than 25% of the time/requires cueing greater than 75% of the time FIM - Eating Eating Activity: 5: Supervision/cues;5: Set-up assist for open containers  Pain Pain Assessment Pain Assessment: 0-10 Faces Pain Scale: Hurts little more Pain Type: Acute pain Pain Location: Head Pain Descriptors / Indicators: Headache Pain Onset: On-going Pain Intervention(s): Repositioned (rest at end of session)  Therapy/Group: Individual Therapy  Lenny Pastel 01/14/2013, 4:14 PM

## 2013-01-14 NOTE — Progress Notes (Signed)
Occupational Therapy Note  Patient Details  Name: Frances Finley MRN: [id number] Date of Birth: 1956-04-08 Today's Date: 01/14/2013  Time: 0930-1030  (60 min) Pain:  Head ( 8/10) Individual session  Pt. Lying in bed upon OT arrival.  Addressed bed mobility, transfers, standing tolerance, attention to left, selective attention.  Pt. Moved from supine to EOB with bed rails and supervision.  Pt transferred to wc with minimal assist.  Performed grooming at sink in sitting and standing.   Stood for 5 minutes to brush teeth.  Propelled wc to kitchen and stood and washed dishsd with minimal  assistance for balance and minimal assist with organizing, sequencing.  Pt. washed dishes and able to place in dishwasher with minimal assist for balance.   Pt. Complaining more and more of headache pain as session progressed.  .  See MAR for pain meds. 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   Pt.propelled wc back to room with verbal cues for finding way back and not running into things on left.         Humberto Seals 01/14/2013, 9:51 AM

## 2013-01-14 NOTE — Progress Notes (Signed)
Physical Therapy Session Note  Patient Details  Name: Frances Finley MRN: [id number] Date of Birth: [birth year]  Today's Date: 01/14/2013 Time: 3086-5784 Time Calculation (min): 55 min  Short Term Goals: Week 1:  PT Short Term Goal 1 (Week 1): pt will perform bed mobility with S without use of rails.   PT Short Term Goal 2 (Week 1): pt will demo transfers MinA.   PT Short Term Goal 3 (Week 1): pt will amb with RW and MinA 100' in controlled environment.   PT Short Term Goal 4 (Week 1): pt will don/doff prosthesis with only set-up A and verbal cues to initiate task.    Skilled Therapeutic Interventions/Progress Updates:    Pt initially resistant to get out of bed but eventually agreed. Pt able to ambulate x 100' with min assist in minimally distracting environment and stopped appropriately with verbal cues. Wheelchair propulsion x 160' with two instances of min assist to avoid obstacles, task for improved attention and functional memory. Ambulation over carpeted surface in solarium x 30', 50' min assist, sit <> stands from low compliant chair with close supervision. Pt stated she needed to urinate, max/total assist verbal cues for pathfinding back to unit. Bathroom mobility with RW and min assist, pt able to manage clothing and cleaning self with steady assist.   Receptive aphasia much improved today but expressive continues to be very impaired, fluent aphasia that worsens with fatigue. Little to no self correction noted today.  Pt able to doff prosthesis easily.   Therapy Documentation Precautions:  Precautions Precautions: Fall Precaution Comments: pt with L Prosthetic leg Restrictions Weight Bearing Restrictions: No Pain: Pain Assessment Faces Pain Scale: Hurts even more Pain Type: Acute pain Pain Location: Head Pain Descriptors / Indicators: Headache Pain Onset: On-going Pain Intervention(s): Repositioned;Rest  See FIM for current functional status  Therapy/Group:  Individual Therapy  Wilhemina Bonito 01/14/2013, 11:46 AM

## 2013-01-14 NOTE — Progress Notes (Signed)
Patient ID: Frances Finley, female   DOB: 17-Jan-1956, 57 y.o.   MRN: [id number] Subjective/Complaints:  56/56. 57 year old patient admitted to rehabilitation following a subdural hematoma. Medical problems include a history of hemachromatosis anxiety. She has had a prior left BKA Complaints today include mild nausea with medication ingestion only   Objective: Vital Signs: Blood pressure 123/72, pulse 99, temperature 97.8 F (36.6 C), temperature source Oral, resp. rate 20, weight 94.802 kg (209 lb), SpO2 99.00%. No results found. No results found for this basename: WBC, HGB, HCT, PLT,  in the last 72 hours No results found for this basename: NA, K, CL, CO, GLUCOSE, BUN, CREATININE, CALCIUM,  in the last 72 hours CBG (last 3)  No results found for this basename: GLUCAP,  in the last 72 hours  Wt Readings from Last 3 Encounters:  01/11/13 94.802 kg (209 lb)  01/09/13 95 kg (209 lb 7 oz)   Patient Vitals for the past 24 hrs:  BP Temp Temp src Pulse Resp SpO2  01/14/13 0500 123/72 mmHg 97.8 F (36.6 C) Oral 99 20 99 %  01/13/13 1549 135/86 mmHg 98.2 F (36.8 C) Oral 77 19 97 %    No intake or output data in the 24 hours ending 01/14/13 4098  Physical Exam:  Gen: pt is difficult to arouse  HENT:  Head: Normocephalic.  Eyes: EOM are normal.  No nystagmus  Neck: Normal range of motion. Neck supple. No thyromegaly present.  Cardiovascular: Normal rate and regular rhythm. No murmurs  Pulmonary/Chest: Effort normal and breath sounds normal. No respiratory distress. No wheezes  Abdominal: Soft. Bowel sounds are normal. She exhibits no distension.  Musculoskeletal:  Left BKA. Limb well-formed, non-tender Neurological:   language deficit but  No focal CN deficits Skin: Skin is warm and dry.  Psychiatric:  alert   Assessment/Plan: 1. Functional deficits secondary to bilateral [facility] which require 3+ hours per day of interdisciplinary therapy in a comprehensive inpatient rehab  setting. Seizure prophylaxis. Keppra 500 mg twice a day. Monitor for any signs of seizure  2. History of left BKA. Patient does have a prosthesis  3. Hypothyroidism. Synthroid  4. Hypertension. Lasix 40 mg twice a day, hydrochlorothiazide 25 mg daily. Controlled at present   LOS (Days) 4 A FACE TO FACE EVALUATION WAS PERFORMED  Rogelia Boga 01/14/2013 9:21 AM

## 2013-01-15 ENCOUNTER — Inpatient Hospital Stay (HOSPITAL_COMMUNITY): Payer: Medicare Other

## 2013-01-15 NOTE — Progress Notes (Signed)
Patient ID: Frances Finley, female   DOB: [birth year], 57 y.o.   MRN: [id number] Patient ID: Frances Finley, female   DOB: 05-17-1955, 57 y.o.   MRN: [id number] Subjective/Complaints:  56/78.  57 year old patient admitted to rehabilitation following a subdural hematoma. Medical problems include a history of hemachromatosis anxiety. She has had a prior left BKA No newcomplaints today. She states that her nausea has resolved   Objective: Vital Signs: Blood pressure 138/83, pulse 96, temperature 97.7 F (36.5 C), temperature source Oral, resp. rate 19, weight 94.802 kg (209 lb), SpO2 99.00%. No results found. No results found for this basename: WBC, HGB, HCT, PLT,  in the last 72 hours No results found for this basename: NA, K, CL, CO, GLUCOSE, BUN, CREATININE, CALCIUM,  in the last 72 hours CBG (last 3)  No results found for this basename: GLUCAP,  in the last 72 hours  Wt Readings from Last 3 Encounters:  01/11/13 94.802 kg (209 lb)  01/09/13 95 kg (209 lb 7 oz)   Patient Vitals for the past 24 hrs:  BP Temp Temp src Pulse Resp SpO2  01/15/13 0615 138/83 mmHg 97.7 F (36.5 C) Oral 96 19 99 %  01/14/13 1412 124/85 mmHg 98.1 F (36.7 C) Oral 96 20 95 %     Intake/Output Summary (Last 24 hours) at 01/15/13 0836 Last data filed at 01/15/13 0815  Gross per 24 hour  Intake    600 ml  Output      0 ml  Net    600 ml    Physical Exam:  Gen: alert and appropriate HENT:  Head: Normocephalic.  Eyes: EOM are normal.  No nystagmus  Neck: Normal range of motion. Neck supple. No thyromegaly present.  Cardiovascular: Normal rate and regular rhythm. No murmurs  Pulmonary/Chest: Effort normal and breath sounds normal. No respiratory distress. No wheezes  Abdominal: Soft. Bowel sounds are normal. She exhibits no distension.  Musculoskeletal:  Left BKA. Limb well-formed, non-tender Neurological:    No focal CN deficits Skin: Skin is warm and dry.  Psychiatric:   alert   Assessment/Plan: 1. Functional deficits secondary to bilateral [facility] which require 3+ hours per day of interdisciplinary therapy in a comprehensive inpatient rehab setting. Seizure prophylaxis. Keppra 500 mg twice a day. Monitor for any signs of seizure  2. History of left BKA. Patient does have a prosthesis  3. Hypothyroidism. Synthroid  4. Hypertension. Lasix 40 mg twice a day, hydrochlorothiazide 25 mg daily. Controlled at present   LOS (Days) 5 A FACE TO FACE EVALUATION WAS PERFORMED  Rogelia Boga 01/15/2013 8:36 AM

## 2013-01-15 NOTE — Progress Notes (Signed)
Physical Therapy Session Note  Patient Details  Name: Frances Finley MRN: [id number] Date of Birth: [birth year]  Today's Date: 01/15/2013 Time: 0900-0930 Time Calculation (min): 30 min  Short Term Goals: Week 1:  PT Short Term Goal 1 (Week 1): pt will perform bed mobility with S without use of rails.   PT Short Term Goal 2 (Week 1): pt will demo transfers MinA.   PT Short Term Goal 3 (Week 1): pt will amb with RW and MinA 100' in controlled environment.   PT Short Term Goal 4 (Week 1): pt will don/doff prosthesis with only set-up A and verbal cues to initiate task.    Skilled Therapeutic Interventions/Progress Updates:  1:1. Pt received semi-reclined in bed lightly sleeping, easy to wake. Pt resistant to therapy at first complaining of abdominal discomfort from BMs and headache, RN aware. Pt declined need to use bathroom. Pt agreeable to participate in therapy w/ min encouragement/re-direction. Pt able to t/f sup<>sit EOB w/ supervision, very minimal use of bed rail and HOB slightly elevated. Prosthesis already donned. Pt agreeable to "go for a walk", but upon reaching the door stated she did not feel good and was not sure how far she could go, able to walk back to bed for seated rest. Pt able to demonstrate second walk from bed<>door, however, upon return declined further amb/activity 2/2 headache. Overall pt able to amb 30'x2 w/ RW and min-guard to min A x1person, especially during turns. Pt req Supervision while sitting EOB at end of tx session to eat yogurt, declined to eat anything more as it was unsettling to her stomach. Pt demonstated mild receptive aphasia this tx session, however, expressive aphasia continues to remain very impaired. Pt semi-reclined in bed at end of tx session w/ all needs in reach and bed alarm on.   Therapy Documentation Precautions:  Precautions Precautions: Fall Precaution Comments: pt with L Prosthetic leg Restrictions Weight Bearing Restrictions:  No General: Amount of Missed PT Time (min): 15 Minutes Missed Time Reason: Patient ill (comment);Patient fatigue (2/2 headache) Pain: Pain Assessment Pain Assessment: Faces Faces Pain Scale: Hurts whole lot Pain Location: Head Pain Descriptors / Indicators: Headache Pain Intervention(s): RN made aware  See FIM for current functional status  Therapy/Group: Individual Therapy  Denzil Hughes 01/15/2013, 10:01 AM

## 2013-01-16 ENCOUNTER — Inpatient Hospital Stay (HOSPITAL_COMMUNITY): Payer: Medicare Other | Admitting: Physical Therapy

## 2013-01-16 ENCOUNTER — Inpatient Hospital Stay (HOSPITAL_COMMUNITY): Payer: Medicare Other | Admitting: Speech Pathology

## 2013-01-16 ENCOUNTER — Encounter (HOSPITAL_COMMUNITY): Payer: Medicare Other | Admitting: Occupational Therapy

## 2013-01-16 DIAGNOSIS — S065X9A Traumatic subdural hemorrhage with loss of consciousness of unspecified duration, initial encounter: Secondary | ICD-10-CM

## 2013-01-16 DIAGNOSIS — W19XXXA Unspecified fall, initial encounter: Secondary | ICD-10-CM

## 2013-01-16 DIAGNOSIS — S88119A Complete traumatic amputation at level between knee and ankle, unspecified lower leg, initial encounter: Secondary | ICD-10-CM

## 2013-01-16 NOTE — Progress Notes (Signed)
Subjective/Complaints: Denies any new complaints. Headaches are improving. A 12 point review of systems has been performed and if not noted above is otherwise negative.   Objective: Vital Signs: Blood pressure 128/89, pulse 99, temperature 97.7 F (36.5 C), temperature source Oral, resp. rate 18, weight 94.802 kg (209 lb), SpO2 98.00%. No results found. No results found for this basename: WBC, HGB, HCT, PLT,  in the last 72 hours No results found for this basename: NA, K, CL, CO, GLUCOSE, BUN, CREATININE, CALCIUM,  in the last 72 hours CBG (last 3)  No results found for this basename: GLUCAP,  in the last 72 hours  Wt Readings from Last 3 Encounters:  01/11/13 94.802 kg (209 lb)  01/09/13 95 kg (209 lb 7 oz)    Physical Exam:  Gen: pt is difficult to arouse  HENT:  Head: Normocephalic.  Eyes: EOM are normal.  No nystagmus  Neck: Normal range of motion. Neck supple. No thyromegaly present.  Cardiovascular: Normal rate and regular rhythm. No murmurs  Pulmonary/Chest: Effort normal and breath sounds normal. No respiratory distress. No wheezes  Abdominal: Soft. Bowel sounds are normal. She exhibits no distension.  Musculoskeletal:  Left BKA. Limb well-formed, non-tender Neurological:  Moved all 4's without focal weakness.  Sensed pain in all 4's. Could answer simple questions. Better comprehension. Speaking in short setences and phrases with better content. Still substitutes words frequently.  Skin: Skin is warm and dry.  Psychiatric:  alert   Assessment/Plan: 1. Functional deficits secondary to bilateral [facility] which require 3+ hours per day of interdisciplinary therapy in a comprehensive inpatient rehab setting. Physiatrist is providing close team supervision and 24 hour management of active medical problems listed below. Physiatrist and rehab team continue to assess barriers to discharge/monitor patient progress toward functional and medical goals. FIM: FIM -  Bathing Bathing Steps Patient Completed: Chest;Abdomen;Right upper leg;Left lower leg (including foot);Left upper leg;Front perineal area;Right Arm;Left Arm;Buttocks;Right lower leg (including foot) Bathing: 5: Set-up assist to: Obtain items  FIM - Upper Body Dressing/Undressing Upper body dressing/undressing steps patient completed: Thread/unthread right sleeve of pullover shirt/dresss;Thread/unthread left sleeve of pullover shirt/dress;Put head through opening of pull over shirt/dress;Pull shirt over trunk Upper body dressing/undressing: 5: Set-up assist to: Obtain clothing/put away FIM - Lower Body Dressing/Undressing Lower body dressing/undressing steps patient completed: Thread/unthread right underwear leg;Thread/unthread left underwear leg;Pull underwear up/down;Thread/unthread right pants leg;Thread/unthread left pants leg;Pull pants up/down;Don/Doff right sock;Don/Doff right shoe Lower body dressing/undressing: 4: Steadying Assist  FIM - Toileting Toileting steps completed by patient: Adjust clothing prior to toileting;Performs perineal hygiene;Adjust clothing after toileting Toileting Assistive Devices: Grab bar or rail for support Toileting: 4: Steadying assist  FIM - Diplomatic Services operational officer Devices: Grab bars Toilet Transfers: 4-From toilet/BSC: Min A (steadying Pt. > 75%);4-To toilet/BSC: Min A (steadying Pt. > 75%)  FIM - Bed/Chair Transfer Bed/Chair Transfer Assistive Devices: Bed rails;HOB elevated Bed/Chair Transfer: 5: Sit > Supine: Supervision (verbal cues/safety issues);5: Supine > Sit: Supervision (verbal cues/safety issues)  FIM - Locomotion: Wheelchair Locomotion: Wheelchair: 0: Activity did not occur FIM - Locomotion: Ambulation Locomotion: Ambulation Assistive Devices: Designer, industrial/product Ambulation/Gait Assistance: 4: Min assist Locomotion: Ambulation: 1: Travels less than 50 ft with minimal assistance (Pt.>75%)  Comprehension Comprehension  Mode: Auditory Comprehension: 3-Understands basic 50 - 74% of the time/requires cueing 25 - 50%  of the time  Expression Expression Mode: Verbal Expression: 2-Expresses basic 25 - 49% of the time/requires cueing 50 - 75% of the time. Uses single words/gestures.  Social Interaction Social Interaction: 3-Interacts appropriately 50 - 74% of the time - May be physically or verbally inappropriate.  Problem Solving Problem Solving: 2-Solves basic 25 - 49% of the time - needs direction more than half the time to initiate, plan or complete simple activities  Memory Memory: 1-Recognizes or recalls less than 25% of the time/requires cueing greater than 75% of the time  Medical Problem List and Plan:  1. Bilateral subdural hematomas after recent/remote fall  2. DVT Prophylaxis/Anticoagulation: SCDs. Monitor for signs of DVT. History of DVT and pulmonary emboli. Coumadin discontinued secondary to subdural hematoma  3. Pain Management: Neurontin 600 mg twice a day, Tylenol as needed. Monitor with increased mobility. Patient was taking OxyContin sustained-release 10 mg 3 times a day prior to admission. Plan to resume as/when tolerated and needed. --headaches are better 4. Mood/anxiety. Valium 5 mg every 6 hours as needed--limit as possible given neuro-cognitive deficits  5. Neuropsych: This patient is not capable of making decisions on her own behalf.  6. Seizure prophylaxis. Keppra 500 mg twice a day. Monitor for any signs of seizure  7. 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 Hypothyroidism. Synthroid  9. Hypertension. Lasix 40 mg twice a day, hydrochlorothiazide 25 mg daily. Controlled at present 10. Hypokalemia. Continue supplement and followup labs--continue to replace 11. Leukocytosis--?reactive 12. Hemochromatosis--may be behind her increased hemoglobin---has been elevated it appears chronically.  LOS (Days) 6 A FACE TO FACE EVALUATION WAS PERFORMED  Frances Finley  T 01/16/2013 8:18 AM

## 2013-01-16 NOTE — Progress Notes (Signed)
Physical Therapy Session Note  Patient Details  Name: Loranda Mastel MRN: [id number] Date of Birth: 05/24/1955  Today's Date: 01/16/2013 Time: 1100-1130 Time Calculation (min): 30 min  Short Term Goals: Week 1:  PT Short Term Goal 1 (Week 1): pt will perform bed mobility with S without use of rails.   PT Short Term Goal 2 (Week 1): pt will demo transfers MinA.   PT Short Term Goal 3 (Week 1): pt will amb with RW and MinA 100' in controlled environment.   PT Short Term Goal 4 (Week 1): pt will don/doff prosthesis with only set-up A and verbal cues to initiate task.    Skilled Therapeutic Interventions/Progress Updates:    Supine in bed, minimally resistant to mobility but agreed with min encouragement. Pt did not want to utilize RW, decreased knowledge of situation and why she now has impaired balance and cognition. Reviewed orientation (situation, place, time) pt nods in agreement but then states "I don't know" when asked to recall information. Ambulation x 20' with RW and min assist then pt reported dizziness, BP found to be 127/80. Pt rested then able to ambulate again 80',80' with RW and min/mod assist in mod distracting environment. Less internally distracted today. Receptive aphasia improving with mobility, min improvements with expressive fluent aphasia.   Therapy Documentation Precautions:  Precautions Precautions: Fall Precaution Comments: pt with L Prosthetic leg Restrictions Weight Bearing Restrictions: No Pain:  some c/o HA, premedicated. No c/o Lt. LE pain  See FIM for current functional status  Therapy/Group: Individual Therapy  Wilhemina Bonito 01/16/2013, 11:43 AM

## 2013-01-16 NOTE — Progress Notes (Signed)
Occupational Therapy Session Note  Patient Details  Name: Frances Finley MRN: [id number] Date of Birth: Sep 16, 1955  Today's Date: 01/16/2013 Time:  0800- 0900     Short Term Goals: Week 1:  OT Short Term Goal 1 (Week 1): Pt will bathe with mod verbal/tactile cues and min A.  OT Short Term Goal 2 (Week 1): Pt will complete toilet transfer with min A.  OT Short Term Goal 3 (Week 1): Pt will demonstrate sustaing attention for 1 grooming task with mod verbal cues. OT Short Term Goal 4 (Week 1): Pt will complete tub/shower transfer with min A, min verbal and tactile cues  Skilled Therapeutic Interventions/Progress Updates:    Pt engaged in 1:1 self-care session in ADL apartment focused on activity tolerance, sequencing, following simple commands, sustained attention. Pt verbalized correctly that she needed to toilet, continent of BM at that time  Decreased cues needed to remain focused on conversational topic this session, pt increased use of correct single words to indicate needs/thoughts.  Bathed with mod cues, increased response to verbal cues and minimal need for tactile cues.  Pt able to indicate correct sequence of dressing tasks when prompted and complete with supervision/set-up.  Brushed hair, attending to R side of head first, after being handed the [place] with no verbal cues.  During oral care at sink in room , pt required min-mod cues to recall what task needed to be accomplished and initially find materials needed.  Demonstrated increased awareness/utilization of tools for task.  Pt chose correct month and year out of choice of two each, aware she is not at home but limited awareness of current situation. Pt in w/c seated at nursing station at conclusion of session.     Therapy Documentation Precautions:  Precautions Precautions: Fall Precaution Comments: pt with L Prosthetic leg Restrictions Weight Bearing Restrictions: No    Pain: No pain.   See FIM for current functional  status  Therapy/Group: Individual Therapy  Kandis Ban 01/16/2013, 9:15 AM

## 2013-01-16 NOTE — Progress Notes (Signed)
Speech Language Pathology Daily Session Note  Patient Details  Name: Frances Finley MRN: [id number] Date of Birth: [birth year]  Today's Date: 01/16/2013 Time: 4098-1191 Time Calculation (min): 40 min  Short Term Goals: Week 1: SLP Short Term Goal 1 (Week 1): Pt will sustain attention to a functional task for 60 seconds with Max A multimodal cues for redirection SLP Short Term Goal 2 (Week 1): Pt will answer yes/no questions in regards to biographical information with 50% accuracy with Mod A multimodal cues.  SLP Short Term Goal 3 (Week 1): Pt will follow 1 step commands with Max A multimodal cueing  SLP Short Term Goal 4 (Week 1): Pt will identify familiar items with Max A multimodal cueing  SLP Short Term Goal 5 (Week 1): Pt will self-monitor to decrease length of utternace during verbal responses with Max A multimodal cueing SLP Short Term Goal 6 (Week 1): Pt will demonstrate efficient mastication of regular textures with supervision verbal cues without overt s/s of aspiration.   Skilled Therapeutic Interventions: Treatment focus on cognitive-linguistic goals. SLP facilitated session with supervision verbal cues for 4 step sequencing with picture cards. Pt required Max multimodal cueing for verbal description of pictures at the phrase level.  Pt demonstrated increased receptive language today and followed all commands and answered all basic questions appropriately. Pt also demonstrated increased overall verbal expression with increased length of utterance of content within a specific context/conversation.    FIM:  Comprehension Comprehension Mode: Auditory Comprehension: 4-Understands basic 75 - 89% of the time/requires cueing 10 - 24% of the time Expression Expression Mode: Verbal Expression: 2-Expresses basic 25 - 49% of the time/requires cueing 50 - 75% of the time. Uses single words/gestures. Social Interaction Social Interaction: 3-Interacts appropriately 50 - 74% of the time - May  be physically or verbally inappropriate. Problem Solving Problem Solving: 3-Solves basic 50 - 74% of the time/requires cueing 25 - 49% of the time Memory Memory: 3-Recognizes or recalls 50 - 74% of the time/requires cueing 25 - 49% of the time  Pain Pain Assessment Pain Assessment: No/denies pain Faces Pain Scale: Hurts little more Pain Type: Acute pain Pain Location: Head Pain Descriptors / Indicators: Aching;Headache (behind her eyes) Pain Onset: Progressive Pain Intervention(s): RN made aware;Repositioned;Rest  Therapy/Group: Individual Therapy  Redina Zeller 01/16/2013, 4:00 PM

## 2013-01-16 NOTE — Progress Notes (Signed)
Physical Therapy Session Note  Patient Details  Name: Frances Finley MRN: [id number] Date of Birth: May 07, 1955  Today's Date: 01/16/2013 Time: 1300-1355 Time Calculation (min): 55 min  Short Term Goals: Week 1:  PT Short Term Goal 1 (Week 1): pt will perform bed mobility with S without use of rails.   PT Short Term Goal 2 (Week 1): pt will demo transfers MinA.   PT Short Term Goal 3 (Week 1): pt will amb with RW and MinA 100' in controlled environment.   PT Short Term Goal 4 (Week 1): pt will don/doff prosthesis with only set-up A and verbal cues to initiate task.    Skilled Therapeutic Interventions/Progress Updates:    Progressing well with mobility, expressive aphasia continues to be most problematic however pt is improving with compensation techniques such as gestures and semantic paraphasias.  Ambulation to/from gym today with close min assist, cues for proximity of RW. Functional memory task involving ambulating with RW to find 3 horseshoes and return to original sitting spot, min gesture cues for locating horseshoes. Second activity involved remembering cue to ambulate to stairs, ascend/descend steps, and return to starting point, performed with min assist and min verbal cues in mod distracting environment.  Ambulation with RW all the way across gym in max distracting environment with multiple obstacles, pt required max verbal and gesture cues and became frustrated but was able to ambulate safely to a mat for seated rest. Attempted ambulation without device x 15' with +2 total hand hold assist (mod assist provided by both therapists), pt continues to benefit from use of RW.   Pt able to propel self back to room with only min verbal cues. Has difficulty reading room numbers and names but able to determine room location by memory of room set-up and personal items.   Therapy Documentation Precautions:  Precautions Precautions: Fall Precaution Comments: pt with L Prosthetic  leg Restrictions Weight Bearing Restrictions: No Pain: Pain Assessment Faces Pain Scale: Hurts little more Pain Type: Acute pain Pain Location: Head Pain Descriptors / Indicators: Aching;Headache (behind her eyes) Pain Onset: Progressive Pain Intervention(s): RN made aware;Repositioned;Rest  See FIM for current functional status  Therapy/Group: Individual Therapy  Wilhemina Bonito 01/16/2013, 3:52 PM

## 2013-01-17 ENCOUNTER — Inpatient Hospital Stay (HOSPITAL_COMMUNITY): Payer: Medicare Other | Admitting: Occupational Therapy

## 2013-01-17 ENCOUNTER — Inpatient Hospital Stay (HOSPITAL_COMMUNITY): Payer: Medicare Other | Admitting: Speech Pathology

## 2013-01-17 ENCOUNTER — Encounter (HOSPITAL_COMMUNITY): Payer: Medicare Other | Admitting: Occupational Therapy

## 2013-01-17 ENCOUNTER — Inpatient Hospital Stay (HOSPITAL_COMMUNITY): Payer: Medicare Other | Admitting: Physical Therapy

## 2013-01-17 DIAGNOSIS — S065X9A Traumatic subdural hemorrhage with loss of consciousness of unspecified duration, initial encounter: Secondary | ICD-10-CM

## 2013-01-17 DIAGNOSIS — S88119A Complete traumatic amputation at level between knee and ankle, unspecified lower leg, initial encounter: Secondary | ICD-10-CM

## 2013-01-17 DIAGNOSIS — W19XXXA Unspecified fall, initial encounter: Secondary | ICD-10-CM

## 2013-01-17 NOTE — Progress Notes (Signed)
NUTRITION FOLLOW UP  Intervention:   1.  Supplements; continue Ensure BID 2.  General healthful diet; encourage intake as able.  Encouraged family to bring food from home and assist pt with meal orders due to expressive aphasia.  Nutrition Dx:   Inadequate oral intake, ongoing  Monitor:   1.  Food/Beverage; pt meeting >/=90% estimated needs with tolerance. 2.  Wt/wt change; monitor trends  Assessment:   Pt admitted with confusion and found to have Bil SDH R greater than L with hx of L BKA.   Diet currently Dysphagia 3 with tolerance. PO continues to be 25-50% of meals. Receptive to Ensure at times.  Pt with difficulty expressing needs and wishes.  May need assistance with meal ordering to ensure foods per her preference.  Will follow wt trends   Height: Ht Readings from Last 1 Encounters:  01/05/13 5\' 9"  (1.753 m)    Weight Status:   Wt Readings from Last 1 Encounters:  01/11/13 209 lb (94.802 kg)    Re-estimated needs:  Kcal: 1700-1900 Protein: 95-115g Fluid: 1.7-1.9 L  Skin: intact  Diet Order: Dysphagia 3, thin   Intake/Output Summary (Last 24 hours) at 01/17/13 1134 Last data filed at 01/16/13 1737  Gross per 24 hour  Intake    240 ml  Output      0 ml  Net    240 ml    Last BM: 9/14   Labs:   Recent Labs Lab 01/11/13 0545  NA 127*  K 3.3*  CL 85*  CO2 26  BUN 20  CREATININE 0.99  CALCIUM 10.1  GLUCOSE 118*    CBG (last 3)  No results found for this basename: GLUCAP,  in the last 72 hours  Scheduled Meds: . feeding supplement  237 mL Oral BID BM  . furosemide  40 mg Oral BID  . gabapentin  600 mg Oral BID  . hydrochlorothiazide  25 mg Oral Daily  . levETIRAcetam  500 mg Oral BID  . levothyroxine  100 mcg Oral QAC breakfast  . pantoprazole  40 mg Oral QHS  . potassium chloride  20 mEq Oral BID  . senna-docusate  1 tablet Oral BID  . sulfamethoxazole-trimethoprim  1 tablet Oral Q12H    Continuous Infusions:   Loyce Dys, MS  RD LDN Clinical Inpatient Dietitian Pager: [phone] Weekend/After hours pager: [phone]

## 2013-01-17 NOTE — Progress Notes (Signed)
Speech Language Pathology Daily Session Note  Patient Details  Name: Frances Finley MRN: [id number] Date of Birth: Jul 16, 1955  Today's Date: 01/17/2013 Time: 0900-0955 Time Calculation (min): 55 min  Short Term Goals: Week 1: SLP Short Term Goal 1 (Week 1): Pt will sustain attention to a functional task for 60 seconds with Max A multimodal cues for redirection SLP Short Term Goal 2 (Week 1): Pt will answer yes/no questions in regards to biographical information with 50% accuracy with Mod A multimodal cues.  SLP Short Term Goal 3 (Week 1): Pt will follow 1 step commands with Max A multimodal cueing  SLP Short Term Goal 4 (Week 1): Pt will identify familiar items with Max A multimodal cueing  SLP Short Term Goal 5 (Week 1): Pt will self-monitor to decrease length of utternace during verbal responses with Max A multimodal cueing SLP Short Term Goal 6 (Week 1): Pt will demonstrate efficient mastication of regular textures with supervision verbal cues without overt s/s of aspiration.   Skilled Therapeutic Interventions: Treatment focus on speech and cognitive goals. SLP facilitated session by providing Mod verbal, visual and question cues for oral reading at the word level. Pt completed task with 43% accuracy but demonstrated increased ability to identify individual letters. Pt's overall accuracy increased as she identified and read individual letters within the specific word aloud.  Pt also named functional items from pictures cards with 66% accuracy and Min phonemic and semantic cues. Pt demonstrated increased ability to self-monitor and correct verbal errors during task.  Pt independently requested to use the bathroom and required Min A verbal cues for functional problem solving and safety with self-care tasks.    FIM:  Comprehension Comprehension Mode: Auditory Comprehension: 4-Understands basic 75 - 89% of the time/requires cueing 10 - 24% of the time Expression Expression Mode:  Verbal Expression: 3-Expresses basic 50 - 74% of the time/requires cueing 25 - 50% of the time. Needs to repeat parts of sentences. Social Interaction Social Interaction: 4-Interacts appropriately 75 - 89% of the time - Needs redirection for appropriate language or to initiate interaction. Problem Solving Problem Solving: 3-Solves basic 50 - 74% of the time/requires cueing 25 - 49% of the time Memory Memory: 3-Recognizes or recalls 50 - 74% of the time/requires cueing 25 - 49% of the time  Pain Pain Assessment Faces Pain Scale: Hurts whole lot Pain Type: Acute pain Pain Location: Head Pain Descriptors / Indicators: Headache Pain Onset: Gradual Pain Intervention(s): Medication (See eMAR)  Therapy/Group: Individual Therapy  Keshana Klemz 01/17/2013, 10:16 AM

## 2013-01-17 NOTE — Patient Care Conference (Signed)
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 SDH (subdural hematoma) 01/11/2013  . HEMOCHROMATOSIS 07/31/2008  . ANXIETY 07/31/2008  . 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 SINUSITIS, ACUTE 07/31/2008  . GERD 07/31/2008  . OTHER&UNSPEC NONSPECIFIC IMMUNOLOGICAL FINDINGS 07/31/2008  . HEMOCHROMATOSIS 07/31/2008    Expected Discharge Date: Expected Discharge Date: 01/24/13  Team Members Present: Physician leading conference: Dr. Faith Rogue Social Worker Present: Amada Jupiter, LCSW Nurse Present: Other (comment) (Melissa Ramgeet, RN) PT Present: Zerita Boers, PT OT Present: Mackie Pai, OT;Jennifer Ivor Messier, OT SLP Present: Feliberto Gottron, SLP PPS Coordinator present : Edson Snowball, PT     Current Status/Progress Goal Weekly Team Focus  Medical   bilateral [facility]. still distracted. aphasia improving  pain control an issue but stable  improve activity tolerance, language, pain control   Bowel/Bladder   Contient of bowel and bladder  Remain continent of bowel and bladder  Assess tolieting needs q2 hours   Swallow/Nutrition/ Hydration   Dys. 3 textures with thin liquids, supervision  supervision  trials of regular textures    ADL's   supervision/set-up - min A with ADL's  supervision with ADL's  functional transfers with RW, day to day recall, sustained attention     Mobility   supervision with transfers and gait, increased to min A with increased difficulty  Supervision assist with mobility  ambulation, NMR, endurance, strengthening, balance and functional mobility.    Communication   Min A for comprehension and Mod A for verbal expression,   Min A  self-monitoring and correcting verbal errors    Safety/Cognition/ Behavioral Observations  Mod A  Min A  problem solving, awareness, safety    Pain   Patient complasins of headache, controled with oxycodone 5mg  q6  pain level < or =3  Monitor for signs and symptoms of pain and treat prn   Skin   no skin issues  remain free from skin breakdown/infection       Rehab Goals Patient on target to meet rehab goals: Yes *See Care Plan and progress notes for long and short-term goals.  Barriers to Discharge: language cognitive deficits    Possible Resolutions to Barriers:  pain    Discharge Planning/Teaching Needs:  home with family to share in providing 24/7 assistance      Team Discussion:  Significant aphasic continues but making good gains - more content within context noted.  Using walker more with ADLs.  Balance can quickly worsen in distracting environments.  Goals supervision overall except may need slightly more assistance in community/ distracting areas.  Revisions to Treatment Plan:  None   Continued Need for Acute Rehabilitation Level of Care: The patient requires daily medical management by a physician with specialized training in physical medicine and rehabilitation for the following conditions: Daily direction of a multidisciplinary physical rehabilitation program to ensure safe treatment while eliciting the  highest outcome that is of practical value to the patient.: Yes Daily medical management of patient stability for increased activity during participation in an intensive rehabilitation regime.: Yes Daily analysis of laboratory values and/or radiology reports with any subsequent need  for medication adjustment of medical intervention for : Neurological problems;Other  Frances Finley 01/17/2013, 3:28 PM

## 2013-01-17 NOTE — Progress Notes (Signed)
Occupational Therapy Session Note  Patient Details  Name: Frances Finley MRN: [id number] Date of Birth: 05-Aug-1955  Today's Date: 01/17/2013 Time: 0800-0900 Time Calculation (min): 60 min  Short Term Goals: Week 1:  OT Short Term Goal 1 (Week 1): Pt will bathe with mod verbal/tactile cues and min A.  OT Short Term Goal 2 (Week 1): Pt will complete toilet transfer with min A.  OT Short Term Goal 3 (Week 1): Pt will demonstrate sustaing attention for 1 grooming task with mod verbal cues. OT Short Term Goal 4 (Week 1): Pt will complete tub/shower transfer with min A, min verbal and tactile cues  Skilled Therapeutic Interventions/Progress Updates:    1:1 self care retraining. Pt in bed eating breakfast when arrived. Pt requested to go to the bathroom with subtle prompt about trying to void. Focused on donning prosthesis with setup; functional safe ambulation with RW to and from bathroom with steady A as she negotiates thresholds and doorways, toileting with steady A. Pt reported with clear effective language she showered yesterday and didn't need to shower everyday. Agreed to shower tomorrow, just wanting to perform grooming and dressing. All clothing positioned on right side to address right side inattention. Pt only required one cue for sequencing with dressing (for remembering to don underwear. Functional ambulation around room to put dirty clothes in drawer. Returned to sitting EOB to finish breakfast. Pt could recall from earlier in the session that she had a HA due to "bleed in my head." asked appropriate questions about home and insight about "doing this" (therapy) at home. Pt oriented to hospital (no name) and appropriately looked to calendar for date but difficulty with verbal expression of reading of date.   Therapy Documentation Precautions:  Precautions Precautions: Fall Precaution Comments: pt with L Prosthetic leg Restrictions Weight Bearing Restrictions: No Pain: Pain  Assessment Faces Pain Scale: Hurts a little bit Pain Location: Head Pain Descriptors / Indicators: Headache Pain Intervention(s): Medication (See eMAR)  See FIM for current functional status  Therapy/Group: Individual Therapy  Roney Mans [facility] 01/17/2013, 9:08 AM

## 2013-01-17 NOTE — Progress Notes (Signed)
Subjective/Complaints: Having pain in LLQ. A 12 point review of systems has been performed and if not noted above is otherwise negative.   Objective: Vital Signs: Blood pressure 111/60, pulse 96, temperature 98.1 F (36.7 C), temperature source Oral, resp. rate 18, weight 94.802 kg (209 lb), SpO2 98.00%. No results found. No results found for this basename: WBC, HGB, HCT, PLT,  in the last 72 hours No results found for this basename: NA, K, CL, CO, GLUCOSE, BUN, CREATININE, CALCIUM,  in the last 72 hours CBG (last 3)  No results found for this basename: GLUCAP,  in the last 72 hours  Wt Readings from Last 3 Encounters:  01/11/13 94.802 kg (209 lb)  01/09/13 95 kg (209 lb 7 oz)    Physical Exam:  Gen: pt is difficult to arouse  HENT:  Head: Normocephalic.  Eyes: EOM are normal.  No nystagmus  Neck: Normal range of motion. Neck supple. No thyromegaly present.  Cardiovascular: Normal rate and regular rhythm. No murmurs  Pulmonary/Chest: Effort normal and breath sounds normal. No respiratory distress. No wheezes  Abdominal: Soft. Bowel sounds are normal. She exhibits no distension. Mild pain with deep palpation LLQ. No rebound  Musculoskeletal:  Left BKA. Limb well-formed, non-tender Neurological:  Moved all 4's without focal weakness.  Sensed pain in all 4's. Could answer simple questions. Better comprehension. Speaking in short sentences and phrases with better content. Still substitutes words frequently.  Skin: Skin is warm and dry.  Psychiatric:  alert   Assessment/Plan: 1. Functional deficits secondary to bilateral [facility] which require 3+ hours per day of interdisciplinary therapy in a comprehensive inpatient rehab setting. Physiatrist is providing close team supervision and 24 hour management of active medical problems listed below. Physiatrist and rehab team continue to assess barriers to discharge/monitor patient progress toward functional and medical goals. FIM: FIM -  Bathing Bathing Steps Patient Completed: Chest;Abdomen;Right upper leg;Left lower leg (including foot);Left upper leg;Front perineal area;Right Arm;Left Arm;Buttocks;Right lower leg (including foot) Bathing: 5: Set-up assist to: Obtain items  FIM - Upper Body Dressing/Undressing Upper body dressing/undressing steps patient completed: Thread/unthread right sleeve of pullover shirt/dresss;Thread/unthread left sleeve of pullover shirt/dress;Put head through opening of pull over shirt/dress;Pull shirt over trunk Upper body dressing/undressing: 5: Set-up assist to: Obtain clothing/put away FIM - Lower Body Dressing/Undressing Lower body dressing/undressing steps patient completed: Thread/unthread right underwear leg;Thread/unthread left underwear leg;Pull underwear up/down;Thread/unthread right pants leg;Thread/unthread left pants leg;Pull pants up/down;Don/Doff right sock;Don/Doff right shoe Lower body dressing/undressing: 4: Steadying Assist  FIM - Toileting Toileting steps completed by patient: Adjust clothing prior to toileting;Performs perineal hygiene;Adjust clothing after toileting Toileting Assistive Devices: Grab bar or rail for support Toileting: 4: Steadying assist  FIM - Diplomatic Services operational officer Devices: Grab bars Toilet Transfers: 4-From toilet/BSC: Min A (steadying Pt. > 75%);4-To toilet/BSC: Min A (steadying Pt. > 75%)  FIM - Bed/Chair Transfer Bed/Chair Transfer Assistive Devices: Bed rails;HOB elevated Bed/Chair Transfer: 5: Supine > Sit: Supervision (verbal cues/safety issues);5: Sit > Supine: Supervision (verbal cues/safety issues);4: Bed > Chair or W/C: Min A (steadying Pt. > 75%);4: Chair or W/C > Bed: Min A (steadying Pt. > 75%)  FIM - Locomotion: Wheelchair Locomotion: Wheelchair: 0: Activity did not occur FIM - Locomotion: Ambulation Locomotion: Ambulation Assistive Devices: Designer, industrial/product Ambulation/Gait Assistance: 4: Min assist Locomotion:  Ambulation: 4: Travels 150 ft or more with minimal assistance (Pt.>75%)  Comprehension Comprehension Mode: Auditory Comprehension: 4-Understands basic 75 - 89% of the time/requires cueing 10 - 24% of the  time  Expression Expression Mode: Verbal Expression: 2-Expresses basic 25 - 49% of the time/requires cueing 50 - 75% of the time. Uses single words/gestures.  Social Interaction Social Interaction: 3-Interacts appropriately 50 - 74% of the time - May be physically or verbally inappropriate.  Problem Solving Problem Solving: 3-Solves basic 50 - 74% of the time/requires cueing 25 - 49% of the time  Memory Memory: 3-Recognizes or recalls 50 - 74% of the time/requires cueing 25 - 49% of the time  Medical Problem List and Plan:  1. Bilateral subdural hematomas after recent/remote fall  2. DVT Prophylaxis/Anticoagulation: SCDs. Monitor for signs of DVT. History of DVT and pulmonary emboli. Coumadin discontinued secondary to subdural hematoma  3. Pain Management: Neurontin 600 mg twice a day, Tylenol as needed. Monitor with increased mobility. Patient was taking OxyContin sustained-release 10 mg 3 times a day prior to admission. Had some abdominal pain PTA--still doesn't appear that severe at this time. Observe for now. 4. Mood/anxiety. Valium 5 mg every 6 hours as needed--limit as possible given neuro-cognitive deficits  5. Neuropsych: This patient is not capable of making decisions on her own behalf.  6. Seizure prophylaxis. Keppra 500 mg twice a day. Monitor for any signs of seizure  7. 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 Hypothyroidism. Synthroid  9. Hypertension. Lasix 40 mg twice a day, hydrochlorothiazide 25 mg daily. Controlled at present 10. Hypokalemia. Continue supplement and followup labs--continue to replace 11. Leukocytosis--?reactive 12. Hemochromatosis--may be behind her increased hemoglobin---has been elevated it appears chronically.  LOS (Days) 7 A  FACE TO FACE EVALUATION WAS PERFORMED  SWARTZ,ZACHARY T 01/17/2013 8:23 AM

## 2013-01-17 NOTE — Progress Notes (Signed)
Physical Therapy Session Note  Patient Details  Name: Frances Finley MRN: [id number] Date of Birth: 1955/12/22  Today's Date: 01/17/2013 Time: 1000-1100 Time Calculation (min): 60 min  Short Term Goals: Week 1:  PT Short Term Goal 1 (Week 1): pt will perform bed mobility with S without use of rails.   PT Short Term Goal 2 (Week 1): pt will demo transfers MinA.   PT Short Term Goal 3 (Week 1): pt will amb with RW and MinA 100' in controlled environment.   PT Short Term Goal 4 (Week 1): pt will don/doff prosthesis with only set-up A and verbal cues to initiate task.    Skilled Therapeutic Interventions/Progress Updates:    Pt received supine in bed, displayed slight confusion and expressive aphasia at initiation of treatment session, slightly improved through treatment with increase with increased distraction and more complex commands to perform tasks. Pt performed gait training with RW with supervision to min A to and from room and in rehab gym, requires increased assistance with increased distraction and dynamic gait activities. Retrieving and identifying of items with use of RW with pt displaying difficulty identifying colors accurately, once corrected able to recall 2 out of 4 trials. Dynamic balance and reaching activities with horseshoes, ball toss and toe tapping with colors and numbers and 2-3 step sequencing with pt requiring max vcs to complete. Transfer training to various surfaces with supervision A.    Therapy Documentation Precautions:  Precautions Precautions: Fall Precaution Comments: pt with L Prosthetic leg Restrictions Weight Bearing Restrictions: No    Vital Signs: BP: 119/83 HR: 100 bpm   Pain: Pain Assessment Faces Pain Scale: No hurt Pain Type: Acute pain Pain Location: Head Pain Descriptors / Indicators: Headache Pain Onset: Gradual Pain Intervention(s): Medication (See eMAR)    Locomotion : Ambulation Ambulation/Gait Assistance: 4: Min assist                 See FIM for current functional status  Therapy/Group: Individual Therapy  Jackelyn Knife 01/17/2013, 12:57 PM

## 2013-01-17 NOTE — Progress Notes (Signed)
Occupational Therapy Session Note  Patient Details  Name: Frances Finley MRN: [id number] Date of Birth: 09-21-1955  Today's Date: 01/17/2013 Time: 1400-1430 Time Calculation: 30 minutes.    Short Term Goals: Week 1:  OT Short Term Goal 1 (Week 1): Pt will bathe with mod verbal/tactile cues and min A.  OT Short Term Goal 2 (Week 1): Pt will complete toilet transfer with min A.  OT Short Term Goal 3 (Week 1): Pt will demonstrate sustaing attention for 1 grooming task with mod verbal cues. OT Short Term Goal 4 (Week 1): Pt will complete tub/shower transfer with min A, min verbal and tactile cues  Skilled Therapeutic Interventions/Progress Updates:    Pt engaged in 1:1 focused on sustained attention, sequencing, following one-step commands, functional mobility.  Pt asleep upon arrival to room, awakened easily and requested to toilet.  Utilized RW for toilet transfer and to ambulate to laundry room, no resistance displayed to using RW for tasks.  Sat in chair to complete sequence of steps to do laundry.  Required mod verbal cues to understand desired task and necessary tools.  Sustained attention noted throughout task, even with min-mod distracting environment.  As task progressed, amount of cueing needed decreased as pt became more familiar with demands/sequence.  Ambulated back to room with no verbal cues to find room.  Pt tearful upon conclusion of session, feels she is ready to go home.  Demonstrated no carryover during session when oriented to situation.    Therapy Documentation Precautions:  Precautions Precautions: Fall Precaution Comments: pt with L Prosthetic leg Restrictions Weight Bearing Restrictions: No    Pain: None.   See FIM for current functional status  Therapy/Group: Individual Therapy  Kandis Ban 01/17/2013, 2:40 PM

## 2013-01-18 ENCOUNTER — Inpatient Hospital Stay (HOSPITAL_COMMUNITY): Payer: Medicare Other | Admitting: Physical Therapy

## 2013-01-18 ENCOUNTER — Inpatient Hospital Stay (HOSPITAL_COMMUNITY): Payer: Medicare Other | Admitting: Occupational Therapy

## 2013-01-18 ENCOUNTER — Inpatient Hospital Stay (HOSPITAL_COMMUNITY): Payer: Medicare Other

## 2013-01-18 ENCOUNTER — Inpatient Hospital Stay (HOSPITAL_COMMUNITY): Payer: Medicare Other | Admitting: Speech Pathology

## 2013-01-18 ENCOUNTER — Encounter (HOSPITAL_COMMUNITY): Payer: Medicare Other | Admitting: Occupational Therapy

## 2013-01-18 NOTE — Progress Notes (Signed)
This note has been reviewed and this clinician agrees with information provided.  

## 2013-01-18 NOTE — Progress Notes (Signed)
Occupational Therapy Weekly/Daily Progress Notes  Patient Details  Name: Frances Finley MRN: [id number] Date of Birth: 1955/12/16  Today's Date: 01/18/2013    Patient has met 4 of 4 short term goals.  Pt is currently supervision to min A with BADL's. Pt continues to demonstrate decreased ideational apraxia during ADL sessions, increased sustained attention, and decreased multimodal cues to engage in familiar activities.  Family education not yet completed.  Pts activity tolerance is improving, less resistance to using RW for functional mobility.  Continues to exhibit poor safety awareness and has difficulty grasping concept/extent of injury.  Intermittent complaints of headaches. Pt has increased functional communication to convey basic needs/wants to staff.  Continues to have difficulty finding correct words at times.  Patient continues to demonstrate the following deficits:decreased initiation, decreased attention, decreased awareness, decreased problem solving, decreased safety awareness and decreased memory, impaired balance, decreased activity tolerance and therefore will continue to benefit from skilled OT intervention to enhance overall performance with BADL.  Patient progressing toward long term goals..  Continue plan of care.  OT Short Term Goals Week 1:  OT Short Term Goal 1 (Week 1): Pt will bathe with mod verbal/tactile cues and min A.  OT Short Term Goal 1 - Progress (Week 1): Met OT Short Term Goal 2 (Week 1): Pt will complete toilet transfer with min A.  OT Short Term Goal 2 - Progress (Week 1): Met OT Short Term Goal 3 (Week 1): Pt will demonstrate sustaing attention for 1 grooming task with mod verbal cues. OT Short Term Goal 3 - Progress (Week 1): Met OT Short Term Goal 4 (Week 1): Pt will complete tub/shower transfer with min A, min verbal and tactile cues OT Short Term Goal 4 - Progress (Week 1): Met  Skilled Therapeutic Interventions/Progress Updates:     First  Session: Individual, time: 0900-1000 (60 minutes) Pt engaged in 1:1 self-care retraining session with focus on sequencing, functional problem-solving, sustained attention. Pt bathed in ADL apartment tub/shower, ambulated with RW to complete tasks.  Min verbal cueing for bathing tasks, pt demonstrated increased sequencing abilities.  Dressed from tub bench, demonstrated awareness of error and simple problem-solving with dressing sequence.  Pt completed oral care at sink in room with one instructional cue (completed task from start to finish with no further cuing).  Vision reassessed at bedside, no noted visual impairments either eye with confrontation testing, tracking, peripheral vision all fields. Continued to demonstrate minimal r-side inattention during session.  Family present in room, pt in bed at conclusion of session. Pt stated she had no headache today.    Second Session: Individual, time: 1430-1500 Pt engaged in session focused on activity tolerance, sequencing simple activity, functional problem solving, day to day recall of events.  Pt self-propelled w/c to ADL kitchen. Chose food item to prepare out of field of three.  With mod verbal cues, pt able to choose materials needed for task and execute in appropriate sequence.  Able to scan environment for identified objects and use objects accurately 100% of the time. Demonstrated sustained attention throughout entire session.    Therapy Documentation Precautions:  Precautions Precautions: Fall Precaution Comments: pt with L Prosthetic leg Restrictions Weight Bearing Restrictions: No    Pain:  None both sessions.   See FIM for current functional status  Therapy/Group: Individual Therapy  Kandis Ban 01/18/2013, 10:01 AM

## 2013-01-18 NOTE — Progress Notes (Signed)
Subjective/Complaints: No pain today. Appears comfortable A 12 point review of systems has been performed and if not noted above is otherwise negative.   Objective: Vital Signs: Blood pressure 112/79, pulse 97, temperature 97.7 F (36.5 C), temperature source Oral, resp. rate 18, weight 91.672 kg (202 lb 1.6 oz), SpO2 95.00%. No results found. No results found for this basename: WBC, HGB, HCT, PLT,  in the last 72 hours No results found for this basename: NA, K, CL, CO, GLUCOSE, BUN, CREATININE, CALCIUM,  in the last 72 hours CBG (last 3)  No results found for this basename: GLUCAP,  in the last 72 hours  Wt Readings from Last 3 Encounters:  01/17/13 91.672 kg (202 lb 1.6 oz)  01/09/13 95 kg (209 lb 7 oz)    Physical Exam:  Gen: pt is difficult to arouse  HENT:  Head: Normocephalic.  Eyes: EOM are normal.  No nystagmus  Neck: Normal range of motion. Neck supple. No thyromegaly present.  Cardiovascular: Normal rate and regular rhythm. No murmurs  Pulmonary/Chest: Effort normal and breath sounds normal. No respiratory distress. No wheezes  Abdominal: Soft. Bowel sounds are normal. She exhibits no distension. Mild pain with deep palpation LLQ. No rebound  Musculoskeletal:  Left BKA. Limb well-formed, non-tender Neurological:  Moved all 4's without focal weakness.  Sensed pain in all 4's. Could answer simple questions. Better comprehension. Speaking in short sentences and phrases with better content. Still substitutes words frequently.  Skin: Skin is warm and dry.  Psychiatric:  alert   Assessment/Plan: 1. Functional deficits secondary to bilateral [facility] which require 3+ hours per day of interdisciplinary therapy in a comprehensive inpatient rehab setting. Physiatrist is providing close team supervision and 24 hour management of active medical problems listed below. Physiatrist and rehab team continue to assess barriers to discharge/monitor patient progress toward functional and  medical goals. FIM: FIM - Bathing Bathing Steps Patient Completed: Chest;Abdomen;Right upper leg;Left lower leg (including foot);Left upper leg;Front perineal area;Right Arm;Left Arm;Buttocks;Right lower leg (including foot) Bathing: 5: Set-up assist to: Obtain items  FIM - Upper Body Dressing/Undressing Upper body dressing/undressing steps patient completed: Thread/unthread right sleeve of pullover shirt/dresss;Thread/unthread left sleeve of pullover shirt/dress;Put head through opening of pull over shirt/dress;Pull shirt over trunk Upper body dressing/undressing: 5: Set-up assist to: Obtain clothing/put away FIM - Lower Body Dressing/Undressing Lower body dressing/undressing steps patient completed: Thread/unthread right underwear leg;Thread/unthread left underwear leg;Pull underwear up/down;Thread/unthread right pants leg;Thread/unthread left pants leg;Pull pants up/down;Don/Doff right sock;Don/Doff right shoe Lower body dressing/undressing: 4: Steadying Assist  FIM - Toileting Toileting steps completed by patient: Adjust clothing prior to toileting;Performs perineal hygiene;Adjust clothing after toileting Toileting Assistive Devices: Grab bar or rail for support Toileting: 4: Steadying assist  FIM - Diplomatic Services operational officer Devices: Grab bars Toilet Transfers: 4-From toilet/BSC: Min A (steadying Pt. > 75%);4-To toilet/BSC: Min A (steadying Pt. > 75%)  FIM - Bed/Chair Transfer Bed/Chair Transfer Assistive Devices: Bed rails Bed/Chair Transfer: 5: Supine > Sit: Supervision (verbal cues/safety issues);5: Sit > Supine: Supervision (verbal cues/safety issues);4: Bed > Chair or W/C: Min A (steadying Pt. > 75%);4: Chair or W/C > Bed: Min A (steadying Pt. > 75%)  FIM - Locomotion: Wheelchair Locomotion: Wheelchair: 0: Activity did not occur FIM - Locomotion: Ambulation Locomotion: Ambulation Assistive Devices: Designer, industrial/product Ambulation/Gait Assistance: 4: Min  assist Locomotion: Ambulation: 4: Travels 150 ft or more with minimal assistance (Pt.>75%)  Comprehension Comprehension Mode: Auditory Comprehension: 4-Understands basic 75 - 89% of the time/requires cueing 10 -  24% of the time  Expression Expression Mode: Verbal Expression: 3-Expresses basic 50 - 74% of the time/requires cueing 25 - 50% of the time. Needs to repeat parts of sentences.  Social Interaction Social Interaction: 4-Interacts appropriately 75 - 89% of the time - Needs redirection for appropriate language or to initiate interaction.  Problem Solving Problem Solving: 3-Solves basic 50 - 74% of the time/requires cueing 25 - 49% of the time  Memory Memory: 3-Recognizes or recalls 50 - 74% of the time/requires cueing 25 - 49% of the time  Medical Problem List and Plan:  1. Bilateral subdural hematomas after recent/remote fall  2. DVT Prophylaxis/Anticoagulation: SCDs. Monitor for signs of DVT. History of DVT and pulmonary emboli. Coumadin discontinued secondary to subdural hematoma  3. Pain Management: Neurontin 600 mg twice a day, Tylenol as needed. Monitor with increased mobility. Patient was taking OxyContin sustained-release 10 mg 3 times a day prior to admission. Had some abdominal pain PTA--still doesn't appear that severe at this time. Observe for now. 4. Mood/anxiety. Valium 5 mg every 6 hours as needed--limit as possible given neuro-cognitive deficits  5. Neuropsych: This patient is not capable of making decisions on her own behalf.  6. Seizure prophylaxis. Keppra 500 mg twice a day. Monitor for any signs of seizure  7. 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 Hypothyroidism. Synthroid  9. Hypertension. Lasix 40 mg twice a day, hydrochlorothiazide 25 mg daily. Controlled at present 10. Hypokalemia. Continue supplement and followup labs--continue to replace 11. Leukocytosis--?reactive--afebrile, no signs of infection 12. Hemochromatosis--may be behind her  increased hemoglobin---has been elevated it appears chronically.  LOS (Days) 8 A FACE TO FACE EVALUATION WAS PERFORMED  Samir Ishaq T 01/18/2013 8:19 AM

## 2013-01-18 NOTE — Progress Notes (Signed)
Social Work Patient ID: Frances Finley, female   DOB: [birth year], 56 y.o.   MRN: [id number]  Met with patient and husband today to review team conference.  Both aware and agreeable with targeted d/c date of 9/23 with supervision goals overall.  Husband reports that he and pt's daughter plan to share in providing this care.  Pt will improved speech and ability communicate more clearly.  Husband pleased with progress.  Continue to follow for d/c planning and family education scheduling.  Amanada Philbrick, LCSW

## 2013-01-18 NOTE — Progress Notes (Signed)
Speech Language Pathology Session & Weekly Progress Notes  Patient Details  Name: Frances Finley MRN: [id number] Date of Birth: 1956/02/20  Today's Date: 01/18/2013 Time: 1400-1430 Time Calculation (min): 30 min  Short Term Goals: Week 1: SLP Short Term Goal 1 (Week 1): Pt will sustain attention to a functional task for 60 seconds with Max A multimodal cues for redirection SLP Short Term Goal 1 - Progress (Week 1): Met SLP Short Term Goal 2 (Week 1): Pt will answer yes/no questions in regards to biographical information with 50% accuracy with Mod A multimodal cues.  SLP Short Term Goal 2 - Progress (Week 1): Met SLP Short Term Goal 3 (Week 1): Pt will follow 1 step commands with Max A multimodal cueing  SLP Short Term Goal 3 - Progress (Week 1): Met SLP Short Term Goal 4 (Week 1): Pt will identify familiar items with Max A multimodal cueing  SLP Short Term Goal 4 - Progress (Week 1): Met SLP Short Term Goal 5 (Week 1): Pt will self-monitor to decrease length of utternace during verbal responses with Max A multimodal cueing SLP Short Term Goal 5 - Progress (Week 1): Met SLP Short Term Goal 6 (Week 1): Pt will demonstrate efficient mastication of regular textures with supervision verbal cues without overt s/s of aspiration.  SLP Short Term Goal 6 - Progress (Week 1): Met  New Short Term Goals:  Week 2: SLP Short Term Goal 1 (Week 2): Pt will demonstrate reading comprehension at the word level with 75% accuracy with Mod multimodal cueing  SLP Short Term Goal 2 (Week 2): Pt will name functional items with Min A multimodal cueing  SLP Short Term Goal 3 (Week 2): Pt will self-monitor and correct errors with Mod A multimodal cueing  SLP Short Term Goal 4 (Week 2): Pt will utilize call bell to express wants/needs with Mod A multimodal cueing  SLP Short Term Goal 5 (Week 2): Pt will demonstrate selective attention to a familiar task for 30 minutes with supervision verbal cues.  SLP Short Term  Goal 6 (Week 2): Pt will follow 2 step commands with Mod  A multimodal cueing   Weekly Progress Updates: Pt has made excellent progress and has met 6 of 6 STG's this reporting period.  Pt has been upgraded to regular textures with thin liquids without overt s/s of aspiration. Pt demonstrates improved sustained attention, emergent awareness of verbal errors and problem solving with functional tasks. Pt requires Mod-Max A cues for recall of newly learned information and cues for utilization of external memory aids. Pt demonstrates increased verbal content/information within a functional conversation and context and increased ability to name functional items and for reading comprehension at the word level. Pt would benefit from continued skilled SLP intervention to maximize cognitive-linguistic function and overall functional independence.    SLP Intensity: Minumum of 1-2 x/day, 30 to 90 minutes SLP Frequency: 5 out of 7 days SLP Duration/Estimated Length of Stay: 1 week SLP Treatment/Interventions: Cueing hierarchy;Cognitive remediation/compensation;Environmental controls;Internal/external aids;Speech/Language facilitation;Patient/family education;Therapeutic Activities;Functional tasks;Multimodal communication approach;Dysphagia/aspiration precaution training SLP Eating/Swallowing Anticipated Outcome(s): supervision with regular textures  SLP Communication Anticipated Outcome(s): Min A with multimodal communication SLP Cognition Anticipated Outcome(s): Min A  Daily Session Skilled Therapeutic Intervention: Treatment focus on dysphagia and cognitive-linguistic goals. SLP facilitated session by providing trials of regular textures. Pt demonstrated efficient mastication without overt s/s of aspiration. Recommend pt upgrade to regular textures. SLP also facilitated session by providing Mod question and visual cues for orientation to time, place  and situation and to self-monitor and correct verbal errors  within a functional conversation. Pt demonstrated increased specific content within a given context throughout the session. Clinician was able to understand ~75% of the content throughout the functional conversation.  FIM:  Comprehension Comprehension Mode: Auditory Comprehension: 4-Understands basic 75 - 89% of the time/requires cueing 10 - 24% of the time Expression Expression Mode: Verbal Expression: 3-Expresses basic 50 - 74% of the time/requires cueing 25 - 50% of the time. Needs to repeat parts of sentences. Social Interaction Social Interaction: 4-Interacts appropriately 75 - 89% of the time - Needs redirection for appropriate language or to initiate interaction. Problem Solving Problem Solving: 3-Solves basic 50 - 74% of the time/requires cueing 25 - 49% of the time Memory Memory: 3-Recognizes or recalls 50 - 74% of the time/requires cueing 25 - 49% of the time FIM - Eating Eating Activity: 6: Swallowing techniques: self-managed  Pain "Sore all over"  Therapy/Group: Individual Therapy  Moshe Wenger 01/18/2013, 2:50 PM

## 2013-01-18 NOTE — Progress Notes (Signed)
Speech Language Pathology Daily Session Note  Patient Details  Name: Frances Finley MRN: [id number] Date of Birth: 1956/04/07  Today's Date: 01/18/2013 Time: 1030-1100 Time Calculation (min): 30 min  Short Term Goals: Week 2: SLP Short Term Goal 1 (Week 2): Pt will demonstrate reading comprehension at the word level with 75% accuracy with Mod multimodal cueing  SLP Short Term Goal 2 (Week 2): Pt will name functional items with Min A multimodal cueing  SLP Short Term Goal 3 (Week 2): Pt will self-monitor and correct errors with Mod A multimodal cueing  SLP Short Term Goal 4 (Week 2): Pt will utilize call bell to express wants/needs with Mod A multimodal cueing  SLP Short Term Goal 5 (Week 2): Pt will demonstrate selective attention to a familiar task for 30 minutes with supervision verbal cues.  SLP Short Term Goal 6 (Week 2): Pt will follow 2 step commands with Mod  A multimodal cueing   Skilled Therapeutic Interventions: Treatment focused on speech and swallowing goals. SLP facilitated with skilled observation of advanced solid textures. Pt consumed peanut butter crackers and thin liquids with no overt s/s of aspiration and oral preparation time [facility]. Pt participated in confrontational naming task with Max phonemic and sentence completion cues. SLP provided education to husband and father regarding strategies for expressive language. Continue plan of care.   FIM:  Comprehension Comprehension Mode: Auditory Comprehension: 4-Understands basic 75 - 89% of the time/requires cueing 10 - 24% of the time Expression Expression Mode: Verbal Expression: 3-Expresses basic 50 - 74% of the time/requires cueing 25 - 50% of the time. Needs to repeat parts of sentences. Social Interaction Social Interaction: 4-Interacts appropriately 75 - 89% of the time - Needs redirection for appropriate language or to initiate interaction. Problem Solving Problem Solving: 3-Solves basic 50 - 74% of the  time/requires cueing 25 - 49% of the time Memory Memory: 3-Recognizes or recalls 50 - 74% of the time/requires cueing 25 - 49% of the time FIM - Eating Eating Activity: 5: Supervision/cues;5: Set-up assist for open containers  Pain  No/denies pain  Therapy/Group: Individual Therapy  Maxcine Ham 01/18/2013, 2:46 PM  Maxcine Ham, M.A. CCC-SLP [phone]

## 2013-01-18 NOTE — Progress Notes (Signed)
Physical Therapy Note  Patient Details  Name: Brisia Schuermann MRN: [id number] Date of Birth: 08/21/1955 Today's Date: 01/18/2013  1100-1155 (55 minutes ) individual Pain : Pt reports tightness LT BKA prosthesis / increased redness posterior knee Focus of treatment : Therapeutic activities focused on short term memory/ recall / standing balance without AD ; gait training/ activity tolerance Treatment: Pt in bed upon arrival; oriented to person only ; vcs for month, year, place, situation with difficulty with word finding or classifying or recalling letters, associating words with object (using ball with letters to recall letter, identifying foods beginning with appropriate letter; max vcs to recall personal pets names; standing ball toss min assist counting to 30 without cues; gait to/from room 120 feet min to SBA with left prosthesis/ RW. Pt able to locate room with minimal cues.   Shawnell Dykes,JIM 01/18/2013, 11:52 AM

## 2013-01-19 ENCOUNTER — Encounter (HOSPITAL_COMMUNITY): Payer: Medicare Other | Admitting: Occupational Therapy

## 2013-01-19 ENCOUNTER — Inpatient Hospital Stay (HOSPITAL_COMMUNITY): Payer: Medicare Other | Admitting: Physical Therapy

## 2013-01-19 ENCOUNTER — Inpatient Hospital Stay (HOSPITAL_COMMUNITY): Payer: Medicare Other | Admitting: Speech Pathology

## 2013-01-19 ENCOUNTER — Inpatient Hospital Stay (HOSPITAL_COMMUNITY): Payer: Medicare Other | Admitting: Occupational Therapy

## 2013-01-19 MED ORDER — OXYCODONE HCL 5 MG PO TABS
5.0000 mg | ORAL_TABLET | Freq: Once | ORAL | Status: AC
  Administered 2013-01-19: 5 mg via ORAL
  Filled 2013-01-19: qty 1

## 2013-01-19 MED ORDER — OXYCODONE HCL 5 MG PO TABS
5.0000 mg | ORAL_TABLET | ORAL | Status: DC | PRN
  Administered 2013-01-19 – 2013-01-23 (×14): 10 mg via ORAL
  Filled 2013-01-19 (×15): qty 2

## 2013-01-19 NOTE — Progress Notes (Signed)
Occupational Therapy Session Note  Patient Details  Name: Frances Finley MRN: [id number] Date of Birth: 1955/12/04  Today's Date: 01/19/2013 Time: 1330-1400 Time Calculation (min): 30 min   Skilled Therapeutic Interventions/Progress Updates:    1:1 When arrived in pt's room pt requested to go and get her laundry she had started in the laundry room (pt didn't require a prompt to remember where her clothes where). Pt ambulated with more distant supervision to the laundry room, retrieved laundry from dryer, rode back her to room in w/c, folded them and put them away in the appropriate drawers. Pt then participated in making and reading a sign for a visual cue of the place she is "hospital," reason she is here "blood in the head," and her discharge date. Pt making great gains in functional communication: written and verbal. Pt also able to write her name, birthday with min cuing, husband and dog's name, and today's date without looking at the calendar.  Therapy Documentation Precautions:  Precautions Precautions: Fall Precaution Comments: pt with L Prosthetic leg Restrictions Weight Bearing Restrictions: No Pain: No reports of pain  See FIM for current functional status  Therapy/Group: Individual Therapy  Roney Mans [facility] 01/19/2013, 2:46 PM

## 2013-01-19 NOTE — Progress Notes (Signed)
Occupational Therapy Session Note  Patient Details  Name: Delight Bickle MRN: [id number] Date of Birth: 07/18/1955  Today's Date: 01/19/2013 Time:  0800- 0900  Time Calculation: 60 minutes   Skilled Therapeutic Interventions/Progress Updates:    Pt engaged in 1:1 self-care session focused on attention, working memory, sequencing. Pt gathered clothes from drawer with min verbal cues, chose self-care activity to be completed next out of choice of three.  Groomed seated at sink with min verbal cues for sequencing.  Simple problem solving demonstrated throughout session, i.e. Pt able to adjust water temperature with no verbal cues or extra time to process.  Ambulated short distance with RW with supervision to put clothes in washing machine with min cues, recalled doing the same activity in therapy yesterday.  Pt complained of blurred vision in R eye today.  Pt transferred back to bed at conclusion of session to visit with family.  Pt demonstrated increased awareness of word finding difficulty this session. Demonstrated selective attention throughout tasks completed.   Therapy Documentation Precautions:  Precautions Precautions: Fall Precaution Comments: pt with L Prosthetic leg Restrictions Weight Bearing Restrictions: No Pain: None.    Therapy/Group: Individual Therapy  Kandis Ban 01/19/2013, 11:22 AM

## 2013-01-19 NOTE — Progress Notes (Signed)
Speech Language Pathology Daily Session Note  Patient Details  Name: Frances Finley MRN: [id number] Date of Birth: Apr 03, 1956  Today's Date: 01/19/2013 Time: 6578-4696 Time Calculation (min): 55 min  Short Term Goals: Week 2: SLP Short Term Goal 1 (Week 2): Pt will demonstrate reading comprehension at the word level with 75% accuracy with Mod multimodal cueing  SLP Short Term Goal 2 (Week 2): Pt will name functional items with Min A multimodal cueing  SLP Short Term Goal 3 (Week 2): Pt will self-monitor and correct errors with Mod A multimodal cueing  SLP Short Term Goal 4 (Week 2): Pt will utilize call bell to express wants/needs with Mod A multimodal cueing  SLP Short Term Goal 5 (Week 2): Pt will demonstrate selective attention to a familiar task for 30 minutes with supervision verbal cues.  SLP Short Term Goal 6 (Week 2): Pt will follow 2 step commands with Mod  A multimodal cueing   Skilled Therapeutic Interventions: Treatment focus on cognitive-linguistic goals and family education with pt's husband and father. SLP facilitated session by providing Mod A semantic, phonemic and written cues for naming of functional items and verbalizing similarities from a field of 2. Pt independently requested to use the bathroom and required Min A verbal cues for safety with ambulation.  Pt's husband and father educated on strategies to increase word-finding and improve working memory and overall functional problem solving. Pt's family also educated on pt's current swallowing function and diet recommendations. Both verbalized and demonstrated understanding.   FIM:  Comprehension Comprehension Mode: Auditory Comprehension: 4-Understands basic 75 - 89% of the time/requires cueing 10 - 24% of the time Expression Expression Mode: Verbal Expression: 3-Expresses basic 50 - 74% of the time/requires cueing 25 - 50% of the time. Needs to repeat parts of sentences. Social Interaction Social Interaction:  4-Interacts appropriately 75 - 89% of the time - Needs redirection for appropriate language or to initiate interaction. Problem Solving Problem Solving: 4-Solves basic 75 - 89% of the time/requires cueing 10 - 24% of the time Memory Memory: 3-Recognizes or recalls 50 - 74% of the time/requires cueing 25 - 49% of the time FIM - Eating Eating Activity: 6: Swallowing techniques: self-managed  Pain Pain Assessment Pain Score: 6  Faces Pain Scale: Hurts a little bit Pain Type: Acute pain Pain Location: Head Pain Orientation: Right Pain Descriptors / Indicators: Headache Pain Intervention(s): Medication (See eMAR)  Therapy/Group: Individual Therapy  [name] 01/19/2013, 11:36 AM

## 2013-01-19 NOTE — Progress Notes (Signed)
Physical Therapy Weekly Progress Note  Patient Details  Name: Frances Finley MRN: [id number] Date of Birth: 1955/05/11  Today's Date: 01/19/2013 Time: 4098-1191 Time Calculation (min): 57 min  Patient has met 3 of 3 short term goals.  Pt has made great progress this week and is currently ambulating >150' with RW and prosthesis at min-guard/min assist level. 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 She has made excellent gains in ability to attend to and follow commands during a functional task in a distracting environment resulting in improved safety.   Patient continues to demonstrate the following deficits: decreased dynamic balance, impaired attention, pain, increased need for assistive device, decreased problem solving, mixed aphasia, poor endurance and therefore will continue to benefit from skilled PT intervention to enhance overall performance with activity tolerance, balance, ability to compensate for deficits, attention and awareness.  Patient progressing toward long term goals..  Continue plan of care.  PT Short Term Goals Week 1:  PT Short Term Goal 1 (Week 1): pt will perform bed mobility with S without use of rails.   PT Short Term Goal 1 - Progress (Week 1): Met PT Short Term Goal 2 (Week 1): pt will demo transfers MinA.   PT Short Term Goal 2 - Progress (Week 1): Met PT Short Term Goal 3 (Week 1): pt will amb with RW and MinA 100' in controlled environment.   PT Short Term Goal 3 - Progress (Week 1): Met PT Short Term Goal 4 (Week 1): pt will don/doff prosthesis with only set-up A and verbal cues to initiate task.   PT Short Term Goal 4 - Progress (Week 1): Met  Skilled Therapeutic Interventions/Progress Updates:    Outdoor and community ambulation with RW x 50', 100', 100', up to min assist however pt able to verbalize safety measures such as mobilizing slowly over unlevel surfaces. Husband began family education and provided  min-guard assist with therapist guarding other side of pt. Husband reports h/o prior training after pt's amputation. Curb step x 2 post PT demo, min-guard assist with RW.  Ambulation with RW in gift shop with min assist, pt unable to participate in min challenging money management question due to internal distraction d/t residual limb and HA pain. Pathfinding back to room pt able to remember to return to fourth floor, min cues otherwise. Squat pivot transfers with min-guard assist. Addressed attention, word finding, correction of errors, generalized awareness during session.   Pt demonstrating increased awareness today, without cues pt began to verbalize need for husband to do the grocery shopping and help with food prep at home. Stated husband would need to be "right there" when she was up walking.   Prosthesis on entire session.  Therapy Documentation Precautions:  Precautions Precautions: Fall Precaution Comments: pt with L Prosthetic leg Restrictions Weight Bearing Restrictions: No Pain: Pain Assessment Faces Pain Scale: Hurts little more Pain Type: Acute pain Pain Location: Head Pain Orientation: Right Pain Descriptors / Indicators: Headache Pain Onset: Progressive Pain Intervention(s): RN made aware 2nd Pain Site Wong-Baker Pain Rating: Hurts little more Pain Type: Acute pain Pain Location: Leg Pain Orientation: Left Pain Onset: With Activity Pain Intervention(s): RN made aware  See FIM for current functional status  Therapy/Group: Individual Therapy  Wilhemina Bonito 01/19/2013, 12:01 PM

## 2013-01-19 NOTE — Progress Notes (Signed)
Patient with continuous complaint of pain to left side of head. Rating pain, "10" on scale. Cold wet wash cloth applied per patient's request. [name] PRN OXY IR not due. Paged Marissa Nestle, PA at 0130 with now dose of OXY IR 5mg . Med given and was effective, slept until 0520. Ambulating to bathroom with steadying assistance with walker and prosthesis. Expressive aphasia. Frances Finley

## 2013-01-19 NOTE — Progress Notes (Signed)
Subjective/Complaints: Woke up with headache. Having some diplopia this am A 12 point review of systems has been performed and if not noted above is otherwise negative.   Objective: Vital Signs: Blood pressure 137/81, pulse 94, temperature 98 F (36.7 C), temperature source Oral, resp. rate 18, weight 92.2 kg (203 lb 4.2 oz), SpO2 97.00%. No results found. No results found for this basename: WBC, HGB, HCT, PLT,  in the last 72 hours No results found for this basename: NA, K, CL, CO, GLUCOSE, BUN, CREATININE, CALCIUM,  in the last 72 hours CBG (last 3)  No results found for this basename: GLUCAP,  in the last 72 hours  Wt Readings from Last 3 Encounters:  01/18/13 92.2 kg (203 lb 4.2 oz)  01/09/13 95 kg (209 lb 7 oz)    Physical Exam:  Gen: pt is difficult to arouse  HENT:  Head: Normocephalic.  Eyes: EOM are normal.  No nystagmus  Neck: Normal range of motion. Neck supple. No thyromegaly present.  Cardiovascular: Normal rate and regular rhythm. No murmurs  Pulmonary/Chest: Effort normal and breath sounds normal. No respiratory distress. No wheezes  Abdominal: Soft. Bowel sounds are normal. She exhibits no distension. Mild pain with deep palpation LLQ. No rebound  Musculoskeletal:  Left BKA. Limb well-formed, non-tender Neurological:  Moved all 4's without focal weakness.  Sensed pain in all 4's. Could answer simple questions. Better comprehension. Speaking in short sentences and phrases with better content. Still substitutes words frequently. Gaze is dysconjugate Skin: Skin is warm and dry.  Psychiatric:  alert   Assessment/Plan: 1. Functional deficits secondary to bilateral [facility] which require 3+ hours per day of interdisciplinary therapy in a comprehensive inpatient rehab setting. Physiatrist is providing close team supervision and 24 hour management of active medical problems listed below. Physiatrist and rehab team continue to assess barriers to discharge/monitor patient  progress toward functional and medical goals. FIM: FIM - Bathing Bathing Steps Patient Completed: Chest;Abdomen;Right upper leg;Left lower leg (including foot);Left upper leg;Front perineal area;Right Arm;Left Arm;Buttocks;Right lower leg (including foot) Bathing: 5: Set-up assist to: Obtain items  FIM - Upper Body Dressing/Undressing Upper body dressing/undressing steps patient completed: Thread/unthread right sleeve of pullover shirt/dresss;Thread/unthread left sleeve of pullover shirt/dress;Put head through opening of pull over shirt/dress;Pull shirt over trunk Upper body dressing/undressing: 5: Set-up assist to: Obtain clothing/put away FIM - Lower Body Dressing/Undressing Lower body dressing/undressing steps patient completed: Thread/unthread right underwear leg;Thread/unthread left underwear leg;Pull underwear up/down;Thread/unthread right pants leg;Thread/unthread left pants leg;Pull pants up/down;Don/Doff right sock;Don/Doff right shoe Lower body dressing/undressing: 4: Steadying Assist  FIM - Toileting Toileting steps completed by patient: Adjust clothing prior to toileting;Performs perineal hygiene;Adjust clothing after toileting Toileting Assistive Devices: Grab bar or rail for support Toileting: 5: Supervision: Safety issues/verbal cues  FIM - Diplomatic Services operational officer Devices: Grab bars Toilet Transfers: 4-From toilet/BSC: Min A (steadying Pt. > 75%);4-To toilet/BSC: Min A (steadying Pt. > 75%)  FIM - Bed/Chair Transfer Bed/Chair Transfer Assistive Devices: Bed rails Bed/Chair Transfer: 5: Supine > Sit: Supervision (verbal cues/safety issues);5: Sit > Supine: Supervision (verbal cues/safety issues);4: Bed > Chair or W/C: Min A (steadying Pt. > 75%);4: Chair or W/C > Bed: Min A (steadying Pt. > 75%)  FIM - Locomotion: Wheelchair Locomotion: Wheelchair: 0: Activity did not occur FIM - Locomotion: Ambulation Locomotion: Ambulation Assistive Devices: Dealer Ambulation/Gait Assistance: 4: Min assist Locomotion: Ambulation: 4: Travels 150 ft or more with minimal assistance (Pt.>75%)  Comprehension Comprehension Mode: Auditory Comprehension: 4-Understands basic  75 - 89% of the time/requires cueing 10 - 24% of the time  Expression Expression Mode: Verbal Expression: 3-Expresses basic 50 - 74% of the time/requires cueing 25 - 50% of the time. Needs to repeat parts of sentences.  Social Interaction Social Interaction: 4-Interacts appropriately 75 - 89% of the time - Needs redirection for appropriate language or to initiate interaction.  Problem Solving Problem Solving: 3-Solves basic 50 - 74% of the time/requires cueing 25 - 49% of the time  Memory Memory: 3-Recognizes or recalls 50 - 74% of the time/requires cueing 25 - 49% of the time  Medical Problem List and Plan:  1. Bilateral subdural hematomas after recent/remote fall  2. DVT Prophylaxis/Anticoagulation: SCDs. Monitor for signs of DVT. History of DVT and pulmonary emboli. Coumadin discontinued secondary to subdural hematoma  3. Pain Management: Neurontin 600 mg twice a day, Tylenol as needed. Monitor with increased mobility. Patient was taking OxyContin sustained-release 10 mg 3 times a day prior to admission. Had some abdominal pain PTA--still doesn't appear that severe at this time. Observe for now. 4. Mood/anxiety. Valium 5 mg every 6 hours as needed--limit as possible given neuro-cognitive deficits  5. Neuropsych: This patient is not capable of making decisions on her own behalf.  6. Seizure prophylaxis. Keppra 500 mg twice a day. Monitor for any signs of seizure  7. 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 Hypothyroidism. Synthroid  9. Hypertension. Lasix 40 mg twice a day, hydrochlorothiazide 25 mg daily. Controlled at present 10. Hypokalemia. Continue supplement and followup labs--continue to replace 11. Leukocytosis--?reactive--afebrile, no signs of  infection 12. Hemochromatosis--may be behind her increased hemoglobin---has been elevated it appears chronically.  LOS (Days) 9 A FACE TO FACE EVALUATION WAS PERFORMED  SWARTZ,ZACHARY T 01/19/2013 8:42 AM

## 2013-01-20 ENCOUNTER — Inpatient Hospital Stay (HOSPITAL_COMMUNITY): Payer: Medicare Other | Admitting: Physical Therapy

## 2013-01-20 ENCOUNTER — Encounter (HOSPITAL_COMMUNITY): Payer: Medicare Other | Admitting: Occupational Therapy

## 2013-01-20 ENCOUNTER — Inpatient Hospital Stay (HOSPITAL_COMMUNITY): Payer: Medicare Other | Admitting: Occupational Therapy

## 2013-01-20 ENCOUNTER — Inpatient Hospital Stay (HOSPITAL_COMMUNITY): Payer: Medicare Other

## 2013-01-20 DIAGNOSIS — S065X9A Traumatic subdural hemorrhage with loss of consciousness of unspecified duration, initial encounter: Secondary | ICD-10-CM

## 2013-01-20 DIAGNOSIS — S065XAA Traumatic subdural hemorrhage with loss of consciousness status unknown, initial encounter: Secondary | ICD-10-CM

## 2013-01-20 DIAGNOSIS — W19XXXA Unspecified fall, initial encounter: Secondary | ICD-10-CM

## 2013-01-20 DIAGNOSIS — S88119A Complete traumatic amputation at level between knee and ankle, unspecified lower leg, initial encounter: Secondary | ICD-10-CM

## 2013-01-20 MED ORDER — SORBITOL 70 % SOLN
60.0000 mL | Freq: Once | Status: DC
  Filled 2013-01-20: qty 60

## 2013-01-20 MED ORDER — TOPIRAMATE 25 MG PO TABS
25.0000 mg | ORAL_TABLET | Freq: Every day | ORAL | Status: DC
  Administered 2013-01-20 – 2013-01-22 (×3): 25 mg via ORAL
  Filled 2013-01-20 (×4): qty 1

## 2013-01-20 NOTE — Progress Notes (Signed)
Speech Language Pathology Daily Session Note  Patient Details  Name: Frances Finley MRN: [id number] Date of Birth: 1956-03-05  Today's Date: 01/20/2013 Time: 1130-1200 Time Calculation (min): 30 min  Short Term Goals: Week 2: SLP Short Term Goal 1 (Week 2): Pt will demonstrate reading comprehension at the word level with 75% accuracy with Mod multimodal cueing  SLP Short Term Goal 2 (Week 2): Pt will name functional items with Min A multimodal cueing  SLP Short Term Goal 3 (Week 2): Pt will self-monitor and correct errors with Mod A multimodal cueing  SLP Short Term Goal 4 (Week 2): Pt will utilize call bell to express wants/needs with Mod A multimodal cueing  SLP Short Term Goal 5 (Week 2): Pt will demonstrate selective attention to a familiar task for 30 minutes with supervision verbal cues.  SLP Short Term Goal 6 (Week 2): Pt will follow 2 step commands with Mod  A multimodal cueing   Skilled Therapeutic Interventions: Skilled treatment focused on cognitive-linguistic goals. SLP facilitated session with family education and training regarding communication strategies, with patient's husband, step-daughter, and father present. Pt completed a 4-picture sequencing task with Min question cues from SLP. During the activity, she demonstrated confrontational naming with Mod semantic and phonemic cues. Pt's husband participated in session by providing some of the cueing as described above.    FIM:  Comprehension Comprehension Mode: Auditory Comprehension: 5-Understands basic 90% of the time/requires cueing < 10% of the time Expression Expression Mode: Verbal Expression: 3-Expresses basic 50 - 74% of the time/requires cueing 25 - 50% of the time. Needs to repeat parts of sentences. Social Interaction Social Interaction: 5-Interacts appropriately 90% of the time - Needs monitoring or encouragement for participation or interaction. Problem Solving Problem Solving: 4-Solves basic 75 - 89% of  the time/requires cueing 10 - 24% of the time Memory Memory: 4-Recognizes or recalls 75 - 89% of the time/requires cueing 10 - 24% of the time FIM - Eating Eating Activity: 6: Swallowing techniques: self-managed  Pain Pain Assessment Pain Assessment: 0-10 Pain Score: 6  Pain Type: Acute pain Pain Location: Head Pain Orientation: Left Pain Descriptors / Indicators: Sharp;Aching Patients Stated Pain Goal: 3 Pain Intervention(s): Medication (See eMAR)  Therapy/Group: Individual Therapy  Maxcine Ham 01/20/2013, 4:58 PM  Maxcine Ham, M.A. CCC-SLP [phone]

## 2013-01-20 NOTE — Progress Notes (Signed)
Social Work Patient ID: Frances Finley, female   DOB: 06/21/1955, 57 y.o.   MRN: [id number]  Notified by therapies that family education went well today and they feel pt could actually d/c Monday after final ST family ed.  MD aware and agreeable.  Pt and family aware and VERY agreeable.  Have arranged for [facility] follow up.  No DME needs (already has necessary DME).  D/c 01/23/13.  Frances Mabry, LCSW

## 2013-01-20 NOTE — Progress Notes (Signed)
Subjective/Complaints: Still having headache. Has increased a little over the last few days. A 12 point review of systems has been performed and if not noted above is otherwise negative.   Objective: Vital Signs: Blood pressure 96/61, pulse 81, temperature 98.2 F (36.8 C), temperature source Oral, resp. rate 18, weight 92.2 kg (203 lb 4.2 oz), SpO2 95.00%. No results found. No results found for this basename: WBC, HGB, HCT, PLT,  in the last 72 hours No results found for this basename: NA, K, CL, CO, GLUCOSE, BUN, CREATININE, CALCIUM,  in the last 72 hours CBG (last 3)  No results found for this basename: GLUCAP,  in the last 72 hours  Wt Readings from Last 3 Encounters:  01/18/13 92.2 kg (203 lb 4.2 oz)  01/09/13 95 kg (209 lb 7 oz)    Physical Exam:  Gen: pt is difficult to arouse  HENT:  Head: Normocephalic.  Eyes: EOM are normal.  No nystagmus  Neck: Normal range of motion. Neck supple. No thyromegaly present.  Cardiovascular: Normal rate and regular rhythm. No murmurs  Pulmonary/Chest: Effort normal and breath sounds normal. No respiratory distress. No wheezes  Abdominal: Soft. Bowel sounds are normal. She exhibits no distension. Mild pain with deep palpation LLQ. No rebound  Musculoskeletal:  Left BKA. Limb well-formed, non-tender Neurological:  Moved all 4's without focal weakness.  Sensed pain in all 4's. Could answer simple questions. Better comprehension. Speaking in short sentences and phrases with better content. Still substitutes words frequently. Gaze is dysconjugate Skin: Skin is warm and dry.  Psychiatric:  alert   Assessment/Plan: 1. Functional deficits secondary to bilateral [facility] which require 3+ hours per day of interdisciplinary therapy in a comprehensive inpatient rehab setting. Physiatrist is providing close team supervision and 24 hour management of active medical problems listed below. Physiatrist and rehab team continue to assess barriers to  discharge/monitor patient progress toward functional and medical goals. FIM: FIM - Bathing Bathing Steps Patient Completed: Chest;Abdomen;Right upper leg;Left lower leg (including foot);Left upper leg;Front perineal area;Right Arm;Left Arm;Buttocks;Right lower leg (including foot) Bathing: 5: Set-up assist to: Obtain items  FIM - Upper Body Dressing/Undressing Upper body dressing/undressing steps patient completed: Thread/unthread right sleeve of pullover shirt/dresss;Thread/unthread left sleeve of pullover shirt/dress;Put head through opening of pull over shirt/dress;Pull shirt over trunk Upper body dressing/undressing: 5: Supervision: Safety issues/verbal cues FIM - Lower Body Dressing/Undressing Lower body dressing/undressing steps patient completed: Thread/unthread right underwear leg;Thread/unthread left underwear leg;Pull underwear up/down;Thread/unthread right pants leg;Thread/unthread left pants leg;Pull pants up/down;Don/Doff right sock;Don/Doff right shoe Lower body dressing/undressing: 5: Supervision: Safety issues/verbal cues  FIM - Toileting Toileting steps completed by patient: Adjust clothing prior to toileting;Performs perineal hygiene;Adjust clothing after toileting Toileting Assistive Devices: Grab bar or rail for support;Toilet Aid/prosthesis/orthosis Toileting: 5: Supervision: Safety issues/verbal cues  FIM - Diplomatic Services operational officer Devices: Grab bars Toilet Transfers: 4-From toilet/BSC: Min A (steadying Pt. > 75%);4-To toilet/BSC: Min A (steadying Pt. > 75%)  FIM - Bed/Chair Transfer Bed/Chair Transfer Assistive Devices: Bed rails Bed/Chair Transfer: 5: Supine > Sit: Supervision (verbal cues/safety issues);5: Sit > Supine: Supervision (verbal cues/safety issues);4: Bed > Chair or W/C: Min A (steadying Pt. > 75%);4: Chair or W/C > Bed: Min A (steadying Pt. > 75%)  FIM - Locomotion: Wheelchair Locomotion: Wheelchair: 0: Activity did not occur FIM -  Locomotion: Ambulation Locomotion: Ambulation Assistive Devices: Designer, industrial/product Ambulation/Gait Assistance: 4: Min guard Locomotion: Ambulation: 1: Travels less than 50 ft with minimal assistance (Pt.>75%)  Comprehension Comprehension Mode:  Auditory Comprehension: 4-Understands basic 75 - 89% of the time/requires cueing 10 - 24% of the time  Expression Expression Mode: Verbal Expression: 3-Expresses basic 50 - 74% of the time/requires cueing 25 - 50% of the time. Needs to repeat parts of sentences.  Social Interaction Social Interaction: 4-Interacts appropriately 75 - 89% of the time - Needs redirection for appropriate language or to initiate interaction.  Problem Solving Problem Solving: 4-Solves basic 75 - 89% of the time/requires cueing 10 - 24% of the time  Memory Memory: 3-Recognizes or recalls 50 - 74% of the time/requires cueing 25 - 49% of the time  Medical Problem List and Plan:  1. Bilateral subdural hematomas after recent/remote fall  2. DVT Prophylaxis/Anticoagulation: SCDs. Monitor for signs of DVT. History of DVT and pulmonary emboli. Coumadin discontinued secondary to subdural hematoma  3. Pain Management: Neurontin 600 mg twice a day, Tylenol as needed. Monitor with increased mobility. Patient was taking OxyContin sustained-release 10 mg 3 times a day prior to admission. Had some abdominal pain PTA--still doesn't appear that severe at this time. Observe for now.  -will add low dose topamax to the gabapentin to assist with rx of headache 4. Mood/anxiety. Valium 5 mg every 6 hours as needed--limit as possible given neuro-cognitive deficits  5. Neuropsych: This patient is not capable of making decisions on her own behalf.  6. Seizure prophylaxis. Keppra 500 mg twice a day. Monitor for any signs of seizure  7. 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 Hypothyroidism. Synthroid  9. Hypertension. Lasix 40 mg twice a day, hydrochlorothiazide 25 mg daily.  Controlled at present 10. Hypokalemia. Continue supplement and followup labs--continue to replace 11. Leukocytosis--?reactive--afebrile, no signs of infection 12. Hemochromatosis--may be behind her increased hemoglobin---has been elevated it appears chronically.  -recheck next week  LOS (Days) 10 A FACE TO FACE EVALUATION WAS PERFORMED  Lawrie Tunks T 01/20/2013 7:58 AM

## 2013-01-20 NOTE — Progress Notes (Signed)
Physical Therapy Session Note  Patient Details  Name: Frances Finley MRN: [id number] Date of Birth: 1955-09-27  Today's Date: 01/20/2013 Time: 4696-2952 Time Calculation (min): 45 min  Short Term Goals: Week 2:  PT Short Term Goal 1 (Week 2): LTGs  Skilled Therapeutic Interventions/Progress Updates:    After discussion with OT, allowed pt to practiced bed <> toilet transfers at wheelchair level with prosthesis. Pt able to perform at modified independent level. Pt aware she is to not ambulate in room by self. Family also aware, safety plan updated. Pt with HA, RN brought medication.  Family present for session and participated in family education. Family provided min-guard/supervision assist during ambulation with RW x 100', pt limited by residual limb pain from prosthesis, adjusted sock thickness however pain still limiting factor. Attempted stair training, PT demonstrating for family education however pt unable to perform as she became emotional due to HA. Pt returned to room and was able to transfer to bed with modified independence. RN made aware. Pt able to independently utilize sign on wall to recall orientation but needs cues at times to assist with word finding and correction of expressive errors.    Therapy Documentation Precautions:  Precautions Precautions: Fall Precaution Comments: pt with L Prosthetic leg Restrictions Weight Bearing Restrictions: No Pain: Pain Assessment Pain Assessment: No/denies pain Pain Score: 9  Pain Type: Acute pain Pain Location: Head Pain Orientation: Left Pain Descriptors / Indicators: Sharp;Aching (pressure into eye) Pain Onset: On-going Pain Intervention(s): RN made aware (REst in  bed)  See FIM for current functional status  Therapy/Group: Individual Therapy  Wilhemina Bonito 01/20/2013, 11:57 AM

## 2013-01-20 NOTE — Progress Notes (Signed)
This note has been reviewed and this clinician agrees with information provided.  

## 2013-01-20 NOTE — Progress Notes (Signed)
Occupational Therapy Session Notes  Patient Details  Name: Frances Finley MRN: [id number] Date of Birth: [birth year]  Today's Date: 01/20/2013 Skilled Therapeutic Interventions/Progress Updates:     First Session: Individual Time: 1000-1045 Time Calculation: 45 minutes.  Pt engaged in 1:1 self-care session, husband and step-daughter present for family education.  Husband assisted pt with choosing clothing.  Pt completed tub/shower transfer with RW and supervision. Bathed with min verbal cues. Husband educated on level of assist pt requires for ADL completion (supervision/set-up) and safety considerations, as well as pts functional progress since admission to unit.  Pt sequenced dressing tasks appropriately and able to communicate functional needs verbally/ with gestures.  Groomed at sink in room with set-up/no verbal cues for task. Husband verbalized understanding of information presented.  Case manager in to discuss further therapy options upon d/c.   Second Session: Individual Time: 1300-1350 Time Calculation: 50 minutes. 10 minutes missed therapy due to pain.   Pt engaged in 1:1 session focused on short-term memory, simple problem solving, functional mobility, selective attention.  Pt self-propelled w/c to gift shop, able to read directional sign for destination and follow instructions with min questioning cues.  Approximately 2-3 minutes prior to entering gift shop, pt was given the names of three items to locate.  Upon entry to gift shop, pt able to recall one of three items (max A required to recall all items).  Pt able to demonstrate selective attention to search for items in a max visually distracting environment with mod verbal cues.  Pt verbally demonstrated basic money management skills.  Complained of sudden/acute headache, pt spoke to RN about pain and awaiting next schedule medication.  Pt recalled name of MD and topic of discussion he and pt had this a.m. and demonstrated increased  awareness/insight of current medical condition (abstract concepts). Ten minutes therapy missed this session.    Therapy Documentation Precautions:  Precautions Precautions: Fall Precaution Comments: pt with L Prosthetic leg Restrictions Weight Bearing Restrictions: No    Pain: First session: None. Second session: Complaint of sudden head pain. RN notified/pt awaiting next scheduled pain medication.   See FIM for current functional status  Therapy/Group: Individual Therapy  Kandis Ban 01/20/2013, 12:10 PM

## 2013-01-20 NOTE — Plan of Care (Signed)
Problem: RH BLADDER ELIMINATION Goal: RH STG MANAGE BLADDER WITH ASSISTANCE STG Manage Bladder With Min Assistance  Outcome: Completed/Met Date Met:  01/20/13 Modified independent in room with assistive device

## 2013-01-20 NOTE — Plan of Care (Signed)
Problem: RH BOWEL ELIMINATION Goal: RH STG MANAGE BOWEL WITH ASSISTANCE STG Manage Bowel with Min Assistance.  Outcome: Completed/Met Date Met:  01/20/13 Modified independent in room with assistive device.

## 2013-01-21 ENCOUNTER — Inpatient Hospital Stay (HOSPITAL_COMMUNITY): Payer: Medicare Other | Admitting: Occupational Therapy

## 2013-01-21 DIAGNOSIS — W19XXXA Unspecified fall, initial encounter: Secondary | ICD-10-CM

## 2013-01-21 DIAGNOSIS — S88119A Complete traumatic amputation at level between knee and ankle, unspecified lower leg, initial encounter: Secondary | ICD-10-CM

## 2013-01-21 DIAGNOSIS — S065X9A Traumatic subdural hemorrhage with loss of consciousness of unspecified duration, initial encounter: Secondary | ICD-10-CM

## 2013-01-21 NOTE — Progress Notes (Signed)
Patient ID: Frances Finley, female   DOB: [birth year], 57 y.o.   MRN: [id number] Subjective/Complaints: Still having headache. Has increased a little over the last few days. A 12 point review of systems has been performed and if not noted above is otherwise negative.   Objective: Vital Signs: Blood pressure 117/74, pulse 86, temperature 97.5 F (36.4 C), temperature source Oral, resp. rate 16, weight 92.2 kg (203 lb 4.2 oz), SpO2 99.00%. No results found. No results found for this basename: WBC, HGB, HCT, PLT,  in the last 72 hours No results found for this basename: NA, K, CL, CO, GLUCOSE, BUN, CREATININE, CALCIUM,  in the last 72 hours CBG (last 3)  No results found for this basename: GLUCAP,  in the last 72 hours  Wt Readings from Last 3 Encounters:  01/18/13 92.2 kg (203 lb 4.2 oz)  01/09/13 95 kg (209 lb 7 oz)    Physical Exam:  Gen: pt is easy to arouse , oriented to self Head: Normocephalic.  Eyes: EOM are normal.  No nystagmus  Neck: Normal range of motion. Neck supple. No thyromegaly present.  Cardiovascular: Normal rate and regular rhythm. No murmurs  Pulmonary/Chest: Effort normal and breath sounds normal. No respiratory distress. No wheezes  Abdominal: Soft. Bowel sounds are normal. She exhibits no distension. Mild pain with deep palpation LLQ. No rebound  Musculoskeletal:  Left BKA. Limb well-formed, non-tender Neurological:  Moved all 4's without focal weakness.  Sensed pain in all 4's. Could answer simple questions. Better comprehension. Speaking in short sentences and phrases with better content. Still substitutes words frequently. Gaze is dysconjugate Skin: Skin is warm and dry.  Psychiatric:  alert   Assessment/Plan: 1. Functional deficits secondary to bilateral [facility] which require 3+ hours per day of interdisciplinary therapy in a comprehensive inpatient rehab setting. Physiatrist is providing close team supervision and 24 hour management of active medical  problems listed below. Physiatrist and rehab team continue to assess barriers to discharge/monitor patient progress toward functional and medical goals. FIM: FIM - Bathing Bathing Steps Patient Completed: Chest;Abdomen;Right upper leg;Left lower leg (including foot);Left upper leg;Front perineal area;Right Arm;Left Arm;Buttocks;Right lower leg (including foot) Bathing: 5: Supervision: Safety issues/verbal cues  FIM - Upper Body Dressing/Undressing Upper body dressing/undressing steps patient completed: Thread/unthread right sleeve of pullover shirt/dresss;Thread/unthread left sleeve of pullover shirt/dress;Put head through opening of pull over shirt/dress;Pull shirt over trunk Upper body dressing/undressing: 5: Supervision: Safety issues/verbal cues FIM - Lower Body Dressing/Undressing Lower body dressing/undressing steps patient completed: Thread/unthread right underwear leg;Thread/unthread left underwear leg;Pull underwear up/down;Thread/unthread right pants leg;Thread/unthread left pants leg;Pull pants up/down;Don/Doff right sock;Don/Doff right shoe Lower body dressing/undressing: 5: Supervision: Safety issues/verbal cues  FIM - Toileting Toileting steps completed by patient: Adjust clothing prior to toileting;Performs perineal hygiene;Adjust clothing after toileting Toileting Assistive Devices: Grab bar or rail for support Toileting: 6: Assistive device: No helper  FIM - Diplomatic Services operational officer Devices: Therapist, music Transfers: 6-To toilet/ BSC;6-From toilet/BSC  FIM - Banker Devices: Bed rails Bed/Chair Transfer: 6: Assistive device: no helper  FIM - Locomotion: Wheelchair Locomotion: Wheelchair: 5: Travels 150 ft or more: maneuvers on rugs and over door sills with supervision, cueing or coaxing FIM - Locomotion: Ambulation Locomotion: Ambulation Assistive Devices: Designer, industrial/product Ambulation/Gait Assistance: 4: Min  guard Locomotion: Ambulation: 2: Travels 50 - 149 ft with minimal assistance (Pt.>75%)  Comprehension Comprehension Mode: Auditory Comprehension: 5-Understands basic 90% of the time/requires cueing < 10% of the time  Expression  Expression Mode: Verbal Expression: 3-Expresses basic 50 - 74% of the time/requires cueing 25 - 50% of the time. Needs to repeat parts of sentences.  Social Interaction Social Interaction: 5-Interacts appropriately 90% of the time - Needs monitoring or encouragement for participation or interaction.  Problem Solving Problem Solving: 4-Solves basic 75 - 89% of the time/requires cueing 10 - 24% of the time  Memory Memory: 4-Recognizes or recalls 75 - 89% of the time/requires cueing 10 - 24% of the time  Medical Problem List and Plan:  1. Bilateral subdural hematomas after recent/remote fall  2. DVT Prophylaxis/Anticoagulation: SCDs. Monitor for signs of DVT. History of DVT and pulmonary emboli. Coumadin discontinued secondary to subdural hematoma  3. Pain Management: Neurontin 600 mg twice a day, Tylenol as needed. Monitor with increased mobility. Patient was taking OxyContin sustained-release 10 mg 3 times a day prior to admission. Had some abdominal pain PTA--still doesn't appear that severe at this time. Observe for now.  -will add low dose topamax to the gabapentin to assist with rx of headache 4. Mood/anxiety. Valium 5 mg every 6 hours as needed--limit as possible given neuro-cognitive deficits  5. Neuropsych: This patient is not capable of making decisions on her own behalf.  6. Seizure prophylaxis. Keppra 500 mg twice a day. Monitor for any signs of seizure  7. 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 Hypothyroidism. Synthroid  9. Hypertension. Lasix 40 mg twice a day, hydrochlorothiazide 25 mg daily. Controlled at present 10. Hypokalemia. Continue supplement and followup labs--continue to replace 11. Leukocytosis--?reactive--afebrile, no  signs of infection 12. Hemochromatosis--may be behind her increased hemoglobin---has been elevated it appears chronically.  -recheck next week  LOS (Days) 11 A FACE TO FACE EVALUATION WAS PERFORMED  Erick Colace 01/21/2013 7:54 AM

## 2013-01-21 NOTE — Progress Notes (Signed)
Occupational Therapy Session Note  Patient Details  Name: Jamita Mckelvin MRN: [id number] Date of Birth: 09-28-1955  Today's Date: 01/21/2013 Time: 1300-1400 Time Calculation (min): 60 min  Skilled Therapeutic Interventions/Progress Updates Mrs. Hassan participated in a dominoe game with focus on standing balance, visual perceptual and cognitive skills.  Initially patient had difficulty understanding how to play the game, but with practice she was able to independently process the steps of the game.   After standing x4 times for about 5 minutes each, patient c/o 8/10 pain in her left LE at edge of residual limb area near the contact with her prosthesis.  RN brought patient pain medicine.   Therapy Documentation Precautions:  Precautions Precautions: Fall Precaution Comments: pt with L Prosthetic leg Restrictions Weight Bearing Restrictions: No  Pain:  See FIM for current functional status  Therapy/Group: Group Therapy  Rozelle Logan 01/21/2013, 5:29 PM

## 2013-01-22 ENCOUNTER — Inpatient Hospital Stay (HOSPITAL_COMMUNITY): Payer: Medicare Other | Admitting: *Deleted

## 2013-01-22 NOTE — Progress Notes (Signed)
Physical Therapy Session Note  Patient Details  Name: Shuntel Fishburn MRN: [id number] Date of Birth: 1956/02/10  Today's Date: 01/22/2013 Time: 1330-1405 Time Calculation (min): 35 min   Skilled Therapeutic Interventions/Progress Updates:  Transfer trainng from bed to w/c with supervision, w/c to mat with supervision, mul;tiple reps to increase safety and repeat sequencing. Gait training with RW 2 x 120 feet  With Supervision and verbal cues to reduce shoulder shrug and correct posture. Training in gait with SPC with Supervision to min A 2 x 30 feet. Stairs 1 x 10 steps with Supervision.OPatient returned to the room, decided to stay sitting in her w/c all needs within reach. Therapy Documentation Precautions:  Precautions Precautions: Fall Precaution Comments: pt with L Prosthetic leg Restrictions Weight Bearing Restrictions: No Pain: Pain Assessment Pain Assessment: 0-10 Pain Score: 9  Pain Type: Acute pain Pain Location: Leg Pain Orientation: Left Pain Descriptors / Indicators: Aching;Shooting Pain Frequency: Intermittent Pain Onset: Gradual Pain Intervention(s): Medication (See eMAR)   See FIM for current functional status  Therapy/Group: Individual Therapy  Dorna Mai 01/22/2013, 2:05 PM

## 2013-01-22 NOTE — Discharge Summary (Signed)
Discharge summary job # [id number]

## 2013-01-22 NOTE — Progress Notes (Signed)
Patient ID: Evern Bio, female   DOB: December 27, 1955, 57 y.o.   MRN: [id number] Subjective/Complaints: No headache complaints A 12 point review of systems has been performed and if not noted above is otherwise negative.   Objective: Vital Signs: Blood pressure 120/79, pulse 82, temperature 98.7 F (37.1 C), temperature source Oral, resp. rate 18, weight 92.2 kg (203 lb 4.2 oz), SpO2 97.00%. No results found. No results found for this basename: WBC, HGB, HCT, PLT,  in the last 72 hours No results found for this basename: NA, K, CL, CO, GLUCOSE, BUN, CREATININE, CALCIUM,  in the last 72 hours CBG (last 3)  No results found for this basename: GLUCAP,  in the last 72 hours  Wt Readings from Last 3 Encounters:  01/18/13 92.2 kg (203 lb 4.2 oz)  01/09/13 95 kg (209 lb 7 oz)    Physical Exam:  Gen: pt is easy to arouse , oriented to self Head: Normocephalic.  Eyes: EOM are normal.  No nystagmus  Neck: Normal range of motion. Neck supple. No thyromegaly present.  Cardiovascular: Normal rate and regular rhythm. No murmurs  Pulmonary/Chest: Effort normal and breath sounds normal. No respiratory distress. No wheezes  Abdominal: Soft. Bowel sounds are normal. She exhibits no distension. Mild pain with deep palpation LLQ. No rebound  Musculoskeletal:  Left BKA. Limb well-formed, non-tender Neurological:  Moved all 4's without focal weakness.  Sensed pain in all 4's. Could answer simple questions. Better comprehension. Speaking in short sentences and phrases with better content. Still substitutes words frequently. Gaze is dysconjugate Skin: Skin is warm and dry.  Psychiatric:  alert   Assessment/Plan: 1. Functional deficits secondary to bilateral [facility] which require 3+ hours per day of interdisciplinary therapy in a comprehensive inpatient rehab setting. Physiatrist is providing close team supervision and 24 hour management of active medical problems listed below. Physiatrist and rehab  team continue to assess barriers to discharge/monitor patient progress toward functional and medical goals. FIM: FIM - Bathing Bathing Steps Patient Completed: Chest;Abdomen;Right upper leg;Left lower leg (including foot);Left upper leg;Front perineal area;Right Arm;Left Arm;Buttocks;Right lower leg (including foot) Bathing: 5: Supervision: Safety issues/verbal cues  FIM - Upper Body Dressing/Undressing Upper body dressing/undressing steps patient completed: Thread/unthread right sleeve of pullover shirt/dresss;Thread/unthread left sleeve of pullover shirt/dress;Put head through opening of pull over shirt/dress;Pull shirt over trunk Upper body dressing/undressing: 5: Supervision: Safety issues/verbal cues FIM - Lower Body Dressing/Undressing Lower body dressing/undressing steps patient completed: Thread/unthread right underwear leg;Thread/unthread left underwear leg;Pull underwear up/down;Thread/unthread right pants leg;Thread/unthread left pants leg;Pull pants up/down;Don/Doff right sock;Don/Doff right shoe Lower body dressing/undressing: 5: Supervision: Safety issues/verbal cues  FIM - Toileting Toileting steps completed by patient: Adjust clothing prior to toileting;Performs perineal hygiene;Adjust clothing after toileting (mod I to bathroom) Toileting Assistive Devices: Grab bar or rail for support Toileting: 6: Assistive device: No helper  FIM - Diplomatic Services operational officer Devices: Grab bars;Prosthesis Toilet Transfers: 6-Assistive device: No helper  FIM - Banker Devices: Bed rails;Prosthesis Bed/Chair Transfer: 6: Assistive device: no helper (from bed to chair and vice versa)  FIM - Locomotion: Wheelchair Locomotion: Wheelchair: 5: Travels 150 ft or more: maneuvers on rugs and over door sills with supervision, cueing or coaxing FIM - Locomotion: Ambulation Locomotion: Ambulation Assistive Devices: Dealer Ambulation/Gait Assistance: 4: Min guard Locomotion: Ambulation: 2: Travels 50 - 149 ft with minimal assistance (Pt.>75%)  Comprehension Comprehension Mode: Auditory Comprehension: 5-Understands basic 90% of the time/requires cueing < 10% of the time  Expression Expression Mode: Verbal Expression: 3-Expresses basic 50 - 74% of the time/requires cueing 25 - 50% of the time. Needs to repeat parts of sentences.  Social Interaction Social Interaction: 5-Interacts appropriately 90% of the time - Needs monitoring or encouragement for participation or interaction.  Problem Solving Problem Solving: 4-Solves basic 75 - 89% of the time/requires cueing 10 - 24% of the time  Memory Memory: 5-Recognizes or recalls 90% of the time/requires cueing < 10% of the time  Medical Problem List and Plan:  1. Bilateral subdural hematomas after recent/remote fall  2. DVT Prophylaxis/Anticoagulation: SCDs. Monitor for signs of DVT. History of DVT and pulmonary emboli. Coumadin discontinued secondary to subdural hematoma  3. Pain Management: Neurontin 600 mg twice a day, Tylenol as needed. Monitor with increased mobility. Patient was taking OxyContin sustained-release 10 mg 3 times a day prior to admission.   -will add low dose topamax to the gabapentin to assist with rx of headache 4. Mood/anxiety. Valium 5 mg every 6 hours as needed--limit as possible given neuro-cognitive deficits  5. Neuropsych: This patient is not capable of making decisions on her own behalf.  6. Seizure prophylaxis. Keppra 500 mg twice a day. Monitor for any signs of seizure  7. 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 Hypothyroidism. Synthroid  9. Hypertension. Lasix 40 mg twice a day, hydrochlorothiazide 25 mg daily. Controlled at present 10. Hypokalemia. Continue supplement and followup labs--continue to replace 11. Leukocytosis--?reactive--afebrile, no signs of infection 12. Hemochromatosis--may be behind her  increased hemoglobin---has been elevated it appears chronically.  -recheck next week  LOS (Days) 12 A FACE TO FACE EVALUATION WAS PERFORMED  Erick Colace 01/22/2013 9:57 AM

## 2013-01-23 ENCOUNTER — Encounter (HOSPITAL_COMMUNITY): Payer: Medicare Other | Admitting: Occupational Therapy

## 2013-01-23 ENCOUNTER — Inpatient Hospital Stay (HOSPITAL_COMMUNITY): Payer: Medicare Other | Admitting: Physical Therapy

## 2013-01-23 ENCOUNTER — Inpatient Hospital Stay (HOSPITAL_COMMUNITY): Payer: Medicare Other | Admitting: Speech Pathology

## 2013-01-23 MED ORDER — LEVETIRACETAM 500 MG PO TABS: 500.0000 mg | ORAL_TABLET | Freq: Two times a day (BID) | ORAL | Status: DC

## 2013-01-23 MED ORDER — ESOMEPRAZOLE MAGNESIUM 40 MG PO CPDR: 40.0000 mg | DELAYED_RELEASE_CAPSULE | Freq: Two times a day (BID) | ORAL | Status: DC

## 2013-01-23 MED ORDER — OXYCODONE HCL 5 MG PO TABS: 5.0000 mg | ORAL_TABLET | ORAL | Status: DC | PRN

## 2013-01-23 MED ORDER — POTASSIUM CHLORIDE CRYS ER 20 MEQ PO TBCR: 20.0000 meq | EXTENDED_RELEASE_TABLET | Freq: Two times a day (BID) | ORAL | Status: DC

## 2013-01-23 MED ORDER — GABAPENTIN 600 MG PO TABS: 600.0000 mg | ORAL_TABLET | Freq: Two times a day (BID) | ORAL | Status: DC

## 2013-01-23 MED ORDER — FUROSEMIDE 40 MG PO TABS: 40.0000 mg | ORAL_TABLET | Freq: Two times a day (BID) | ORAL | Status: DC

## 2013-01-23 MED ORDER — SIMVASTATIN 20 MG PO TABS: 20.0000 mg | ORAL_TABLET | Freq: Every evening | ORAL | Status: DC

## 2013-01-23 MED ORDER — LEVOTHYROXINE SODIUM 100 MCG PO TABS: 100.0000 ug | ORAL_TABLET | Freq: Every day | ORAL | Status: DC

## 2013-01-23 MED ORDER — TOPIRAMATE 25 MG PO TABS: 25.0000 mg | ORAL_TABLET | Freq: Every day | ORAL | Status: DC

## 2013-01-23 NOTE — Progress Notes (Signed)
Occupational Therapy Discharge Summary  Patient Details  Name: Frances Finley MRN: [id number] Date of Birth: 29-Nov-1955  Today's Date: 01/23/2013  Patient has met 10 of 10 long term goals due to improved activity tolerance, improved balance, ability to compensate for deficits, improved attention, improved awareness and improved coordination.  Patient to discharge at overall Supervision level.  [name] care partner is independent to provide the necessary physical and cognitive assistance at discharge.  Family education completed with husband, verbalized and demonstrated understanding of information presented.  Pt has overall made significant improvements that allow increased independence in ADL engagement.  Recommend initial 24/7 supervision/assist as pt continues to receive additional home health therapy.     Recommendation:  Patient will benefit from ongoing skilled OT services in home health setting to continue to advance functional skills in the area of BADL and iADL.  Equipment: No equipment provided  Reasons for discharge: treatment goals met  Patient/family agrees with progress made and goals achieved: Yes  Skilled therapeutic interventions:  Time: 0915-1000 Time Calculation: 45 minutes. Pt engaged in 1:1 self-care session focused on sequencing, attention, working memory, functional transfers/mobility.  Pt eager to begin session upon entry to room, had Chosen correct clothing garments and gathered supplies for bathing prior to session.  Propelled w/c to ADL apartment with no verbal cues, sequenced bathing tasks independently and demonstrated basic functional problem solving.  Min verbal cues for sequencing dressing tasks. LOB x1 during clothing management in standing position, able to self-correct. Groomed at sink in room with mod I while seated in w/c, pt identified all tasks and needed to complete, gathered materials, and carried out with no verbal cues or safety concerns.  Increased usage of correct words in functional conversation.  Sustained attention noted throughout tasks.  Demonstrated intact working memory throughout session.    OT Discharge    Vision/Perception  Vision - History Baseline Vision: No visual deficits Patient Visual Report: Blurring of vision (pt occasionally reports blurry vision) Vision - Assessment Eye Alignment: Within Functional Limits Praxis Praxis: Intact  Cognition Overall Cognitive Status: Impaired/Different from baseline Arousal/Alertness: Awake/alert Orientation Level: Oriented X4 Attention: Selective Focused Attention: Appears intact Selective Attention: Appears intact Memory: Impaired Memory Impairment: Decreased short term memory Decreased Short Term Memory: Verbal basic Awareness: Impaired Awareness Impairment: Emergent impairment Problem Solving: Impaired Problem Solving Impairment: Functional complex Self Monitoring: Appears intact Self Correcting: Appears intact Safety/Judgment: Appears intact Comments: Significantly improved cognition as compared to evaluation Sensation Sensation Light Touch: Appears Intact (Rt. LE, residual limb Lt. LE) Hot/Cold: Appears Intact Proprioception: Appears Intact Coordination Gross Motor Movements are Fluid and Coordinated: Yes Fine Motor Movements are Fluid and Coordinated: Yes Motor  Motor Motor: Within Functional Limits Mobility  Bed Mobility Bed Mobility: Supine to Sit Supine to Sit: 6: Modified independent (Device/Increase time) Sit to Supine: 6: Modified independent (Device/Increase time) Transfers Transfers: Sit to Stand;Stand to Sit Sit to Stand: 6: Modified independent (Device/Increase time) Stand to Sit: 6: Modified independent (Device/Increase time)  Trunk/Postural Assessment  Cervical Assessment Cervical Assessment: Within Functional Limits Thoracic Assessment Thoracic Assessment: Within Functional Limits Lumbar Assessment Lumbar Assessment: Within  Functional Limits Postural Control Postural Control: Within Functional Limits  Balance Balance Balance Assessed: Yes Static Sitting Balance Static Sitting - Level of Assistance: 7: Independent Dynamic Sitting Balance Dynamic Sitting - Level of Assistance: 7: Independent Dynamic Standing Balance Dynamic Standing - Balance Support: During functional activity Dynamic Standing - Level of Assistance: 5: Stand by assistance Extremity/Trunk Assessment RUE Assessment RUE Assessment: Within Functional  Limits LUE Assessment LUE Assessment: Within Functional Limits  See FIM for current functional status  Kandis Ban 01/23/2013, 3:24 PM

## 2013-01-23 NOTE — Progress Notes (Signed)
Physical Therapy Discharge Summary  Patient Details  Name: Frances Finley MRN: [id number] Date of Birth: [birth year]  Today's Date: 01/23/2013 Time: 1000-1040 Time Calculation (min): 40 min  Session focused on family education. Daughter in-law present, able to provide appropriate supervision for ambulation 2 x 200' with RW, 12 steps, and car transfer pt moving great today. Pt modified independent for squat pivot/stand pivot transfers to/from wheelchair and for wheelchair mobility. Discussed when to call for assist or return to hospital if pt falls, discussed always having supervision with ambulation until HHPT clears pt to be modified independent, allowing extra time with all tasks, and simple methods to avoid falls in the home. Discussed importance of implementing a walking plan for generalized health and balance >/= 3 x a week on level surface such as at a store, etc. Reinforced need for supervision with all ambulation. Pt excited to go home.  Patient has met 9 of 9 long term goals due to improved activity tolerance, improved balance, improved postural control, increased strength, ability to compensate for deficits, improved attention and improved awareness.  Pt has made great progress while here progressing from ambulation x 3' at mod assist level to walking ~200' with RW and prosthesis  At supervision level with huge improvements in receptive and expressive aphasias. Patient to discharge at an ambulatory level Supervision, modified independent wheelchair level.   [name] care partner is independent to provide the necessary physical and cognitive assistance at discharge.  Reasons goals not met: NA  Recommendation:  Patient will benefit from ongoing skilled PT services in home health setting to continue to advance safe functional mobility, address ongoing impairments in dynamic standing balance, reduce need for restrictive assistive devices, increase functional mobility, and minimize fall  risk.  Equipment: No equipment provided  Reasons for discharge: treatment goals met and discharge from hospital  Patient/family agrees with progress made and goals achieved: Yes  PT Discharge Precautions/Restrictions Restrictions Weight Bearing Restrictions: No Pain Pt reports some residual limb pain at end of session, RN made aware.  Vision/Perception  Vision - History Baseline Vision: No visual deficits Patient Visual Report: Blurring of vision (pt occasionally reports blurry vision) Vision - Assessment Eye Alignment: Within Functional Limits Praxis Praxis: Intact  Cognition Overall Cognitive Status: Impaired/Different from baseline Arousal/Alertness: Awake/alert Orientation Level: Oriented X4 Attention: Selective Focused Attention: Appears intact Selective Attention: Appears intact Memory: Impaired Memory Impairment: Decreased short term memory Decreased Short Term Memory: Verbal basic Awareness: Impaired Awareness Impairment: Emergent impairment Problem Solving: Impaired Problem Solving Impairment: Functional complex Self Monitoring: Appears intact Self Correcting: Appears intact Self Correcting Impairment: Verbal basic Safety/Judgment: Appears intact Comments: Significantly improved cognition as compared to evaluation Sensation Sensation Light Touch: Appears Intact (Rt. LE, residual limb Lt. LE) Hot/Cold: Appears Intact Proprioception: Appears Intact Coordination Gross Motor Movements are Fluid and Coordinated: Yes Fine Motor Movements are Fluid and Coordinated: Yes Motor  Motor Motor: Within Functional Limits  Mobility Bed Mobility Bed Mobility: Supine to Sit Supine to Sit: 6: Modified independent (Device/Increase time) Sit to Supine: 6: Modified independent (Device/Increase time) Transfers Sit to Stand: 6: Modified independent (Device/Increase time) Stand to Sit: 6: Modified independent (Device/Increase time) Stand Pivot Transfers: 6: Modified  independent (Device/Increase time) Locomotion  Ambulation Ambulation: Yes Ambulation/Gait Assistance: 5: Supervision Ambulation Distance (Feet): 200 Feet Assistive device: Rolling walker Ambulation/Gait Assistance Details: Cues for posture, pt doing well with safety of RW. Gait Gait: Yes Gait Pattern: Step-through pattern;Decreased stride length;Trunk flexed Stairs / Additional Locomotion Stairs: Yes Stairs Assistance: 5: Supervision  Stairs Assistance Details (indicate cue type and reason): supervision for safety Stair Management Technique: Two rails Number of Stairs: 12 Ramp: 5: Supervision Wheelchair Mobility Wheelchair Mobility: Yes Wheelchair Assistance: 6: Modified independent (Device/Increase time) Occupational hygienist: Both upper extremities Wheelchair Parts Management: Independent  Trunk/Postural Assessment  Cervical Assessment Cervical Assessment: Within Functional Limits Thoracic Assessment Thoracic Assessment: Within Functional Limits Lumbar Assessment Lumbar Assessment: Within Functional Limits Postural Control Postural Control: Within Functional Limits  Balance Balance Balance Assessed: Yes Static Sitting Balance Static Sitting - Level of Assistance: 7: Independent Dynamic Standing Balance Dynamic Standing - Balance Support: During functional activity Dynamic Standing - Level of Assistance: 5: Stand by assistance Extremity Assessment  RUE Assessment RUE Assessment: Within Functional Limits LUE Assessment LUE Assessment: Within Functional Limits RLE Assessment RLE Assessment: Within Functional Limits (4+/5) LLE Assessment LLE Assessment: Within Functional Limits LLE Strength LLE Overall Strength Comments: grossly 4-/5 strength of residual musculature.   See FIM for current functional status  Wilhemina Bonito 01/23/2013, 12:11 PM

## 2013-01-23 NOTE — Progress Notes (Signed)
Patient ID: Frances Finley, female   DOB: 08-Nov-1955, 57 y.o.   MRN: [id number] Subjective/Complaints: Headache much better after topamax added A 12 point review of systems has been performed and if not noted above is otherwise negative.   Objective: Vital Signs: Blood pressure 117/78, pulse 84, temperature 98 F (36.7 C), temperature source Oral, resp. rate 18, weight 92.2 kg (203 lb 4.2 oz), SpO2 97.00%. No results found. No results found for this basename: WBC, HGB, HCT, PLT,  in the last 72 hours No results found for this basename: NA, K, CL, CO, GLUCOSE, BUN, CREATININE, CALCIUM,  in the last 72 hours CBG (last 3)  No results found for this basename: GLUCAP,  in the last 72 hours  Wt Readings from Last 3 Encounters:  01/18/13 92.2 kg (203 lb 4.2 oz)  01/09/13 95 kg (209 lb 7 oz)    Physical Exam:  Gen: pt is easy to arouse , oriented to self Head: Normocephalic.  Eyes: EOM are normal.  No nystagmus  Neck: Normal range of motion. Neck supple. No thyromegaly present.  Cardiovascular: Normal rate and regular rhythm. No murmurs  Pulmonary/Chest: Effort normal and breath sounds normal. No respiratory distress. No wheezes  Abdominal: Soft. Bowel sounds are normal. She exhibits no distension. Mild pain with deep palpation LLQ. No rebound  Musculoskeletal:  Left BKA. Limb well-formed, non-tender Neurological:  Moved all 4's without focal weakness.  Sensed pain in all 4's. Could answer simple questions. Better comprehension. Language content better, speaking in full sentences. Gaze is dysconjugate Skin: Skin is warm and dry.  Psychiatric:  alert   Assessment/Plan: 1. Functional deficits secondary to bilateral [place] which require 3+ hours per day of interdisciplinary therapy in a comprehensive inpatient rehab setting. Physiatrist is providing close team supervision and 24 hour management of active medical problems listed below. Physiatrist and rehab team continue to assess  barriers to discharge/monitor patient progress toward functional and medical goals.  Home today after therapies.   FIM: FIM - Bathing Bathing Steps Patient Completed: Chest;Right upper leg;Right Arm;Left upper leg;Left Arm;Right lower leg (including foot);Abdomen;Front perineal area;Buttocks Bathing: 6: More than reasonable amount of time  FIM - Upper Body Dressing/Undressing Upper body dressing/undressing steps patient completed: Thread/unthread right sleeve of pullover shirt/dresss;Thread/unthread left sleeve of pullover shirt/dress;Put head through opening of pull over shirt/dress;Pull shirt over trunk Upper body dressing/undressing: 6: More than reasonable amount of time FIM - Lower Body Dressing/Undressing Lower body dressing/undressing steps patient completed: Thread/unthread right pants leg;Thread/unthread left pants leg;Pull pants up/down;Fasten/unfasten pants Lower body dressing/undressing: 6: More than reasonable amount of time  FIM - Toileting Toileting steps completed by patient: Adjust clothing prior to toileting;Performs perineal hygiene;Adjust clothing after toileting Toileting Assistive Devices: Grab bar or rail for support Toileting: 6: Assistive device: No helper  FIM - Diplomatic Services operational officer Devices: Grab bars;Prosthesis Toilet Transfers: 6-Assistive device: No helper (mod I )  FIM - Banker Devices: Walker;Prosthesis Bed/Chair Transfer: 6: Supine > Sit: No assist;5: Bed > Chair or W/C: Supervision (verbal cues/safety issues)  FIM - Locomotion: Wheelchair Locomotion: Wheelchair: 0: Activity did not occur FIM - Locomotion: Ambulation Locomotion: Ambulation Assistive Devices: Patent attorney - Rolling Ambulation/Gait Assistance: 4: Min guard Locomotion: Ambulation: 2: Travels 50 - 149 ft with supervision/safety issues  Comprehension Comprehension Mode: Auditory Comprehension: 5-Follows basic  conversation/direction: With extra time/assistive device  Expression Expression Mode: Verbal Expression: 5-Expresses basic needs/ideas: With no assist  Social Interaction Social Interaction: 6-Interacts appropriately with others  with medication or extra time (anti-anxiety, antidepressant).  Problem Solving Problem Solving: 5-Solves basic problems: With no assist  Memory Memory: 6-More than reasonable amt of time  Medical Problem List and Plan:  1. Bilateral subdural hematomas after recent/remote fall  2. DVT Prophylaxis/Anticoagulation: SCDs. Monitor for signs of DVT. History of DVT and pulmonary emboli. Coumadin discontinued secondary to subdural hematoma  3. Pain Management: Neurontin 600 mg twice a day, Tylenol as needed. Monitor with increased mobility. Patient was taking OxyContin sustained-release 10 mg 3 times a day prior to admission.   -low dose topamax helpful for headaches, continue gabapentin as well for the time being 4. Mood/anxiety. Valium 5 mg every 6 hours as needed--limit as possible given neuro-cognitive deficits  5. Neuropsych: This patient is not capable of making decisions on her own behalf.  6. Seizure prophylaxis. Keppra 500 mg twice a day. Will slowly wean as an outpt--continue same dose for time being. 7. 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 Hypothyroidism. Synthroid  9. Hypertension. Lasix 40 mg twice a day, hydrochlorothiazide 25 mg daily. Controlled at present 10. Hypokalemia. Continue supplement and followup labs--continue to replace 11. Leukocytosis--?reactive--afebrile, no signs of infection 12. Hemochromatosis--may be behind her increased hemoglobin---has been elevated it appears chronically.  -recheck as an outpt  LOS (Days) 13 A FACE TO FACE EVALUATION WAS PERFORMED  SWARTZ,ZACHARY T 01/23/2013 8:02 AM

## 2013-01-23 NOTE — Progress Notes (Signed)
This note has been reviewed and this clinician agrees with information provided.  

## 2013-01-23 NOTE — Discharge Summary (Signed)
Frances Finley, Frances Finley             ACCOUNT NO.:  [id number]  MEDICAL RECORD NO.:  [id number]  LOCATION:  [id number]                        FACILITY:  [facility]  PHYSICIAN:  Ranelle Oyster, M.D.DATE OF BIRTH:  29-Nov-1955  DATE OF ADMISSION:  01/10/2013 DATE OF DISCHARGE:  01/23/2013                              DISCHARGE SUMMARY   DISCHARGE DIAGNOSES: 1. Bilateral subdural hematomas after recent remote fall. 2. Sequential compression devices for deep vein thrombosis     prophylaxis. 3. Pain management. 4. Anxiety. 5. Seizure prophylaxis. 6. History of left below-knee amputation. 7. Hypothyroidism. 8. Hypertension. 9. Hypokalemia. 10.Hemochromatosis.  HISTORY OF PRESENT ILLNESS:  This is a 57 year old right-handed female with history of a left below-knee amputation in 2003, DVT and pulmonary emboli maintained on Coumadin x10 years who was on admitted January 05, 2013, with progressive change in mental status, denying any recent trauma.  Cranial CT scan showed acute bilateral subdural hematomas, right greater than left.  Followup Neurosurgery Dr. Marikay Alar.  INR on admission 2.15 and was reversed.  Advised conservative care of subdural hematoma with serial followup cranial CT scans that remained stable. Followup CT of the brain January 09, 2013, with no new hemorrhage.  No change in the approximately 3-mm midline shift, left to right.  Keppra was added for seizure prophylaxis.  Physical and occupational therapy ongoing.  The patient was admitted for a comprehensive rehab program.  PAST MEDICAL HISTORY:  See discharge diagnoses.  SOCIAL HISTORY:  Lives with spouse.  FUNCTIONAL HISTORY:  Prior to admission, independent with prosthesis. Functional status upon admission to rehab services, +2 total assist for stand pivot transfers.  PHYSICAL EXAMINATION:  VITAL SIGNS:  Blood pressure 146/59, pulse 83, temperature 97.5, respirations 23. GENERAL:  This was an alert female, in no  acute distress.  She was inconsistent to follow commands.  She could answer simple questions with mostly jargon. LUNGS:  Clear to auscultation. CARDIAC:  Regular rate and rhythm. ABDOMEN:  Soft, nontender.  Good bowel sounds.  REHABILITATION HOSPITAL COURSE:  The patient was admitted to inpatient rehab services with therapies initiated on a 3-hour daily basis consisting of physical therapy, occupational therapy, speech therapy, and rehabilitation nursing.  The following issues were addressed during the patient's rehabilitation stay.  Pertaining to Ms. Brickel's bilateral subdural hematoma, remained stable, serial followup cranial CT scans per Neurosurgery, Dr. Marikay Alar with conservative care.  She did have a history of DVT pulmonary emboli on Coumadin therapy prior to admission, this was discontinued due to subdural hematoma and followed with no bleeding episodes noted.  She remained on Neurontin as needed for pain management as well as Topamax for headaches with oxycodone for breakthrough pain.  She was maintained on Keppra for seizure prophylaxis.  No seizure activity noted.  She had a history of left below-knee amputation in 2003.  She was using her prosthesis.  She remained on hormone supplement for hypothyroidism.  Her blood pressures remained well controlled on Lasix as well as hydrochlorothiazide.  Close monitoring of hemoglobin.  [place] CBC of 40981, questioned hemochromatosis appears to be chronic from followup CBCs in the past. The patient received weekly collaborative interdisciplinary team conferences to discuss estimated length of stay, family  teaching, barriers to discharge.  She is maintained on a mechanical soft diet. Supervision set up for activities of daily living and minimal assist for lower body activities of daily living, supervision with transfers and ambulation, some increased to min assist with increased difficulty.  She was able to communicate basic needs;  however, she did speak some jargon, however, this had greatly improved as she was 75% accurate on communicating her needs.  Full family teaching was completed and plan was to be discharged to home.  DISCHARGE MEDICATIONS:  At the time of dictation included: 1. Lasix 40 mg p.o. b.i.d. 2. Neurontin 600 mg p.o. b.i.d. 3. Hydrochlorothiazide 25 mg p.o. daily. 4. Keppra 500 mg p.o. b.i.d. 5. Synthroid 100 mcg p.o. daily. 6. Oxycodone immediate release 5-10 mg every 4 hours as needed for     severe pain, dispense of 90 tablets. 7. Protonix 40 mg p.o. at bedtime. 8. Potassium chloride 20 mEq p.o. b.i.d. 9. Topamax 25 mg p.o. at bedtime.  DIET:  Mechanical soft.  SPECIAL INSTRUCTIONS:  The patient would follow up with Dr. Faith Rogue at the outpatient rehab service office as advised.  Dr. Marikay Alar, Neurosurgery, 2 weeks call for appointment.  Dr. Aida Puffer, medical management.     Mariam Dollar, P.A.   ______________________________ Ranelle Oyster, M.D.    DA/MEDQ  D:  01/22/2013  T:  01/23/2013  Job:  [id number]  cc:   Aida Puffer, MD Tia Alert, MD

## 2013-01-23 NOTE — Progress Notes (Signed)
Speech Language Pathology Session Note & Discharge Summary  Patient Details  Name: Frances Finley MRN: [id number] Date of Birth: 09-Jun-1955  Today's Date: 01/23/2013 Time: 7829-5621 Time Calculation (min): 25 min  Skilled Therapeutic Intervention: Treatment focus on completion of pt/family education. Pt's daughter present and educated on strategies to utilize at home to increase working memory and overall verbal expression. Pt's daughter verbalized understanding and reported no questions at this time.   Patient has met 6 of 6 long term goals.  Patient to discharge at overall Supervision level.   Reasons goals not met: N/A   Clinical Impression/Discharge Summary:  Pt has made functional gains and has met 6 of 6 LTG's this admission. Currently, pt is consuming regular textures with thin liquids without overt s/s of aspiration and is Mod I for utilization of swallowing compensatory strategies. Pt 's overall cognitive-linguistic function has improved and pt requires supervision verbal and phonemic cues for verbal expression at the sentence level , reading comprehension at the paragraph level and written expression with biographical information.  Pt also requires supervision verbal and question cues for working memory with newly learned information. Pt/family education complete. Pt would benefit from f/u skilled SLP intervention to maximize cognitive-linguistic function and overall functional independence.   Care Partner:  Caregiver Able to Provide Assistance: Yes  Type of Caregiver Assistance: Physical;Cognitive  Recommendation:  Home Health SLP;24 hour supervision/assistance  Rationale for SLP Follow Up: Maximize functional communication;Maximize cognitive function and independence   Equipment: N/A   Reasons for discharge: Treatment goals met;Discharged from hospital   Patient/Family Agrees with Progress Made and Goals Achieved: Yes   See FIM for current functional status  Frances Finley,  Frances Finley 01/23/2013, 12:15 PM

## 2013-01-23 NOTE — Progress Notes (Signed)
Social Work  Discharge Note  The overall goal for the admission was met for:   Discharge location: Yes - home with husband and daughter to provide 24/7 supervision  Length of Stay: Yes - 13 days  Discharge activity level: Yes - supervision  Home/community participation: Yes  Services provided included: MD, RD, PT, OT, SLP, RN, TR, Pharmacy, Neuropsych and SW  Financial Services: Medicare (*Blue Medicare)  Follow-up services arranged: Home Health: PT, OT, ST via [facility] and Patient/Family has no preference for HH/DME agencies  Comments (or additional information):  Patient/Family verbalized understanding of follow-up arrangements: Yes  Individual responsible for coordination of the follow-up plan: husband  Confirmed correct DME delivered: NA  Jayvion Stefanski, LCSW

## 2013-01-23 NOTE — Progress Notes (Signed)
Pt discharged  Home with husband, Harvel Ricks PA provided discharge instructions and prescriptions, pt and husband verbalized an understanding and denied any questions and concerns

## 2013-01-25 ENCOUNTER — Telehealth: Payer: Self-pay

## 2013-01-25 NOTE — Telephone Encounter (Signed)
Kathy(Speech Path.) is requesting a verbal order after evaluating patient in home.... Speech therapy 2 times a week for 1 week, 3 times a week for 2 weeks, and 2 times a week for 1 week to work on word finding and reading comprehension. Kathy's contact number is [phone].

## 2013-01-25 NOTE — Telephone Encounter (Signed)
Verbal order given  

## 2013-02-15 ENCOUNTER — Other Ambulatory Visit: Payer: Self-pay | Admitting: Neurosurgery

## 2013-02-15 DIAGNOSIS — S065X9A Traumatic subdural hemorrhage with loss of consciousness of unspecified duration, initial encounter: Secondary | ICD-10-CM

## 2013-02-17 ENCOUNTER — Encounter: Payer: Medicare Other | Admitting: Physical Medicine & Rehabilitation

## 2013-02-20 ENCOUNTER — Ambulatory Visit
Admission: RE | Admit: 2013-02-20 | Discharge: 2013-02-20 | Disposition: A | Discharge status: Home / Self Care | Payer: Medicare Other | Source: Ambulatory Visit | Attending: Neurosurgery | Admitting: Neurosurgery

## 2013-02-20 DIAGNOSIS — S065X9A Traumatic subdural hemorrhage with loss of consciousness of unspecified duration, initial encounter: Secondary | ICD-10-CM

## 2013-02-20 NOTE — Progress Notes (Signed)
This note has been reviewed and this clinician agrees with information provided.  

## 2013-03-21 ENCOUNTER — Telehealth: Payer: Self-pay

## 2013-03-21 NOTE — Telephone Encounter (Signed)
Refill request from [facility] for Keppra 500mg  bid #[id number].  Please advise.

## 2013-03-22 MED ORDER — LEVETIRACETAM 500 MG PO TABS: 500.0000 mg | ORAL_TABLET | Freq: Two times a day (BID) | ORAL | Status: DC

## 2013-03-22 NOTE — Telephone Encounter (Signed)
done

## 2013-03-24 ENCOUNTER — Other Ambulatory Visit: Payer: Self-pay

## 2013-03-24 MED ORDER — LEVETIRACETAM 500 MG PO TABS: 500.0000 mg | ORAL_TABLET | Freq: Two times a day (BID) | ORAL | Status: DC

## 2013-04-06 ENCOUNTER — Telehealth: Payer: Self-pay

## 2013-04-06 NOTE — Telephone Encounter (Signed)
Gabapentin 600mg  1 tablet bid request from [facility].  Please advise.

## 2013-04-10 MED ORDER — GABAPENTIN 600 MG PO TABS: 600.0000 mg | ORAL_TABLET | Freq: Two times a day (BID) | ORAL | Status: DC

## 2013-04-10 NOTE — Telephone Encounter (Signed)
gabpentin refilled. thanks

## 2014-08-27 IMAGING — CT CT HEAD W/O CM
2 series · 16 of 30 positions shown, 20 images · non-contrast
Comparison: [DATE] and 01/05/2013

CLINICAL DATA: Bilateral subdural hematomas.

CT HEAD WITHOUT CONTRAST
TECHNIQUE: Contiguous axial images were obtained from the base of
the skull through the vertex without contrast.

[Series 2: head w/o · axial · non-contrast · 0.49mm/px · z∈[+60,+200]mm · 13 of 34 slices shown, 17 images]
[im 3/34  brain]
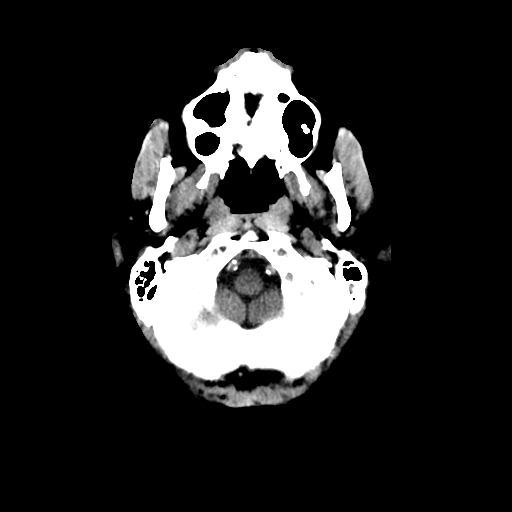
[im 3/34  bone]
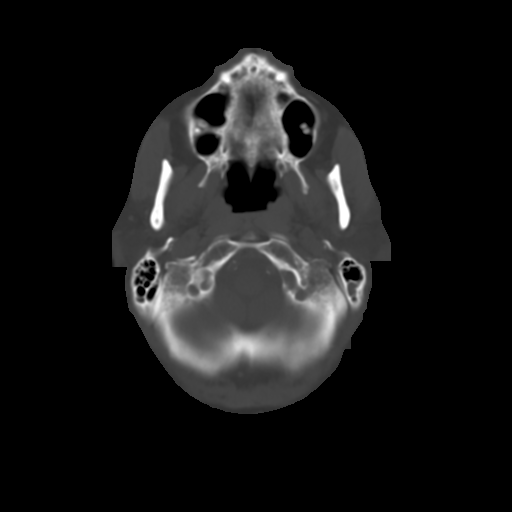
[im 5/34  brain]
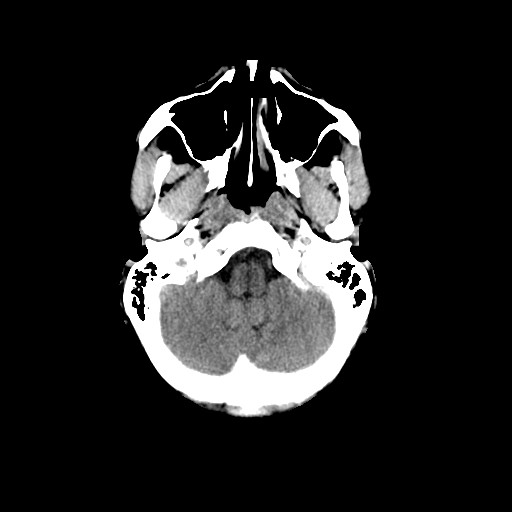
[im 8/34  brain]
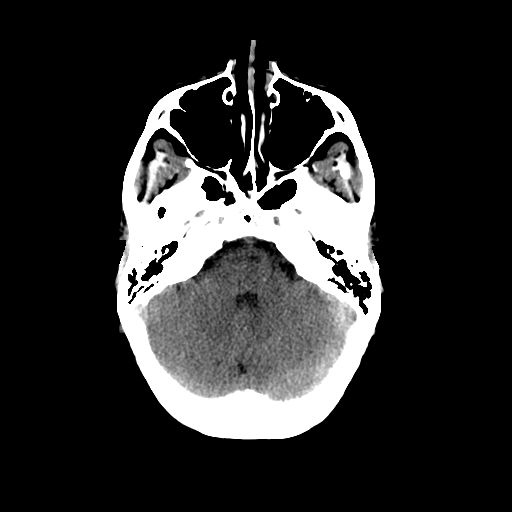
[im 10/34  brain]
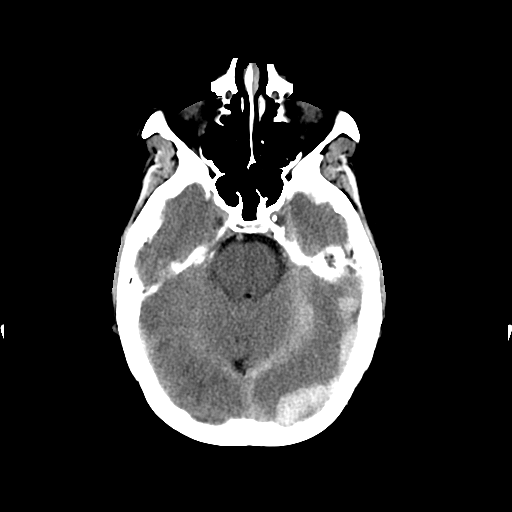
[im 12/34  brain]
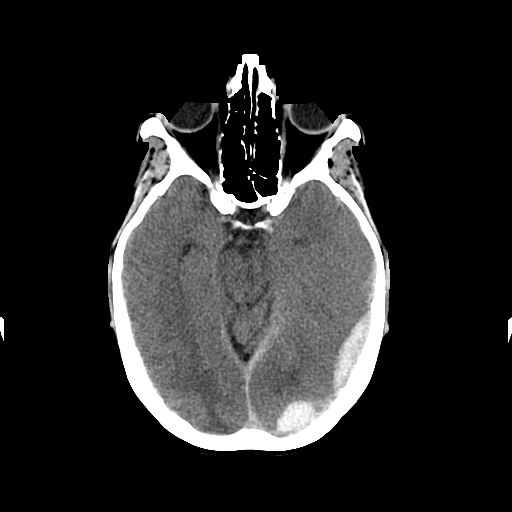
[im 12/34  bone]
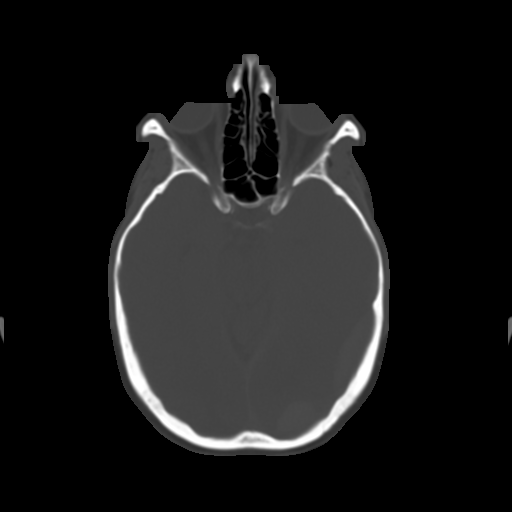
[im 15/34  brain]
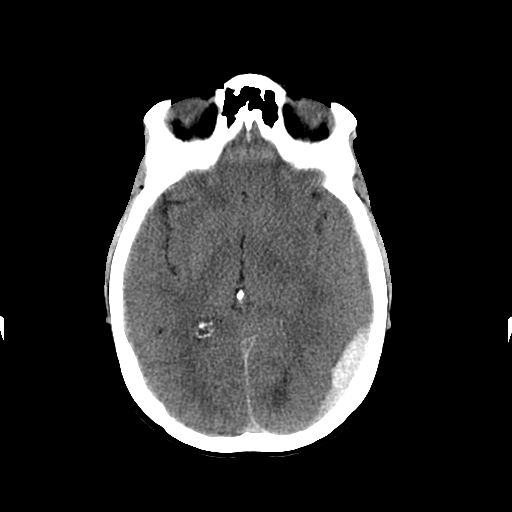
[im 17/34  brain]
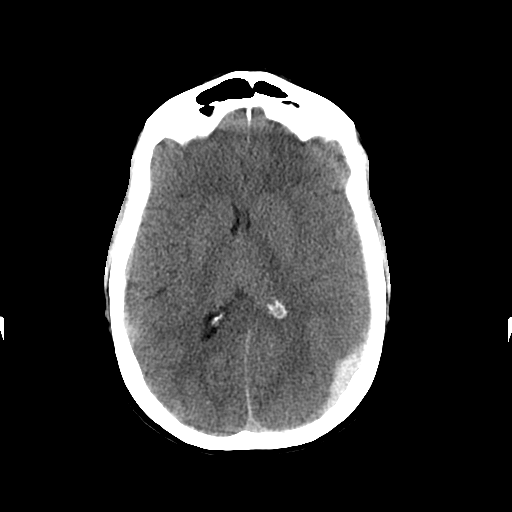
[im 19/34  brain]
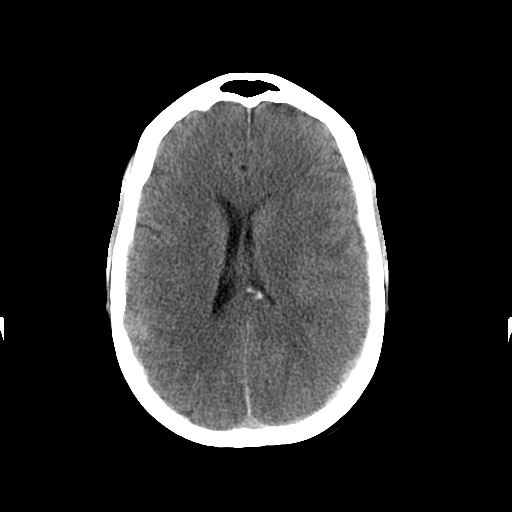
[im 22/34  brain]
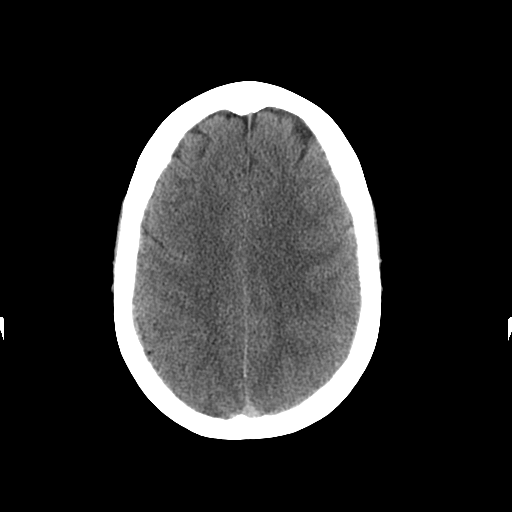
[im 22/34  bone]
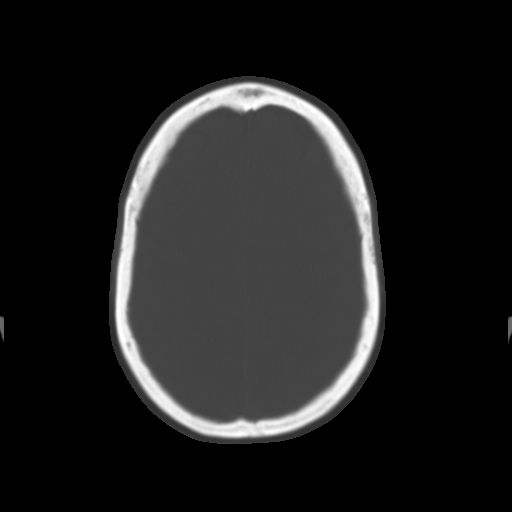
[im 24/34  brain]
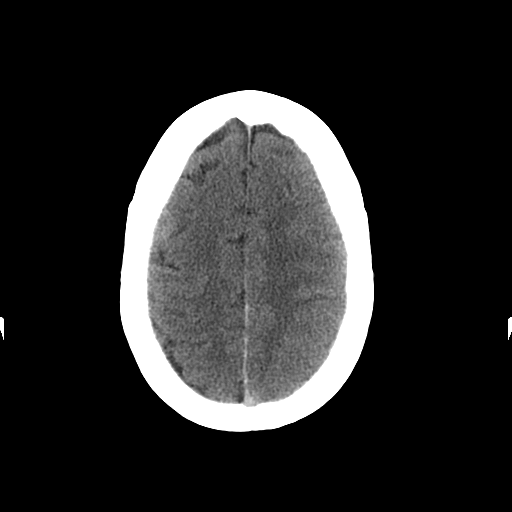
[im 26/34  brain]
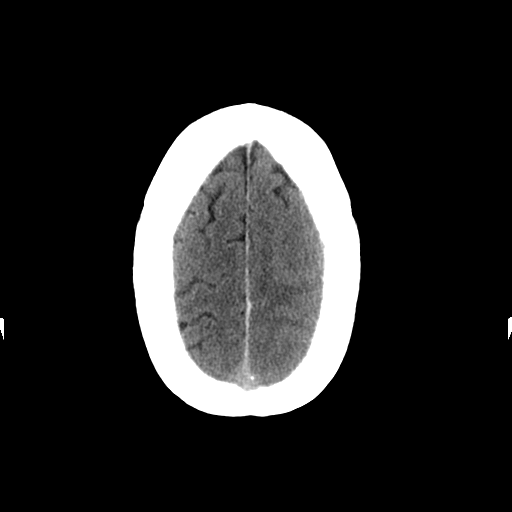
[im 29/34  brain]
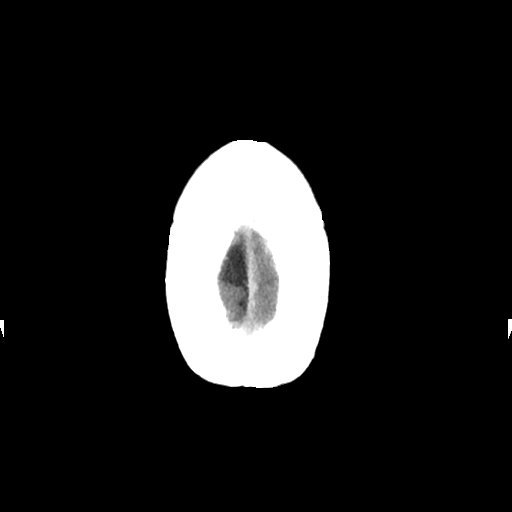
[im 31/34  brain]
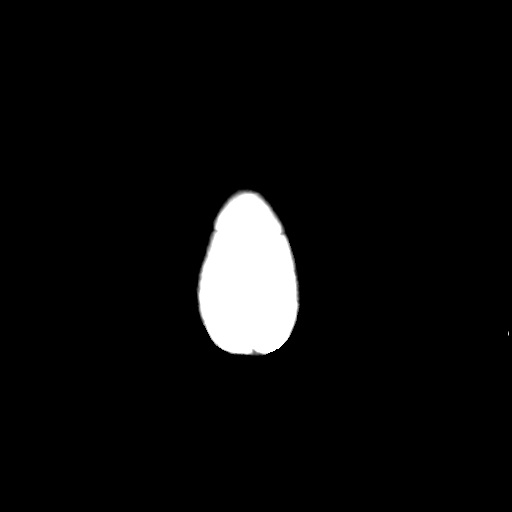
[im 31/34  bone]
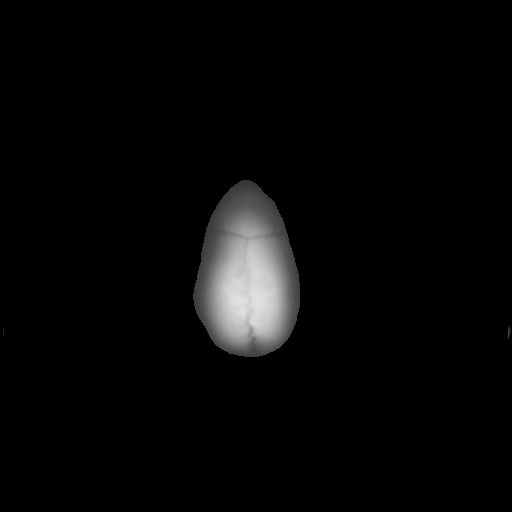

[Series 3: head w/o bone · axial · non-contrast · 0.49mm/px · z∈[+60,+105]mm · 3 of 34 slices shown]
[im 3/34  bone]
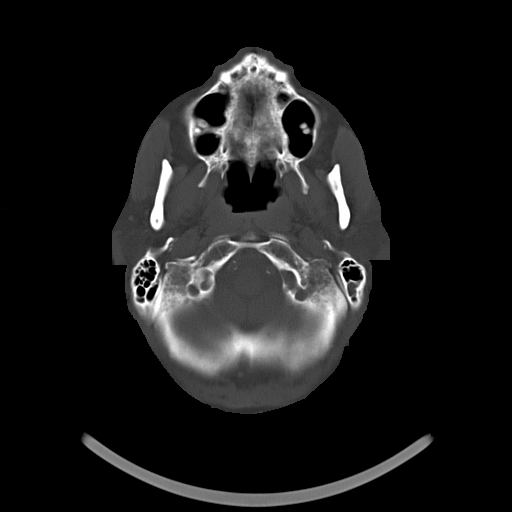
[im 8/34  bone]
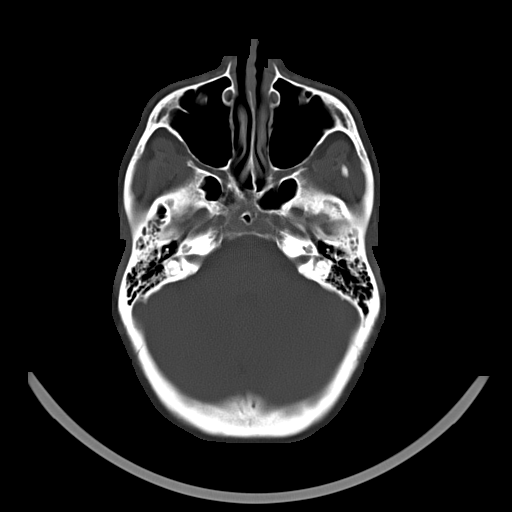
[im 12/34  bone]
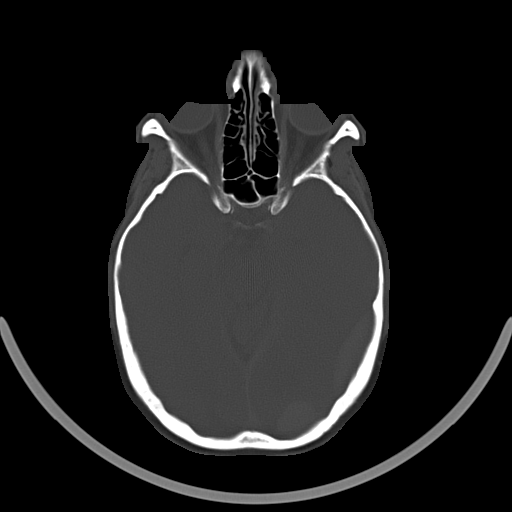

[16 of 30 positions shown; findings below may reference images not displayed]

FINDINGS: There is no new intracranial hemorrhage.  No change in
the approximately 3 mm midline shift from left to right.  The
subdural hematomas on the right and left are slightly less apparent
consistent with aging of the blood.  No osseous abnormality.
IMPRESSION: 1.  Aging of the bilateral subdural hematomas.  No new hemorrhage.
2.  Stable slight midline shift from left to right.

## 2014-10-08 IMAGING — CT CT HEAD W/O CM
2 series · 16 of 30 positions shown, 18 images · non-contrast
Comparison: 01/09/2013

CLINICAL DATA: Followup subdural hematoma

EXAM:
CT HEAD WITHOUT CONTRAST
TECHNIQUE: Contiguous axial images were obtained from the base of the skull
through the vertex without intravenous contrast.

[Series 3: head bone · axial · 0.49mm/px · z∈[+15,+148]mm · 8 of 64 slices shown]
[im 7/64  bone]
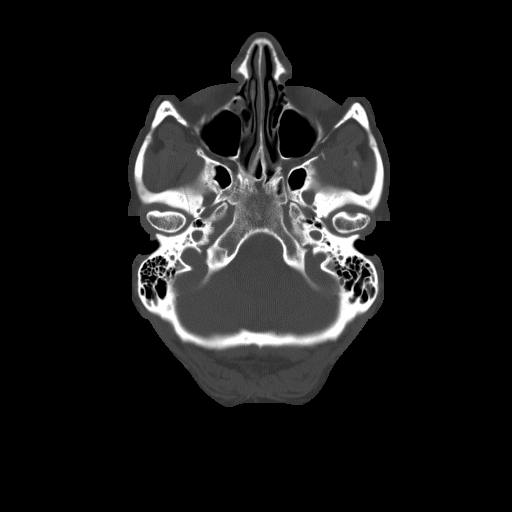
[im 14/64  bone]
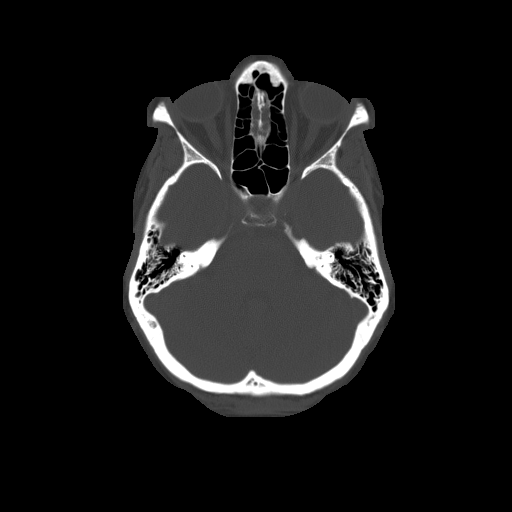
[im 20/64  bone]
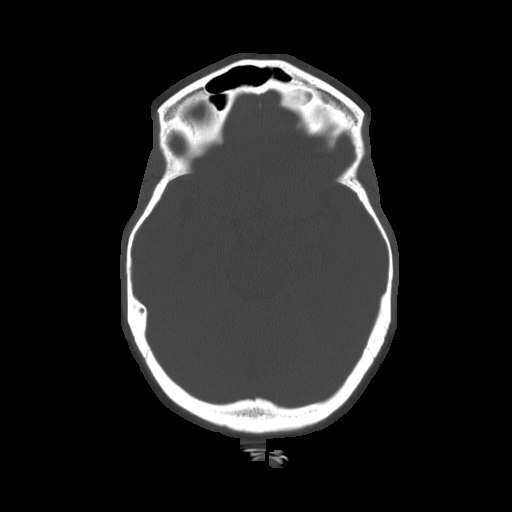
[im 27/64  bone]
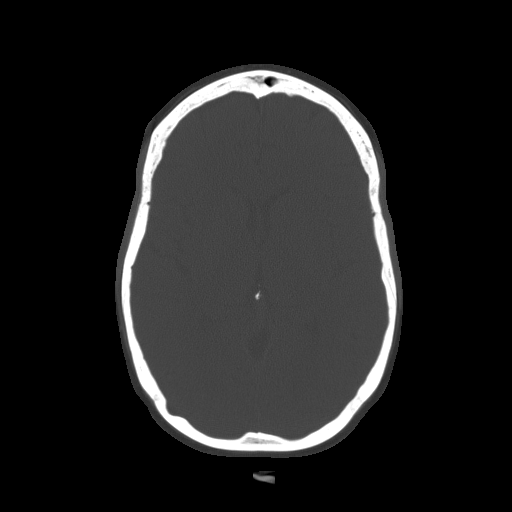
[im 37/64  bone]
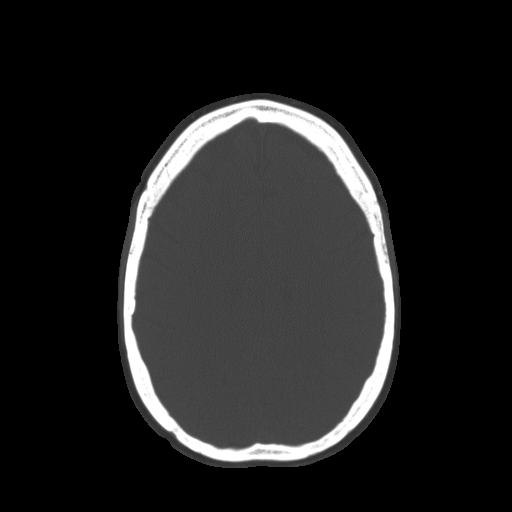
[im 44/64  bone]
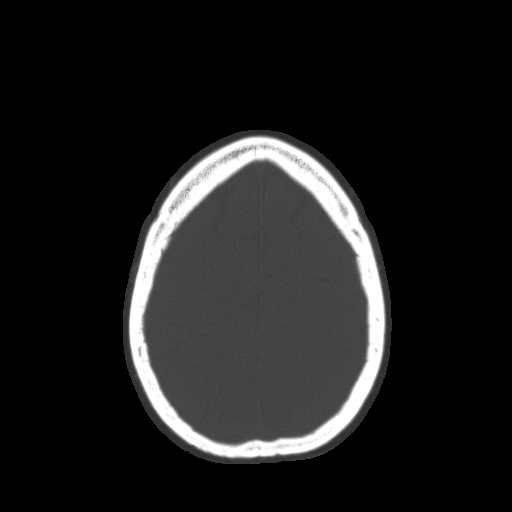
[im 50/64  bone]
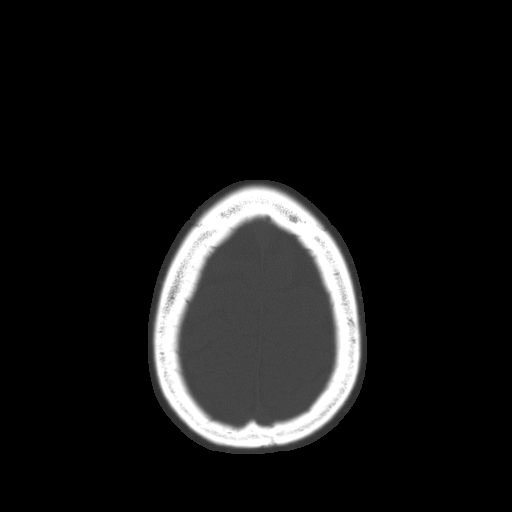
[im 57/64  bone]
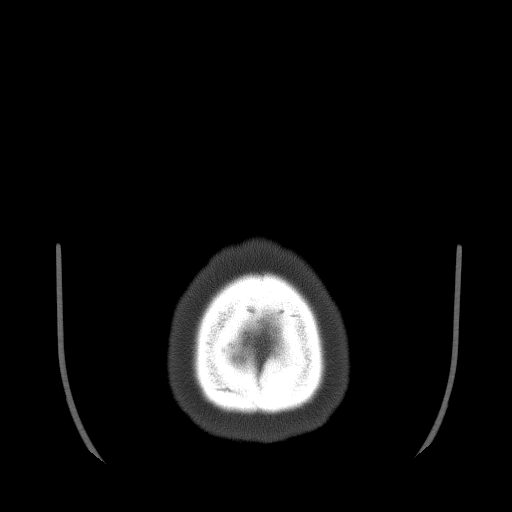

[Series 32: 3d filtered head w/o · axial · non-contrast · 0.49mm/px · z∈[+16,+144]mm · 8 of 32 slices shown, 10 images]
[im 4/32  brain]
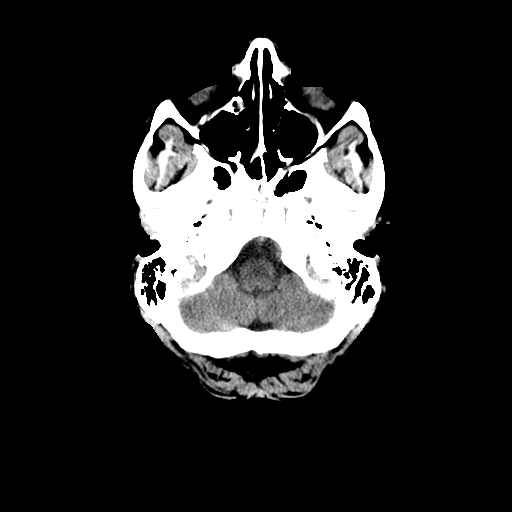
[im 4/32  bone]
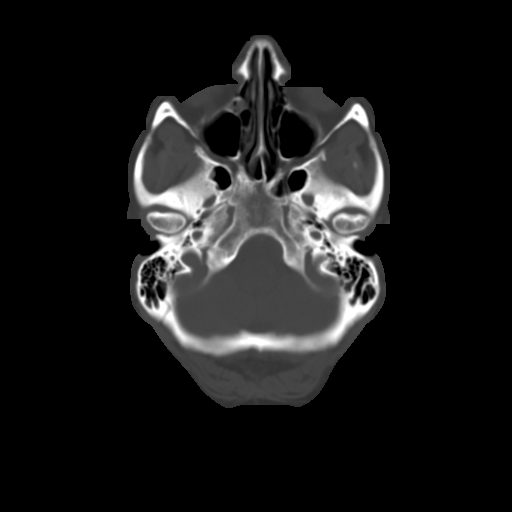
[im 7/32  brain]
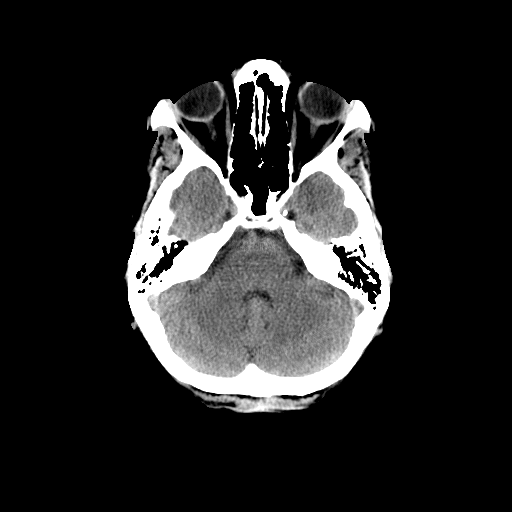
[im 11/32  brain]
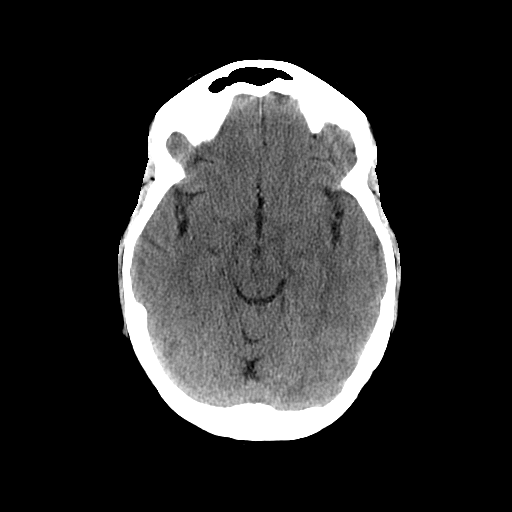
[im 14/32  brain]
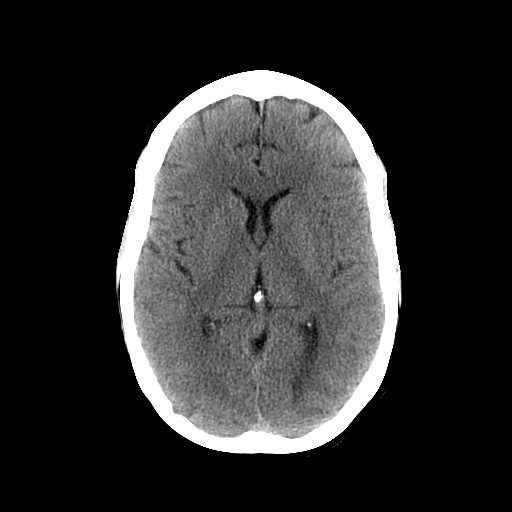
[im 18/32  brain]
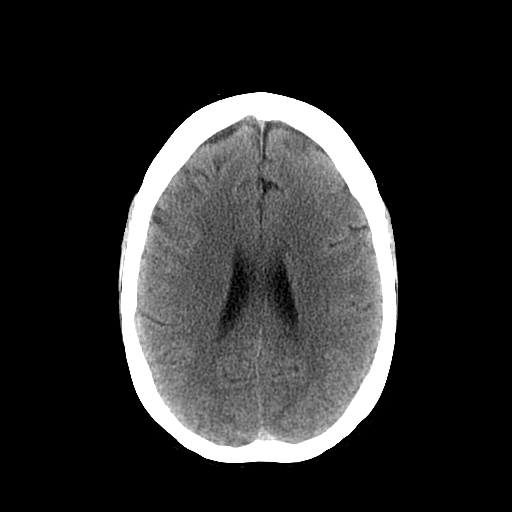
[im 18/32  bone]
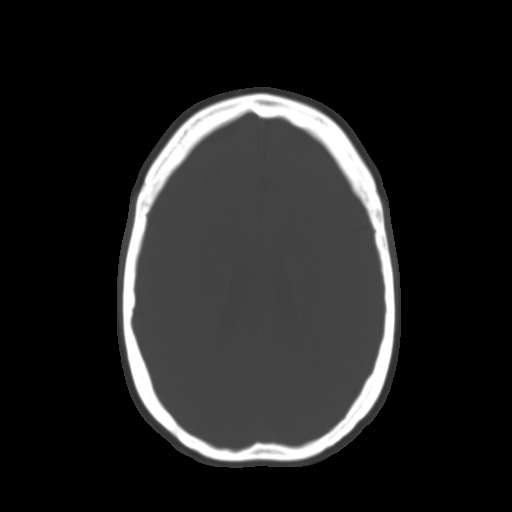
[im 21/32  brain]
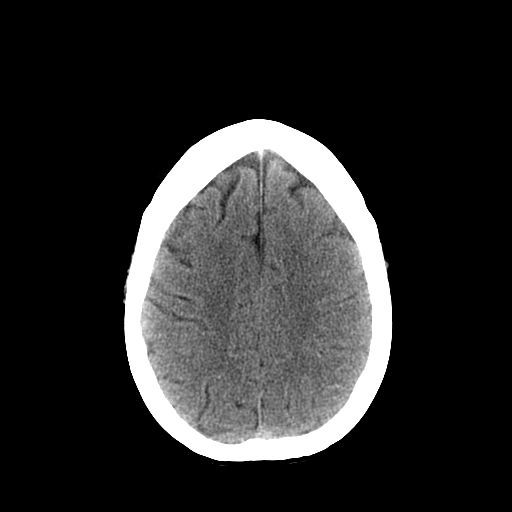
[im 25/32  brain]
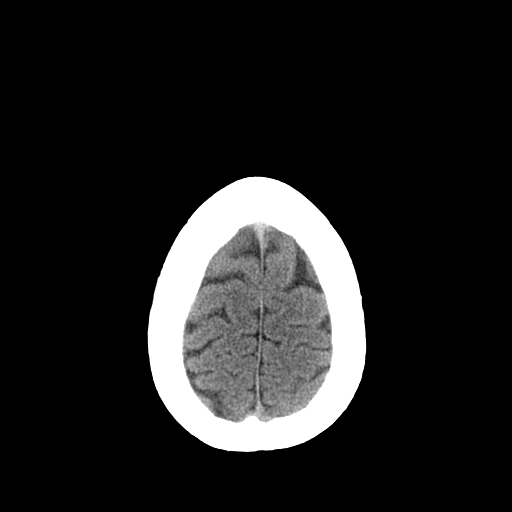
[im 28/32  brain]
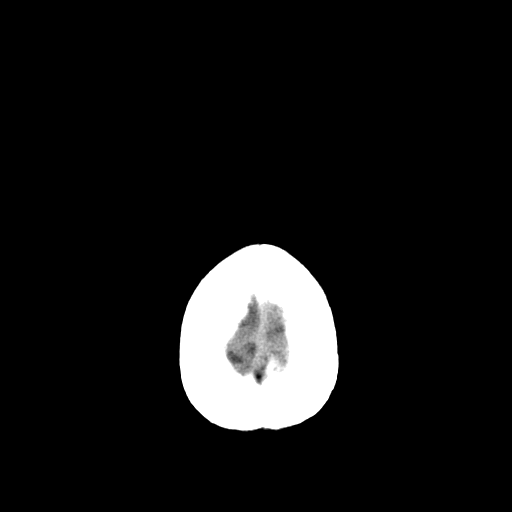

[16 of 30 positions shown; findings below may reference images not displayed]

FINDINGS: No acute cortical infarct, hemorrhage, or mass lesion ispresent.
Ventricles are of normal size. No significant extra-axial fluid
collection is present. The paranasal sinuses andmastoid air cells
are clear. The osseous skull is intact.
IMPRESSION: 1. Normal brain. Interval resolution of previous bilateral subdural
hematomas.

## 2017-07-19 ENCOUNTER — Other Ambulatory Visit: Payer: Self-pay | Admitting: Family Medicine

## 2017-07-20 ENCOUNTER — Other Ambulatory Visit: Payer: Self-pay | Admitting: Family Medicine

## 2017-07-20 DIAGNOSIS — J9611 Chronic respiratory failure with hypoxia: Secondary | ICD-10-CM

## 2017-07-20 DIAGNOSIS — R634 Abnormal weight loss: Secondary | ICD-10-CM

## 2020-06-13 DIAGNOSIS — G8929 Other chronic pain: Secondary | ICD-10-CM | POA: Diagnosis not present

## 2020-07-11 DIAGNOSIS — E039 Hypothyroidism, unspecified: Secondary | ICD-10-CM | POA: Diagnosis not present

## 2020-07-11 DIAGNOSIS — J45909 Unspecified asthma, uncomplicated: Secondary | ICD-10-CM | POA: Diagnosis not present

## 2020-07-11 DIAGNOSIS — Z79891 Long term (current) use of opiate analgesic: Secondary | ICD-10-CM | POA: Diagnosis not present

## 2020-07-11 DIAGNOSIS — M62830 Muscle spasm of back: Secondary | ICD-10-CM | POA: Diagnosis not present

## 2020-07-11 DIAGNOSIS — R3915 Urgency of urination: Secondary | ICD-10-CM | POA: Diagnosis not present

## 2020-07-11 DIAGNOSIS — G894 Chronic pain syndrome: Secondary | ICD-10-CM | POA: Diagnosis not present

## 2020-08-08 DIAGNOSIS — G894 Chronic pain syndrome: Secondary | ICD-10-CM | POA: Diagnosis not present

## 2020-08-08 DIAGNOSIS — Z79899 Other long term (current) drug therapy: Secondary | ICD-10-CM | POA: Diagnosis not present

## 2020-08-08 DIAGNOSIS — R7989 Other specified abnormal findings of blood chemistry: Secondary | ICD-10-CM | POA: Diagnosis not present

## 2020-08-08 DIAGNOSIS — E119 Type 2 diabetes mellitus without complications: Secondary | ICD-10-CM | POA: Diagnosis not present

## 2020-08-08 DIAGNOSIS — Z13228 Encounter for screening for other metabolic disorders: Secondary | ICD-10-CM | POA: Diagnosis not present

## 2020-08-08 DIAGNOSIS — Z13220 Encounter for screening for lipoid disorders: Secondary | ICD-10-CM | POA: Diagnosis not present

## 2020-08-08 DIAGNOSIS — R946 Abnormal results of thyroid function studies: Secondary | ICD-10-CM | POA: Diagnosis not present

## 2020-09-16 DIAGNOSIS — J039 Acute tonsillitis, unspecified: Secondary | ICD-10-CM | POA: Diagnosis not present

## 2020-10-31 DIAGNOSIS — Z79899 Other long term (current) drug therapy: Secondary | ICD-10-CM | POA: Diagnosis not present

## 2020-10-31 DIAGNOSIS — G8929 Other chronic pain: Secondary | ICD-10-CM | POA: Diagnosis not present

## 2020-10-31 DIAGNOSIS — R69 Illness, unspecified: Secondary | ICD-10-CM | POA: Diagnosis not present

## 2020-11-28 DIAGNOSIS — Z79899 Other long term (current) drug therapy: Secondary | ICD-10-CM | POA: Diagnosis not present

## 2020-11-28 DIAGNOSIS — R69 Illness, unspecified: Secondary | ICD-10-CM | POA: Diagnosis not present

## 2020-12-26 DIAGNOSIS — Z79899 Other long term (current) drug therapy: Secondary | ICD-10-CM | POA: Diagnosis not present

## 2020-12-26 DIAGNOSIS — R69 Illness, unspecified: Secondary | ICD-10-CM | POA: Diagnosis not present

## 2020-12-26 DIAGNOSIS — G8929 Other chronic pain: Secondary | ICD-10-CM | POA: Diagnosis not present

## 2021-01-08 DIAGNOSIS — N3000 Acute cystitis without hematuria: Secondary | ICD-10-CM | POA: Diagnosis not present

## 2021-01-08 DIAGNOSIS — R8271 Bacteriuria: Secondary | ICD-10-CM | POA: Diagnosis not present

## 2021-01-23 DIAGNOSIS — M5430 Sciatica, unspecified side: Secondary | ICD-10-CM | POA: Diagnosis not present

## 2021-01-23 DIAGNOSIS — Z79899 Other long term (current) drug therapy: Secondary | ICD-10-CM | POA: Diagnosis not present

## 2021-01-23 DIAGNOSIS — G8929 Other chronic pain: Secondary | ICD-10-CM | POA: Diagnosis not present

## 2021-01-23 DIAGNOSIS — R69 Illness, unspecified: Secondary | ICD-10-CM | POA: Diagnosis not present

## 2021-02-05 DIAGNOSIS — N3000 Acute cystitis without hematuria: Secondary | ICD-10-CM | POA: Diagnosis not present

## 2021-02-20 DIAGNOSIS — R69 Illness, unspecified: Secondary | ICD-10-CM | POA: Diagnosis not present

## 2021-02-20 DIAGNOSIS — R5381 Other malaise: Secondary | ICD-10-CM | POA: Diagnosis not present

## 2021-02-20 DIAGNOSIS — G894 Chronic pain syndrome: Secondary | ICD-10-CM | POA: Diagnosis not present

## 2021-02-21 DIAGNOSIS — Z79899 Other long term (current) drug therapy: Secondary | ICD-10-CM | POA: Diagnosis not present

## 2021-03-19 DIAGNOSIS — N3281 Overactive bladder: Secondary | ICD-10-CM | POA: Diagnosis not present

## 2021-03-19 DIAGNOSIS — N3000 Acute cystitis without hematuria: Secondary | ICD-10-CM | POA: Diagnosis not present

## 2021-03-20 DIAGNOSIS — G8929 Other chronic pain: Secondary | ICD-10-CM | POA: Diagnosis not present

## 2021-03-20 DIAGNOSIS — Z79899 Other long term (current) drug therapy: Secondary | ICD-10-CM | POA: Diagnosis not present

## 2021-03-20 DIAGNOSIS — R69 Illness, unspecified: Secondary | ICD-10-CM | POA: Diagnosis not present

## 2021-04-17 DIAGNOSIS — Z79899 Other long term (current) drug therapy: Secondary | ICD-10-CM | POA: Diagnosis not present

## 2021-04-17 DIAGNOSIS — G8929 Other chronic pain: Secondary | ICD-10-CM | POA: Diagnosis not present

## 2021-04-17 DIAGNOSIS — R69 Illness, unspecified: Secondary | ICD-10-CM | POA: Diagnosis not present

## 2021-05-15 DIAGNOSIS — G8929 Other chronic pain: Secondary | ICD-10-CM | POA: Diagnosis not present

## 2021-05-15 DIAGNOSIS — E669 Obesity, unspecified: Secondary | ICD-10-CM | POA: Diagnosis not present

## 2021-05-15 DIAGNOSIS — R69 Illness, unspecified: Secondary | ICD-10-CM | POA: Diagnosis not present

## 2021-05-19 DIAGNOSIS — Z79899 Other long term (current) drug therapy: Secondary | ICD-10-CM | POA: Diagnosis not present

## 2021-06-12 DIAGNOSIS — E039 Hypothyroidism, unspecified: Secondary | ICD-10-CM | POA: Diagnosis not present

## 2021-06-12 DIAGNOSIS — G8929 Other chronic pain: Secondary | ICD-10-CM | POA: Diagnosis not present

## 2021-06-12 DIAGNOSIS — Z79899 Other long term (current) drug therapy: Secondary | ICD-10-CM | POA: Diagnosis not present

## 2021-06-12 DIAGNOSIS — R69 Illness, unspecified: Secondary | ICD-10-CM | POA: Diagnosis not present

## 2021-07-10 DIAGNOSIS — Z79899 Other long term (current) drug therapy: Secondary | ICD-10-CM | POA: Diagnosis not present

## 2021-08-07 DIAGNOSIS — Z79899 Other long term (current) drug therapy: Secondary | ICD-10-CM | POA: Diagnosis not present

## 2021-08-26 DIAGNOSIS — Z1329 Encounter for screening for other suspected endocrine disorder: Secondary | ICD-10-CM | POA: Diagnosis not present

## 2021-08-26 DIAGNOSIS — G8929 Other chronic pain: Secondary | ICD-10-CM | POA: Diagnosis not present

## 2021-08-26 DIAGNOSIS — E559 Vitamin D deficiency, unspecified: Secondary | ICD-10-CM | POA: Diagnosis not present

## 2021-08-26 DIAGNOSIS — Z6834 Body mass index (BMI) 34.0-34.9, adult: Secondary | ICD-10-CM | POA: Diagnosis not present

## 2021-08-26 DIAGNOSIS — E785 Hyperlipidemia, unspecified: Secondary | ICD-10-CM | POA: Diagnosis not present

## 2021-08-26 DIAGNOSIS — Z7689 Persons encountering health services in other specified circumstances: Secondary | ICD-10-CM | POA: Diagnosis not present

## 2021-08-26 DIAGNOSIS — E039 Hypothyroidism, unspecified: Secondary | ICD-10-CM | POA: Diagnosis not present

## 2021-09-09 ENCOUNTER — Ambulatory Visit (INDEPENDENT_AMBULATORY_CARE_PROVIDER_SITE_OTHER): Payer: Medicare HMO | Admitting: Physician Assistant

## 2021-09-09 ENCOUNTER — Encounter: Payer: Self-pay | Admitting: Physician Assistant

## 2021-09-09 VITALS — BP 108/66 | HR 67 | Temp 97.7°F | Ht 71.0 in | Wt 247.0 lb

## 2021-09-09 DIAGNOSIS — Z89512 Acquired absence of left leg below knee: Secondary | ICD-10-CM | POA: Diagnosis not present

## 2021-09-09 DIAGNOSIS — N3281 Overactive bladder: Secondary | ICD-10-CM | POA: Diagnosis not present

## 2021-09-09 DIAGNOSIS — M62838 Other muscle spasm: Secondary | ICD-10-CM | POA: Diagnosis not present

## 2021-09-09 DIAGNOSIS — K219 Gastro-esophageal reflux disease without esophagitis: Secondary | ICD-10-CM | POA: Diagnosis not present

## 2021-09-09 DIAGNOSIS — Z7689 Persons encountering health services in other specified circumstances: Secondary | ICD-10-CM

## 2021-09-09 DIAGNOSIS — E039 Hypothyroidism, unspecified: Secondary | ICD-10-CM | POA: Diagnosis not present

## 2021-09-09 DIAGNOSIS — S065XAA Traumatic subdural hemorrhage with loss of consciousness status unknown, initial encounter: Secondary | ICD-10-CM

## 2021-09-09 DIAGNOSIS — E785 Hyperlipidemia, unspecified: Secondary | ICD-10-CM | POA: Diagnosis not present

## 2021-09-09 DIAGNOSIS — G894 Chronic pain syndrome: Secondary | ICD-10-CM

## 2021-09-09 DIAGNOSIS — J4520 Mild intermittent asthma, uncomplicated: Secondary | ICD-10-CM | POA: Diagnosis not present

## 2021-09-09 MED ORDER — GEMTESA 75 MG PO TABS: 1.0000 | ORAL_TABLET | Freq: Every day | ORAL | 0 refills | Status: DC

## 2021-09-09 MED ORDER — DIAZEPAM 5 MG PO TABS: 5.0000 mg | ORAL_TABLET | Freq: Every day | ORAL | 0 refills | Status: DC | PRN

## 2021-09-09 MED ORDER — MONTELUKAST SODIUM 10 MG PO TABS: 10.0000 mg | ORAL_TABLET | Freq: Every day | ORAL | 1 refills | Status: DC

## 2021-09-09 MED ORDER — DULOXETINE HCL 20 MG PO CPEP: 20.0000 mg | ORAL_CAPSULE | Freq: Every day | ORAL | 1 refills | Status: DC

## 2021-09-09 MED ORDER — FLUTICASONE-SALMETEROL 250-50 MCG/ACT IN AEPB: 1.0000 | INHALATION_SPRAY | Freq: Two times a day (BID) | RESPIRATORY_TRACT | 0 refills | Status: DC

## 2021-09-09 MED ORDER — ESOMEPRAZOLE MAGNESIUM 40 MG PO CPDR: 40.0000 mg | DELAYED_RELEASE_CAPSULE | Freq: Every day | ORAL | 0 refills | Status: DC

## 2021-09-09 NOTE — Patient Instructions (Addendum)

## 2021-09-09 NOTE — Progress Notes (Signed)
? ?New Patient Office Visit ? ?Subjective   ? ?Patient ID: Frances Finley, female    DOB: 08-08-1955  Age: 66 y.o. MRN: [id number] ? ?CC:  ?Chief Complaint  ?Patient presents with  ? New Patient (Initial Visit)  ? ? ?HPI ?Frances A Finley presents to establish care. Patient is transferring care from [facility]. Patient has a past medical history of hypertension, hyperlipidemia, hypothyroidism, aortic thromboembolism, DVT and pulmonary embolism. Patient takes oxycodone 5 mg every four hours as needed for pain. States has been on pain medication since 2006. 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 Also takes Valium 5 mg daily as needed for spasms. Also takes Gabapentin 600 mg BID for nerve pain which she states is not very effective. Takes levothyroxine 100 mcg for hypothyroidism. Takes Nexium 20 mg 2 capsules BID for GERD. Takes HCTZ 25 mg daily for high blood pressure. Takes simvastatin 20 mg for hyperlipidemia. Patient is followed by Urology and they manage Gemtesa 75 mg for overactive bladder. States takes singular and uses fluticasone 250/50 mg daily for asthma.  ? ? ?Outpatient Encounter Medications as of 09/09/2021  ?Medication Sig  ? albuterol (PROVENTIL HFA;VENTOLIN HFA) 108 (90 BASE) MCG/ACT inhaler Inhale 2 puffs into the lungs every 6 (six) hours as needed for wheezing.  ? diazepam (VALIUM) 5 MG tablet Take 1 tablet (5 mg total) by mouth daily as needed for muscle spasms.  ? DULoxetine (CYMBALTA) 20 MG capsule Take 1 capsule (20 mg total) by mouth daily.  ? esomeprazole (NEXIUM) 40 MG capsule Take 1 capsule (40 mg total) by mouth daily.  ? fluticasone-salmeterol (ADVAIR) 250-50 MCG/ACT AEPB Inhale 1 puff into the lungs in the morning and at bedtime.  ? gabapentin (NEURONTIN) 600 MG tablet Take 1 tablet (600 mg total) by mouth 2 (two) times daily.  ? HYDROCHLOROTHIAZIDE PO Take 25 mg by mouth daily.  ? levothyroxine (SYNTHROID, LEVOTHROID) 100 MCG tablet Take 1 tablet (100 mcg total) by  mouth daily before breakfast.  ? montelukast (SINGULAIR) 10 MG tablet Take 1 tablet (10 mg total) by mouth at bedtime.  ? oxyCODONE (OXY IR/ROXICODONE) 5 MG immediate release tablet Take 1-2 tablets (5-10 mg total) by mouth every 4 (four) hours as needed.  ? potassium chloride SA (K-DUR,KLOR-CON) 20 MEQ tablet Take 1 tablet (20 mEq total) by mouth 2 (two) times daily.  ? simvastatin (ZOCOR) 20 MG tablet Take 1 tablet (20 mg total) by mouth every evening.  ? Vibegron (GEMTESA) 75 MG TABS Take 1 tablet by mouth daily.  ? [DISCONTINUED] esomeprazole (NEXIUM) 40 MG capsule Take 1 capsule (40 mg total) by mouth 2 (two) times daily.  ? [DISCONTINUED] furosemide (LASIX) 40 MG tablet Take 1 tablet (40 mg total) by mouth 2 (two) times daily. For 6 days.  ? [DISCONTINUED] levETIRAcetam (KEPPRA) 500 MG tablet Take 1 tablet (500 mg total) by mouth 2 (two) times daily.  ? [DISCONTINUED] topiramate (TOPAMAX) 25 MG tablet Take 1 tablet (25 mg total) by mouth at bedtime.  ? ?No facility-administered encounter medications on file as of 09/09/2021.  ? ? ?Past Medical History:  ?Diagnosis Date  ? Aortic thromboembolism (HCC)   ? DVT (deep venous thrombosis) (HCC)   ? GERD (gastroesophageal reflux disease)   ? Hypertension   ? Hypothyroidism   ? Iron deficiency anemia   ? PE (pulmonary embolism)   ? SLE inhibitor syndrome   ? ? ?Past Surgical History:  ?Procedure Laterality Date  ? ABDOMINAL HYSTERECTOMY    ?  BELOW KNEE LEG AMPUTATION  2003  ? CHOLECYSTECTOMY    ? ? ?History reviewed. No pertinent family history. ? ?Social History  ? ?Socioeconomic History  ? Marital status: Married  ?  Spouse name: Not on file  ? Number of children: Not on file  ? Years of education: Not on file  ? Highest education level: Not on file  ?Occupational History  ? Not on file  ?Tobacco Use  ? Smoking status: Former  ? Smokeless tobacco: Not on file  ?Substance and Sexual Activity  ? Alcohol use: Yes  ? Drug use: No  ? Sexual activity: Not on file  ?Other  Topics Concern  ? Not on file  ?Social History Narrative  ? Not on file  ? ?Social Determinants of Health  ? ?Financial Resource Strain: Not on file  ?Food Insecurity: Not on file  ?Transportation Needs: Not on file  ?Physical Activity: Not on file  ?Stress: Not on file  ?Social Connections: Not on file  ?Intimate Partner Violence: Not on file  ? ? ?ROS ?Review of Systems:  ?A fourteen system review of systems was performed and found to be positive as per HPI. ? ?  ? ? ?Objective   ? ?BP 108/66   Pulse 67   Temp 97.7 ?F (36.5 ?C)   Ht 5\' 11"  (1.803 m)   Wt 247 lb (112 kg)   SpO2 97%   BMI 34.45 kg/m?  ? ?Physical Exam ?General:Cooperative, in no acute distress, BKA (left)  ?Neuro:  Alert and oriented,  extra-ocular muscles intact  ?HEENT:  Normocephalic, atraumatic, neck supple  ?Skin:  no gross rash.  ?Cardiac:  RRR, S1 S2 ?Respiratory: CTA B/L  ?Vascular:  Ext warm, no cyanosis apprec.; cap RF less 2 sec. ?Psych:  No HI/SI, judgement and insight good, Euthymic mood. Full Affect. ? ? ?  ? ?Assessment & Plan:  ? ?Problem List Items Addressed This Visit   ? ?  ? Digestive  ? GERD  ? Relevant Medications  ? esomeprazole (NEXIUM) 40 MG capsule  ?  ? Nervous and Auditory  ? SDH (subdural hematoma) (HCC)  ? ?Other Visit Diagnoses   ? ? Chronic pain syndrome    -  Primary  ? Relevant Medications  ? DULoxetine (CYMBALTA) 20 MG capsule  ? Other Relevant Orders  ? Ambulatory referral to Pain Clinic  ? History of below-knee amputation of left lower extremity (HCC)      ? Encounter to establish care      ? Hypothyroidism, unspecified type      ? Hyperlipidemia, unspecified hyperlipidemia type      ? Overactive bladder      ? Relevant Medications  ? Vibegron (GEMTESA) 75 MG TABS  ? Mild intermittent asthma without complication      ? Relevant Medications  ? fluticasone-salmeterol (ADVAIR) 250-50 MCG/ACT AEPB  ? montelukast (SINGULAIR) 10 MG tablet  ? Muscle spasm      ? Relevant Medications  ? diazepam (VALIUM) 5 MG  tablet  ? ?  ? ?Encounter to establish care: ?-Per paper chart review patient brought, left LKA in 2004 was secondary to peripheral vascular disease after failing multiple attempts at revascularization. Patient was evaluated by hematology in 2004 for positive lupus anticoagulant and thrombophilia where she was started on warfarin.  ?-Reviewed hospital notes in Epic from 01/10/2013 related to SDH, patient was discontinued from warfarin due to hemorrhage and has not been on anticoagulant therapy since then.  ? ? ?Chronic  pain syndrome, Muscle spasm: ?-Discussed with patient the office policy for controlled substance- opioids and recommend referral to pain management. Patient is agreeable. Meanwhile, will start duloxetine 20 mg daily to help improve pain control. Discussed with patient office policy regarding controlled substance - benzodiazapine due to side effects and advised can obtain 2 refills per year for valium 5 mg once daily as needed for spasms for 30 tablets. Patient is agreeable. Controlled substance contract obtained. Discussed if muscle spasm worsen then we can consider alternatives such as muscle relaxer (baclofen or methocarbamol).  ? ?Hypertension: ?-BP initially elevated. BP repeated and improved. ?-Continue HCTZ 25 mg daily. ?-Will continue to monitor. ? ?Hypothyroidism: ?-Continue levothyroxine 100 mcg. ?-Will continue to monitor and plan to obtain fasting labs with annual medicare wellness in 2-3 months.  ? ?Hyperlipidemia: ?-Continue simvastatin 20 mg. ?-Will continue to monitor. ? ?GERD: ?-Continue esomeprazaole 40 mg daily.  ?-Recommend reducing the dose to 20 mg in the future if symptoms remain well-controlled.  ? ?Overactive bladder: ?-Followed by Urology (Dr. Earlene Plateravis). ?-Recommend to continue with Gemtesa 75 mg.  ? ?Mild intermittent asthma without complication: ?-Recommend to continue with Singulair 10 mg daily, Advair 250/50 mg daily, and albuterol as needed. ?-Will continue to  monitor. ? ?Return in about 4 weeks (around 10/07/2021) for Mood, pain, HTN.  ? ?Mayer MaskerMaritza Quintessa Simmerman, PA-C ? ? ?

## 2021-09-17 DIAGNOSIS — N3000 Acute cystitis without hematuria: Secondary | ICD-10-CM | POA: Diagnosis not present

## 2021-09-17 DIAGNOSIS — N3281 Overactive bladder: Secondary | ICD-10-CM | POA: Diagnosis not present

## 2021-09-22 ENCOUNTER — Other Ambulatory Visit: Payer: Self-pay | Admitting: Physician Assistant

## 2021-09-25 ENCOUNTER — Telehealth: Payer: Self-pay | Admitting: Physician Assistant

## 2021-09-25 MED ORDER — ALBUTEROL SULFATE HFA 108 (90 BASE) MCG/ACT IN AERS: 2.0000 | INHALATION_SPRAY | Freq: Four times a day (QID) | RESPIRATORY_TRACT | 2 refills | Status: DC | PRN

## 2021-09-25 NOTE — Telephone Encounter (Signed)
Patient requesting refill of albuterol. Please advise.

## 2021-09-25 NOTE — Telephone Encounter (Signed)
Rx sent to pharmacy- per [facility]. AS, CMA

## 2021-10-02 ENCOUNTER — Other Ambulatory Visit: Payer: Self-pay | Admitting: Physician Assistant

## 2021-10-02 DIAGNOSIS — Z79899 Other long term (current) drug therapy: Secondary | ICD-10-CM | POA: Diagnosis not present

## 2021-10-02 DIAGNOSIS — Z1389 Encounter for screening for other disorder: Secondary | ICD-10-CM | POA: Diagnosis not present

## 2021-10-02 DIAGNOSIS — G894 Chronic pain syndrome: Secondary | ICD-10-CM | POA: Diagnosis not present

## 2021-10-07 ENCOUNTER — Ambulatory Visit (INDEPENDENT_AMBULATORY_CARE_PROVIDER_SITE_OTHER): Payer: Medicare HMO | Admitting: Physician Assistant

## 2021-10-07 ENCOUNTER — Encounter: Payer: Self-pay | Admitting: Physician Assistant

## 2021-10-07 VITALS — BP 121/72 | HR 93 | Temp 97.7°F | Ht 70.0 in | Wt 256.0 lb

## 2021-10-07 DIAGNOSIS — R300 Dysuria: Secondary | ICD-10-CM | POA: Diagnosis not present

## 2021-10-07 DIAGNOSIS — I10 Essential (primary) hypertension: Secondary | ICD-10-CM | POA: Insufficient documentation

## 2021-10-07 DIAGNOSIS — G629 Polyneuropathy, unspecified: Secondary | ICD-10-CM | POA: Diagnosis not present

## 2021-10-07 DIAGNOSIS — F411 Generalized anxiety disorder: Secondary | ICD-10-CM

## 2021-10-07 DIAGNOSIS — G894 Chronic pain syndrome: Secondary | ICD-10-CM

## 2021-10-07 DIAGNOSIS — R69 Illness, unspecified: Secondary | ICD-10-CM | POA: Diagnosis not present

## 2021-10-07 DIAGNOSIS — J4520 Mild intermittent asthma, uncomplicated: Secondary | ICD-10-CM

## 2021-10-07 LAB — POCT URINALYSIS DIPSTICK
Bilirubin, UA: NEGATIVE
Glucose, UA: NEGATIVE
Ketones, UA: NEGATIVE
Leukocytes, UA: NEGATIVE
Nitrite, UA: NEGATIVE
Protein, UA: NEGATIVE
Spec Grav, UA: 1.025 (ref 1.010–1.025)
Urobilinogen, UA: 0.2 E.U./dL
pH, UA: 5.5 (ref 5.0–8.0)

## 2021-10-07 MED ORDER — ALBUTEROL SULFATE HFA 108 (90 BASE) MCG/ACT IN AERS: 2.0000 | INHALATION_SPRAY | Freq: Four times a day (QID) | RESPIRATORY_TRACT | 2 refills | Status: DC | PRN

## 2021-10-07 MED ORDER — BACLOFEN 10 MG PO TABS: 10.0000 mg | ORAL_TABLET | Freq: Two times a day (BID) | ORAL | 1 refills | Status: DC | PRN

## 2021-10-07 MED ORDER — FLUTICASONE-SALMETEROL 250-50 MCG/ACT IN AEPB: 1.0000 | INHALATION_SPRAY | Freq: Two times a day (BID) | RESPIRATORY_TRACT | 2 refills | Status: DC

## 2021-10-07 MED ORDER — GABAPENTIN 600 MG PO TABS: 600.0000 mg | ORAL_TABLET | Freq: Three times a day (TID) | ORAL | 0 refills | Status: DC

## 2021-10-07 NOTE — Patient Instructions (Signed)

## 2021-10-07 NOTE — Assessment & Plan Note (Signed)
-  BP today is much better. Recommend to continue with HCTZ 25 mg daily. Will collect CMP for medication monitoring with medicare wellness. Will continue to monitor.

## 2021-10-07 NOTE — Assessment & Plan Note (Signed)
-  Stable. GAD-7 score low. Will continue to monitor.

## 2021-10-07 NOTE — Assessment & Plan Note (Addendum)
-  Discussed with patient taking Gabapentin 600 mg TID to help reduce long-term side effects and is agreeable. Will continue to monitor.

## 2021-10-07 NOTE — Progress Notes (Signed)
Established patient visit   Patient: Frances Finley   DOB: 03/22/1956   66 y.o. Female  MRN: [id number] Visit Date: 10/07/2021  Chief Complaint  Patient presents with   Follow-up   Subjective    HPI  Patient presents for chronic follow-up.  Patient reports having urinary urgency and dysuria. No fever, flank pain, nausea or vomiting. Patient reports tried Cymbalta 20 mg for 1 week and it was ineffective. Wants to try muscle relaxer instead. Reports not feeling anxious. Also states takes Gabapentin 600 mg four times daily not twice. Requesting refill of Advair and albuterol.   HTN: Pt denies chest pain, palpitations, dizziness or lower extremity swelling. Taking medication as directed without side effects.      Medications: Outpatient Medications Prior to Visit  Medication Sig   diazepam (VALIUM) 5 MG tablet Take 1 tablet (5 mg total) by mouth daily as needed for muscle spasms.   esomeprazole (NEXIUM) 40 MG capsule Take 1 capsule (40 mg total) by mouth daily.   HYDROCHLOROTHIAZIDE PO Take 25 mg by mouth daily.   levothyroxine (SYNTHROID, LEVOTHROID) 100 MCG tablet Take 1 tablet (100 mcg total) by mouth daily before breakfast.   montelukast (SINGULAIR) 10 MG tablet Take 1 tablet (10 mg total) by mouth at bedtime.   oxyCODONE (OXY IR/ROXICODONE) 5 MG immediate release tablet Take 1-2 tablets (5-10 mg total) by mouth every 4 (four) hours as needed.   potassium chloride SA (K-DUR,KLOR-CON) 20 MEQ tablet Take 1 tablet (20 mEq total) by mouth 2 (two) times daily.   simvastatin (ZOCOR) 20 MG tablet Take 1 tablet (20 mg total) by mouth every evening.   Vibegron (GEMTESA) 75 MG TABS Take 1 tablet by mouth daily.   [DISCONTINUED] albuterol (VENTOLIN HFA) 108 (90 Base) MCG/ACT inhaler Inhale 2 puffs into the lungs every 6 (six) hours as needed for wheezing.   [DISCONTINUED] DULoxetine (CYMBALTA) 20 MG capsule Take 1 capsule (20 mg total) by mouth daily.   [DISCONTINUED]  fluticasone-salmeterol (ADVAIR) 250-50 MCG/ACT AEPB Inhale 1 puff into the lungs in the morning and at bedtime.   [DISCONTINUED] gabapentin (NEURONTIN) 600 MG tablet Take 1 tablet (600 mg total) by mouth 2 (two) times daily.   No facility-administered medications prior to visit.      10/07/2021    2:28 PM 09/09/2021    3:10 PM  Depression screen PHQ 2/9  Decreased Interest 1 1  Down, Depressed, Hopeless 0 1  PHQ - 2 Score 1 2  Altered sleeping 0 0  Tired, decreased energy 1 0  Change in appetite 0 0  Feeling bad or failure about yourself  0 1  Trouble concentrating 1 0  Moving slowly or fidgety/restless 0 0  Suicidal thoughts 0 0  PHQ-9 Score 3 3  Difficult doing work/chores Somewhat difficult Very difficult      10/07/2021    2:29 PM 09/09/2021    3:10 PM  GAD 7 : Generalized Anxiety Score  Nervous, Anxious, on Edge 1 0  Control/stop worrying 1 1  Worry too much - different things 0 1  Trouble relaxing 0 0  Restless 0 0  Easily annoyed or irritable 0 0  Afraid - awful might happen 0 0  Total GAD 7 Score 2 2  Anxiety Difficulty Not difficult at all Very difficult    Review of Systems Review of Systems:  A fourteen system review of systems was performed and found to be positive as per HPI.   Last CBC Lab Results  Component Value Date   WBC 12.8 (H) 01/11/2013   HGB 18.6 (H) 01/11/2013   HCT 51.0 (H) 01/11/2013   MCV 89.2 01/11/2013   MCH 32.5 01/11/2013   RDW 13.3 01/11/2013   PLT 448 (H) 01/11/2013   Last metabolic panel Lab Results  Component Value Date   GLUCOSE 118 (H) 01/11/2013   NA 127 (L) 01/11/2013   K 3.3 (L) 01/11/2013   CL 85 (L) 01/11/2013   CO2 26 01/11/2013   BUN 20 01/11/2013   CREATININE 0.99 01/11/2013   GFRNONAA 63 (L) 01/11/2013   CALCIUM 10.1 01/11/2013   PROT 8.2 01/11/2013   ALBUMIN 4.2 01/11/2013   BILITOT 0.7 01/11/2013   ALKPHOS 70 01/11/2013   AST 44 (H) 01/11/2013   ALT 81 (H) 01/11/2013   Last lipids No results found for:  CHOL, HDL, LDLCALC, LDLDIRECT, TRIG, CHOLHDL Last hemoglobin A1c No results found for: HGBA1C Last thyroid functions No results found for: TSH, T3TOTAL, T4TOTAL, THYROIDAB Last vitamin D No results found for: 25OHVITD2, 25OHVITD3, VD25OH   Objective    BP 121/72   Pulse 93   Temp 97.7 F (36.5 C)   Ht 5\' 10"  (1.778 m)   Wt 256 lb (116.1 kg)   SpO2 98%   BMI 36.73 kg/m  BP Readings from Last 3 Encounters:  10/07/21 121/72  09/09/21 108/66  01/23/13 117/78   Wt Readings from Last 3 Encounters:  10/07/21 256 lb (116.1 kg)  09/09/21 247 lb (112 kg)  01/18/13 203 lb 4.2 oz (92.2 kg)    Physical Exam  General:  Pleasant and cooperative, appropriate for stated age.  Neuro:  Alert and oriented,  extra-ocular muscles intact  HEENT:  Normocephalic, atraumatic, neck supple  Skin:  no gross rash, warm, pink. Cardiac:  RRR, S1 S2 Respiratory: CTA B/L  Vascular:  Ext warm, no cyanosis apprec.; cap RF less 2 sec. Psych:  No HI/SI, judgement and insight good, Euthymic mood. Full Affect.   Results for orders placed or performed in visit on 10/07/21  POCT urinalysis dipstick  Result Value Ref Range   Color, UA     Clarity, UA     Glucose, UA Negative Negative   Bilirubin, UA neg    Ketones, UA neg    Spec Grav, UA 1.025 1.010 - 1.025   Blood, UA trace    pH, UA 5.5 5.0 - 8.0   Protein, UA Negative Negative   Urobilinogen, UA 0.2 0.2 or 1.0 E.U./dL   Nitrite, UA neg    Leukocytes, UA Negative Negative   Appearance     Odor      Assessment & Plan      Problem List Items Addressed This Visit       Cardiovascular and Mediastinum   Hypertension    -BP today is much better. Recommend to continue with HCTZ 25 mg daily. Will collect CMP for medication monitoring with medicare wellness. Will continue to monitor.         Nervous and Auditory   Neuropathy    -Discussed with patient taking Gabapentin 600 mg TID to help reduce long-term side effects and is agreeable. Will  continue to monitor.       Relevant Medications   gabapentin (NEURONTIN) 600 MG tablet     Other   Anxiety state    -Stable. GAD-7 score low. Will continue to monitor.       Chronic pain syndrome    -Patient was referred to pain management and  appointment pending. Will discontinue Cymbalta 20 mg and start muscle relaxer- baclofen 10 mg BID as needed for spasms.        Relevant Medications   baclofen (LIORESAL) 10 MG tablet   gabapentin (NEURONTIN) 600 MG tablet   Other Visit Diagnoses     Dysuria    -  Primary   Relevant Orders   POCT urinalysis dipstick (Completed)   Urine Culture   Mild intermittent asthma without complication       Relevant Medications   albuterol (VENTOLIN HFA) 108 (90 Base) MCG/ACT inhaler   fluticasone-salmeterol (ADVAIR) 250-50 MCG/ACT AEPB      Dysuria: -UA collected, essentially unremarkable with trace of blood. Negative for nitrite and leukocytes. Will send for urine culture, pending results will start appropriate antibiotic therapy if indicated.    Return for MCW and FBW.        Mayer Masker, PA-C  [facility] [phone] (phone) [phone] (fax)  [facility]

## 2021-10-07 NOTE — Assessment & Plan Note (Signed)
-  Patient was referred to pain management and appointment pending. Will discontinue Cymbalta 20 mg and start muscle relaxer- baclofen 10 mg BID as needed for spasms.

## 2021-10-09 LAB — URINE CULTURE

## 2021-10-30 DIAGNOSIS — Z79891 Long term (current) use of opiate analgesic: Secondary | ICD-10-CM | POA: Diagnosis not present

## 2021-10-30 DIAGNOSIS — G546 Phantom limb syndrome with pain: Secondary | ICD-10-CM | POA: Diagnosis not present

## 2021-10-30 DIAGNOSIS — M79609 Pain in unspecified limb: Secondary | ICD-10-CM | POA: Diagnosis not present

## 2021-10-30 DIAGNOSIS — G894 Chronic pain syndrome: Secondary | ICD-10-CM | POA: Diagnosis not present

## 2021-10-30 DIAGNOSIS — Z79899 Other long term (current) drug therapy: Secondary | ICD-10-CM | POA: Diagnosis not present

## 2021-10-30 DIAGNOSIS — Z1389 Encounter for screening for other disorder: Secondary | ICD-10-CM | POA: Diagnosis not present

## 2021-11-27 DIAGNOSIS — Z1389 Encounter for screening for other disorder: Secondary | ICD-10-CM | POA: Diagnosis not present

## 2021-11-27 DIAGNOSIS — Z79891 Long term (current) use of opiate analgesic: Secondary | ICD-10-CM | POA: Diagnosis not present

## 2021-11-27 DIAGNOSIS — M79609 Pain in unspecified limb: Secondary | ICD-10-CM | POA: Diagnosis not present

## 2021-11-27 DIAGNOSIS — G546 Phantom limb syndrome with pain: Secondary | ICD-10-CM | POA: Diagnosis not present

## 2021-11-27 DIAGNOSIS — G894 Chronic pain syndrome: Secondary | ICD-10-CM | POA: Diagnosis not present

## 2021-11-27 DIAGNOSIS — Z79899 Other long term (current) drug therapy: Secondary | ICD-10-CM | POA: Diagnosis not present

## 2021-12-10 ENCOUNTER — Encounter: Payer: Self-pay | Admitting: Physician Assistant

## 2021-12-10 ENCOUNTER — Ambulatory Visit (INDEPENDENT_AMBULATORY_CARE_PROVIDER_SITE_OTHER): Payer: Medicare HMO | Admitting: Physician Assistant

## 2021-12-10 VITALS — BP 116/70 | HR 93 | Ht 70.0 in | Wt 247.4 lb

## 2021-12-10 DIAGNOSIS — G894 Chronic pain syndrome: Secondary | ICD-10-CM | POA: Diagnosis not present

## 2021-12-10 DIAGNOSIS — K219 Gastro-esophageal reflux disease without esophagitis: Secondary | ICD-10-CM | POA: Diagnosis not present

## 2021-12-10 DIAGNOSIS — Z0000 Encounter for general adult medical examination without abnormal findings: Secondary | ICD-10-CM

## 2021-12-10 DIAGNOSIS — Z1321 Encounter for screening for nutritional disorder: Secondary | ICD-10-CM | POA: Diagnosis not present

## 2021-12-10 DIAGNOSIS — Z13228 Encounter for screening for other metabolic disorders: Secondary | ICD-10-CM | POA: Diagnosis not present

## 2021-12-10 DIAGNOSIS — Z130 Encounter for screening for diseases of the blood and blood-forming organs and certain disorders involving the immune mechanism: Secondary | ICD-10-CM | POA: Diagnosis not present

## 2021-12-10 DIAGNOSIS — E785 Hyperlipidemia, unspecified: Secondary | ICD-10-CM

## 2021-12-10 DIAGNOSIS — Z1329 Encounter for screening for other suspected endocrine disorder: Secondary | ICD-10-CM | POA: Diagnosis not present

## 2021-12-10 MED ORDER — SIMVASTATIN 20 MG PO TABS: 20.0000 mg | ORAL_TABLET | Freq: Every evening | ORAL | 1 refills | Status: DC

## 2021-12-10 MED ORDER — ESOMEPRAZOLE MAGNESIUM 40 MG PO CPDR: 40.0000 mg | DELAYED_RELEASE_CAPSULE | Freq: Every day | ORAL | 1 refills | Status: AC

## 2021-12-10 MED ORDER — BACLOFEN 10 MG PO TABS: 10.0000 mg | ORAL_TABLET | Freq: Two times a day (BID) | ORAL | 0 refills | Status: DC | PRN

## 2021-12-10 NOTE — Progress Notes (Signed)
Subjective:   Frances Finley is a 66 y.o. female who presents for Medicare Annual (Subsequent) preventive examination.  Review of Systems    Referred to PCP  I connected with  Frances Finley on 12/10/21 in person face to face application and verified that I am speaking with the correct person using two identifiers.   I discussed the limitations, risks, security and privacy concerns of performing an evaluation and management service by telephone and the availability of in person appointments. I also discussed with the patient that there may be a patient responsible charge related to this service. The patient expressed understanding and verbally consented to this telephonic visit.  Location of Patient: in office Location of Provider:in office  List any persons and their role that are participating in the visit with the patient.       Objective:    Today's Vitals   12/10/21 1321 12/10/21 1346  BP: (!) 145/90 116/70  Pulse: 93   SpO2: 90%   Weight: 247 lb 6.4 oz (112.2 kg)   Height: 5\' 10"  (1.778 m)    Body mass index is 35.5 kg/m.     01/11/2013    7:45 AM 01/05/2013    4:00 AM  Advanced Directives  Does Patient Have a Medical Advance Directive? Patient does not have advance directive;Patient would not like information Patient does not have advance directive  Pre-existing out of facility DNR order (yellow form or pink MOST form) No No    Current Medications (verified) Outpatient Encounter Medications as of 12/10/2021  Medication Sig   albuterol (VENTOLIN HFA) 108 (90 Base) MCG/ACT inhaler Inhale 2 puffs into the lungs every 6 (six) hours as needed for wheezing.   diazepam (VALIUM) 5 MG tablet Take 1 tablet (5 mg total) by mouth daily as needed for muscle spasms.   fluticasone-salmeterol (ADVAIR) 250-50 MCG/ACT AEPB Inhale 1 puff into the lungs in the morning and at bedtime.   gabapentin (NEURONTIN) 600 MG tablet Take 1 tablet (600 mg total) by mouth 3 (three) times  daily.   HYDROCHLOROTHIAZIDE PO Take 25 mg by mouth daily.   levothyroxine (SYNTHROID, LEVOTHROID) 100 MCG tablet Take 1 tablet (100 mcg total) by mouth daily before breakfast.   montelukast (SINGULAIR) 10 MG tablet Take 1 tablet (10 mg total) by mouth at bedtime.   oxyCODONE (OXY IR/ROXICODONE) 5 MG immediate release tablet Take 1-2 tablets (5-10 mg total) by mouth every 4 (four) hours as needed.   potassium chloride SA (K-DUR,KLOR-CON) 20 MEQ tablet Take 1 tablet (20 mEq total) by mouth 2 (two) times daily.   Vibegron (GEMTESA) 75 MG TABS Take 1 tablet by mouth daily.   [DISCONTINUED] baclofen (LIORESAL) 10 MG tablet Take 1 tablet (10 mg total) by mouth 2 (two) times daily as needed for muscle spasms.   [DISCONTINUED] esomeprazole (NEXIUM) 40 MG capsule Take 1 capsule (40 mg total) by mouth daily.   [DISCONTINUED] simvastatin (ZOCOR) 20 MG tablet Take 1 tablet (20 mg total) by mouth every evening.   baclofen (LIORESAL) 10 MG tablet Take 1 tablet (10 mg total) by mouth 2 (two) times daily as needed for muscle spasms.   esomeprazole (NEXIUM) 40 MG capsule Take 1 capsule (40 mg total) by mouth daily.   simvastatin (ZOCOR) 20 MG tablet Take 1 tablet (20 mg total) by mouth every evening.   No facility-administered encounter medications on file as of 12/10/2021.    Allergies (verified) Patient has no known allergies.   History: Past Medical  History:  Diagnosis Date   Aortic thromboembolism (HCC)    DVT (deep venous thrombosis) (HCC)    GERD (gastroesophageal reflux disease)    Hypertension    Hypothyroidism    Iron deficiency anemia    PE (pulmonary embolism)    SLE inhibitor syndrome    Past Surgical History:  Procedure Laterality Date   ABDOMINAL HYSTERECTOMY     BELOW KNEE LEG AMPUTATION  2003   CHOLECYSTECTOMY     History reviewed. No pertinent family history. Social History   Socioeconomic History   Marital status: Married    Spouse name: Not on file   Number of children:  Not on file   Years of education: Not on file   Highest education level: Not on file  Occupational History   Not on file  Tobacco Use   Smoking status: Former   Smokeless tobacco: Not on file  Substance and Sexual Activity   Alcohol use: Yes   Drug use: No   Sexual activity: Not on file  Other Topics Concern   Not on file  Social History Narrative   Not on file   Social Determinants of Health   Financial Resource Strain: Low Risk  (12/10/2021)   Overall Financial Resource Strain (CARDIA)    Difficulty of Paying Living Expenses: Not hard at all  Food Insecurity: No Food Insecurity (12/10/2021)   Hunger Vital Sign    Worried About Running Out of Food in the Last Year: Never true    Ran Out of Food in the Last Year: Never true  Transportation Needs: No Transportation Needs (12/10/2021)   PRAPARE - Administrator, Civil Service (Medical): No    Lack of Transportation (Non-Medical): No  Physical Activity: Inactive (12/10/2021)   Exercise Vital Sign    Days of Exercise per Week: 4 days    Minutes of Exercise per Session: 0 min  Stress: No Stress Concern Present (12/10/2021)   [employer] of Occupational Health - Occupational Stress Questionnaire    Feeling of Stress : Not at all  Social Connections: Moderately Integrated (12/10/2021)   Social Connection and Isolation Panel [NHANES]    Frequency of Communication with Friends and Family: Twice a week    Frequency of Social Gatherings with Friends and Family: Twice a week    Attends Religious Services: Never    Database administrator or Organizations: Yes    Attends Banker Meetings: Never    Marital Status: Married    Tobacco Counseling Counseling given: Not Answered   Clinical Intake:                 Diabetic?no         Activities of Daily Living    10/07/2021    2:29 PM 09/09/2021    3:10 PM  In your present state of health, do you have any difficulty performing the following  activities:  Hearing? 0 0  Vision? 0 0  Difficulty concentrating or making decisions? 0 0  Walking or climbing stairs? 1 1  Dressing or bathing? 1 1  Doing errands, shopping? 1 1    Patient Care Team: Mayer Masker, PA-C as PCP - General (Physician Assistant)  Indicate any recent Medical Services you may have received from other than [facility] providers in the past year (date may be approximate).     Assessment:   This is a routine wellness examination for [place].  Hearing/Vision screen No results found.  Dietary issues and exercise  activities discussed:     Goals Addressed   None   Depression Screen    10/07/2021    2:28 PM 09/09/2021    3:10 PM  PHQ 2/9 Scores  PHQ - 2 Score 1 2  PHQ- 9 Score 3 3    Fall Risk    10/07/2021    2:28 PM 09/09/2021    3:10 PM  Fall Risk   Falls in the past year? 1 1  Number falls in past yr: 1 1  Injury with Fall? 1 1  Risk for fall due to : History of fall(s);Impaired balance/gait;Impaired mobility Impaired balance/gait;Impaired mobility  Follow up Falls evaluation completed Falls evaluation completed    FALL RISK PREVENTION PERTAINING TO THE HOME:  Any stairs in or around the home? Yes  If so, are there any without handrails? No  Home free of loose throw rugs in walkways, pet beds, electrical cords, etc? Yes  Adequate lighting in your home to reduce risk of falls? Yes   ASSISTIVE DEVICES UTILIZED TO PREVENT FALLS:  Life alert? No  Use of a cane, walker or w/c? No  Grab bars in the bathroom? Yes  Shower chair or bench in shower? Yes  Elevated toilet seat or a handicapped toilet? Yes   TIMED UP AND GO:  Was the test performed? Yes .  Length of time to ambulate 10 feet: 15 sec.   Gait steady and fast without use of assistive device  Cognitive Function:        12/10/2021    1:24 PM  6CIT Screen  What Year? 0 points  What month? 0 points  What time? 0 points  Count back from 20 0 points  Months in reverse 0 points   Repeat phrase 0 points  Total Score 0 points    Immunizations  There is no immunization history on file for this patient.  TDAP status: Due, Education has been provided regarding the importance of this vaccine. Advised may receive this vaccine at local pharmacy or Health Dept. Aware to provide a copy of the vaccination record if obtained from local pharmacy or Health Dept. Verbalized acceptance and understanding.  Flu Vaccine status: Up to date  Pneumococcal vaccine status: Due, Education has been provided regarding the importance of this vaccine. Advised may receive this vaccine at local pharmacy or Health Dept. Aware to provide a copy of the vaccination record if obtained from local pharmacy or Health Dept. Verbalized acceptance and understanding.  Covid-19 vaccine status: Completed vaccines  Qualifies for Shingles Vaccine? Yes   Zostavax completed No   Shingrix Completed?: No.    Education has been provided regarding the importance of this vaccine. Patient has been advised to call insurance company to determine out of pocket expense if they have not yet received this vaccine. Advised may also receive vaccine at local pharmacy or Health Dept. Verbalized acceptance and understanding.  Screening Tests Health Maintenance  Topic Date Due   COVID-19 Vaccine (1) Never done   HIV Screening  Never done   Hepatitis C Screening  Never done   TETANUS/TDAP  Never done   PAP SMEAR-Modifier  Never done   COLONOSCOPY (Pts 45-72yrs Insurance coverage will need to be confirmed)  Never done   MAMMOGRAM  Never done   Zoster Vaccines- Shingrix (1 of 2) Never done   INFLUENZA VACCINE  12/02/2021   HPV VACCINES  Aged Out    Health Maintenance  Health Maintenance Due  Topic Date Due   COVID-19  Vaccine (1) Never done   HIV Screening  Never done   Hepatitis C Screening  Never done   TETANUS/TDAP  Never done   PAP SMEAR-Modifier  Never done   COLONOSCOPY (Pts 45-25yrs Insurance coverage will  need to be confirmed)  Never done   MAMMOGRAM  Never done   Zoster Vaccines- Shingrix (1 of 2) Never done   INFLUENZA VACCINE  12/02/2021    Colorectal cancer screening: Referral to GI placed patient declined. Pt aware the office will call re: appt.  Mammogram status: Ordered patient declined. Pt provided with contact info and advised to call to schedule appt.   Bone Density status: Ordered patient declined. Pt provided with contact info and advised to call to schedule appt.  Lung Cancer Screening: (Low Dose CT Chest recommended if Age 73-80 years, 30 pack-year currently smoking OR have quit w/in 15years.) does not qualify.   Lung Cancer Screening Referral: no  Additional Screening:  Hepatitis C Screening: does qualify; Completed 2018  Vision Screening: Recommended annual ophthalmology exams for early detection of glaucoma and other disorders of the eye. Is the patient up to date with their annual eye exam?  No  Who is the provider or what is the name of the office in which the patient attends annual eye exams? N/a If pt is not established with a provider, would they like to be referred to a provider to establish care? No .   Dental Screening: Recommended annual dental exams for proper oral hygiene  Community Resource Referral / Chronic Care Management: CRR required this visit?  No   CCM required this visit?  No      Plan:     I have personally reviewed and noted the following in the patient's chart:   Medical and social history Use of alcohol, tobacco or illicit drugs  Current medications and supplements including opioid prescriptions.  Functional ability and status Nutritional status Physical activity Advanced directives List of other physicians Hospitalizations, surgeries, and ER visits in previous 12 months Vitals Screenings to include cognitive, depression, and falls Referrals and appointments  In addition, I have reviewed and discussed with patient certain  preventive protocols, quality metrics, and best practice recommendations. A written personalized care plan for preventive services as well as general preventive health recommendations were provided to patient.       12/10/2021   Nurse Notes:

## 2021-12-11 LAB — COMPREHENSIVE METABOLIC PANEL
ALT: 17 IU/L (ref 0–32)
AST: 19 IU/L (ref 0–40)
Albumin/Globulin Ratio: 1.7 (ref 1.2–2.2)
Albumin: 4.5 g/dL (ref 3.9–4.9)
Alkaline Phosphatase: 55 IU/L (ref 44–121)
BUN/Creatinine Ratio: 14 (ref 12–28)
BUN: 10 mg/dL (ref 8–27)
Bilirubin Total: 0.4 mg/dL (ref 0.0–1.2)
CO2: 31 mmol/L — ABNORMAL HIGH (ref 20–29)
Calcium: 9.3 mg/dL (ref 8.7–10.3)
Chloride: 92 mmol/L — ABNORMAL LOW (ref 96–106)
Creatinine, Ser: 0.74 mg/dL (ref 0.57–1.00)
Globulin, Total: 2.6 g/dL (ref 1.5–4.5)
Glucose: 105 mg/dL — ABNORMAL HIGH (ref 70–99)
Potassium: 4.5 mmol/L (ref 3.5–5.2)
Sodium: 139 mmol/L (ref 134–144)
Total Protein: 7.1 g/dL (ref 6.0–8.5)
eGFR: 90 mL/min/{1.73_m2} (ref >59)

## 2021-12-11 LAB — CBC WITH DIFFERENTIAL/PLATELET
Basophils Absolute: 0.1 10*3/uL (ref 0.0–0.2)
Basos: 1 %
EOS (ABSOLUTE): 0.2 10*3/uL (ref 0.0–0.4)
Eos: 3 %
Hematocrit: 43.7 % (ref 34.0–46.6)
Hemoglobin: 14.6 g/dL (ref 11.1–15.9)
Immature Grans (Abs): 0 10*3/uL (ref 0.0–0.1)
Immature Granulocytes: 0 %
Lymphocytes Absolute: 2.8 10*3/uL (ref 0.7–3.1)
Lymphs: 38 %
MCH: 30.1 pg (ref 26.6–33.0)
MCHC: 33.4 g/dL (ref 31.5–35.7)
MCV: 90 fL (ref 79–97)
Monocytes Absolute: 0.5 10*3/uL (ref 0.1–0.9)
Monocytes: 7 %
Neutrophils Absolute: 3.7 10*3/uL (ref 1.4–7.0)
Neutrophils: 51 %
Platelets: 223 10*3/uL (ref 150–450)
RBC: 4.85 x10E6/uL (ref 3.77–5.28)
RDW: 11.9 % (ref 11.7–15.4)
WBC: 7.3 10*3/uL (ref 3.4–10.8)

## 2021-12-11 LAB — LIPID PANEL
Chol/HDL Ratio: 1.9 ratio (ref 0.0–4.4)
Cholesterol, Total: 182 mg/dL (ref 100–199)
HDL: 98 mg/dL (ref >39)
LDL Chol Calc (NIH): 70 mg/dL (ref 0–99)
Triglycerides: 80 mg/dL (ref 0–149)
VLDL Cholesterol Cal: 14 mg/dL (ref 5–40)

## 2021-12-11 LAB — HEMOGLOBIN A1C
Est. average glucose Bld gHb Est-mCnc: 120 mg/dL
Hgb A1c MFr Bld: 5.8 % — ABNORMAL HIGH (ref 4.8–5.6)

## 2021-12-11 LAB — VITAMIN D 25 HYDROXY (VIT D DEFICIENCY, FRACTURES): Vit D, 25-Hydroxy: 61.9 ng/mL (ref 30.0–100.0)

## 2021-12-11 LAB — TSH: TSH: 2.33 u[IU]/mL (ref 0.450–4.500)

## 2021-12-15 ENCOUNTER — Encounter: Payer: Self-pay | Admitting: Physician Assistant

## 2021-12-23 ENCOUNTER — Other Ambulatory Visit: Payer: Self-pay | Admitting: Physician Assistant

## 2021-12-23 DIAGNOSIS — J4520 Mild intermittent asthma, uncomplicated: Secondary | ICD-10-CM

## 2021-12-25 DIAGNOSIS — G546 Phantom limb syndrome with pain: Secondary | ICD-10-CM | POA: Diagnosis not present

## 2021-12-25 DIAGNOSIS — G894 Chronic pain syndrome: Secondary | ICD-10-CM | POA: Diagnosis not present

## 2021-12-25 DIAGNOSIS — Z79899 Other long term (current) drug therapy: Secondary | ICD-10-CM | POA: Diagnosis not present

## 2021-12-25 DIAGNOSIS — M79609 Pain in unspecified limb: Secondary | ICD-10-CM | POA: Diagnosis not present

## 2021-12-25 DIAGNOSIS — Z1389 Encounter for screening for other disorder: Secondary | ICD-10-CM | POA: Diagnosis not present

## 2021-12-25 DIAGNOSIS — Z79891 Long term (current) use of opiate analgesic: Secondary | ICD-10-CM | POA: Diagnosis not present

## 2022-01-22 DIAGNOSIS — M79609 Pain in unspecified limb: Secondary | ICD-10-CM | POA: Diagnosis not present

## 2022-01-22 DIAGNOSIS — Z79899 Other long term (current) drug therapy: Secondary | ICD-10-CM | POA: Diagnosis not present

## 2022-01-22 DIAGNOSIS — G894 Chronic pain syndrome: Secondary | ICD-10-CM | POA: Diagnosis not present

## 2022-01-22 DIAGNOSIS — G546 Phantom limb syndrome with pain: Secondary | ICD-10-CM | POA: Diagnosis not present

## 2022-01-22 DIAGNOSIS — Z79891 Long term (current) use of opiate analgesic: Secondary | ICD-10-CM | POA: Diagnosis not present

## 2022-02-17 ENCOUNTER — Other Ambulatory Visit: Payer: Self-pay | Admitting: Physician Assistant

## 2022-02-17 DIAGNOSIS — J4520 Mild intermittent asthma, uncomplicated: Secondary | ICD-10-CM

## 2022-02-26 DIAGNOSIS — M79609 Pain in unspecified limb: Secondary | ICD-10-CM | POA: Diagnosis not present

## 2022-02-26 DIAGNOSIS — Z79891 Long term (current) use of opiate analgesic: Secondary | ICD-10-CM | POA: Diagnosis not present

## 2022-02-26 DIAGNOSIS — G894 Chronic pain syndrome: Secondary | ICD-10-CM | POA: Diagnosis not present

## 2022-02-26 DIAGNOSIS — G546 Phantom limb syndrome with pain: Secondary | ICD-10-CM | POA: Diagnosis not present

## 2022-02-26 DIAGNOSIS — Z79899 Other long term (current) drug therapy: Secondary | ICD-10-CM | POA: Diagnosis not present

## 2022-03-18 ENCOUNTER — Other Ambulatory Visit: Payer: Self-pay | Admitting: Physician Assistant

## 2022-03-30 ENCOUNTER — Other Ambulatory Visit: Payer: Self-pay

## 2022-03-30 DIAGNOSIS — J4520 Mild intermittent asthma, uncomplicated: Secondary | ICD-10-CM

## 2022-03-30 MED ORDER — MONTELUKAST SODIUM 10 MG PO TABS: 10.0000 mg | ORAL_TABLET | Freq: Every day | ORAL | 1 refills | Status: DC

## 2022-04-02 DIAGNOSIS — Z79899 Other long term (current) drug therapy: Secondary | ICD-10-CM | POA: Diagnosis not present

## 2022-04-02 DIAGNOSIS — Z79891 Long term (current) use of opiate analgesic: Secondary | ICD-10-CM | POA: Diagnosis not present

## 2022-04-02 DIAGNOSIS — M79609 Pain in unspecified limb: Secondary | ICD-10-CM | POA: Diagnosis not present

## 2022-04-02 DIAGNOSIS — G894 Chronic pain syndrome: Secondary | ICD-10-CM | POA: Diagnosis not present

## 2022-04-02 DIAGNOSIS — G546 Phantom limb syndrome with pain: Secondary | ICD-10-CM | POA: Diagnosis not present

## 2022-04-06 ENCOUNTER — Telehealth: Payer: Self-pay | Admitting: *Deleted

## 2022-04-06 DIAGNOSIS — J4520 Mild intermittent asthma, uncomplicated: Secondary | ICD-10-CM

## 2022-04-06 MED ORDER — MONTELUKAST SODIUM 10 MG PO TABS: 10.0000 mg | ORAL_TABLET | Freq: Every day | ORAL | 0 refills | Status: DC

## 2022-04-06 NOTE — Telephone Encounter (Signed)
Refill sent to [facility].

## 2022-04-06 NOTE — Telephone Encounter (Signed)
Pt called in reference to her Montelukast Rx, she said that the pharmacy had reached out to [place] to fill this and that there was problem.  Looks like the Rx was placed but not transmitted to pharmacy. Please resend to pharmacy. Christine Schiefelbein Zimmerman Rumple, CMA

## 2022-04-13 ENCOUNTER — Ambulatory Visit: Payer: Medicare HMO | Admitting: Physician Assistant

## 2022-04-14 ENCOUNTER — Ambulatory Visit (INDEPENDENT_AMBULATORY_CARE_PROVIDER_SITE_OTHER): Payer: Medicare HMO | Admitting: Nurse Practitioner

## 2022-04-14 ENCOUNTER — Encounter: Payer: Self-pay | Admitting: Nurse Practitioner

## 2022-04-14 VITALS — BP 125/77 | HR 82 | Ht 70.0 in | Wt 230.8 lb

## 2022-04-14 DIAGNOSIS — Z23 Encounter for immunization: Secondary | ICD-10-CM

## 2022-04-14 DIAGNOSIS — J4520 Mild intermittent asthma, uncomplicated: Secondary | ICD-10-CM | POA: Diagnosis not present

## 2022-04-14 DIAGNOSIS — I10 Essential (primary) hypertension: Secondary | ICD-10-CM | POA: Diagnosis not present

## 2022-04-14 DIAGNOSIS — E039 Hypothyroidism, unspecified: Secondary | ICD-10-CM | POA: Diagnosis not present

## 2022-04-14 MED ORDER — MONTELUKAST SODIUM 10 MG PO TABS: 10.0000 mg | ORAL_TABLET | Freq: Every day | ORAL | 1 refills | Status: DC

## 2022-04-14 MED ORDER — ALBUTEROL SULFATE HFA 108 (90 BASE) MCG/ACT IN AERS: INHALATION_SPRAY | RESPIRATORY_TRACT | 1 refills | Status: DC

## 2022-04-14 MED ORDER — FLUTICASONE-SALMETEROL 250-50 MCG/ACT IN AEPB: 1.0000 | INHALATION_SPRAY | Freq: Two times a day (BID) | RESPIRATORY_TRACT | 1 refills | Status: DC

## 2022-04-14 NOTE — Progress Notes (Signed)
` Established patient visit   Patient: Frances Finley   DOB: [birth year]   66 y.o. Female  MRN: [id number] Visit Date: 04/14/2022   Chief Complaint  Patient presents with   Follow-up   Subjective    HPI  Follow up  -asthma  -blood pressure slightly elevated at start of vist. Returned to normal by endo of visit.  -would like to get TDaP today -Sje denies chest pain, chest pressure, or shortness of breath. She denies headaches or visual disturbances. She denies abdominal pain, nausea, vomiting, or changes in bowel or bladder habits.     Medications: Outpatient Medications Prior to Visit  Medication Sig   baclofen (LIORESAL) 10 MG tablet Take 1 tablet (10 mg total) by mouth 2 (two) times daily as needed for muscle spasms.   diazepam (VALIUM) 5 MG tablet Take 1 tablet (5 mg total) by mouth daily as needed for muscle spasms.   esomeprazole (NEXIUM) 40 MG capsule Take 1 capsule (40 mg total) by mouth daily.   gabapentin (NEURONTIN) 600 MG tablet Take 1 tablet (600 mg total) by mouth 3 (three) times daily.   hydrochlorothiazide (HYDRODIURIL) 25 MG tablet TAKE 1 TABLET BY MOUTH EACH MORNING   HYDROCHLOROTHIAZIDE PO Take 25 mg by mouth daily.   levothyroxine (SYNTHROID) 137 MCG tablet Take 137 mcg by mouth every morning.   oxyCODONE (OXY IR/ROXICODONE) 5 MG immediate release tablet Take 1-2 tablets (5-10 mg total) by mouth every 4 (four) hours as needed.   potassium chloride SA (K-DUR,KLOR-CON) 20 MEQ tablet Take 1 tablet (20 mEq total) by mouth 2 (two) times daily.   simvastatin (ZOCOR) 20 MG tablet Take 1 tablet (20 mg total) by mouth every evening.   Vibegron (GEMTESA) 75 MG TABS Take 1 tablet by mouth daily.   [DISCONTINUED] ADVAIR DISKUS 250-50 MCG/ACT AEPB INHALE 1 PUFF BY MOUTH TWICE A DAY IN THE MORNING AND AT BEDTIME   [DISCONTINUED] albuterol (VENTOLIN HFA) 108 (90 Base) MCG/ACT inhaler INHALE 2 PUFFS BY MOUTH EVERY 6 HOURS AS NEEDED FOR WHEEZING.   [DISCONTINUED] montelukast  (SINGULAIR) 10 MG tablet Take 1 tablet (10 mg total) by mouth at bedtime.   levothyroxine (SYNTHROID, LEVOTHROID) 100 MCG tablet Take 1 tablet (100 mcg total) by mouth daily before breakfast. (Patient not taking: Reported on 04/14/2022)   No facility-administered medications prior to visit.    Review of Systems  Constitutional:  Negative for activity change, appetite change, chills, fatigue and fever.  HENT:  Negative for congestion, postnasal drip, rhinorrhea, sinus pressure, sinus pain, sneezing and sore throat.   Eyes: Negative.   Respiratory:  Negative for cough, chest tightness, shortness of breath and wheezing.        Well managed asthma .  Cardiovascular:  Negative for chest pain and palpitations.  Gastrointestinal:  Negative for abdominal pain, constipation, diarrhea, nausea and vomiting.  Endocrine: Negative for cold intolerance, heat intolerance, polydipsia and polyuria.  Genitourinary:  Negative for dyspareunia, dysuria, flank pain, frequency and urgency.  Musculoskeletal:  Negative for arthralgias, back pain and myalgias.  Skin:  Negative for rash.  Allergic/Immunologic: Positive for environmental allergies.  Neurological:  Negative for dizziness, weakness and headaches.  Hematological:  Negative for adenopathy.  Psychiatric/Behavioral:  The patient is not nervous/anxious.     Last CBC Lab Results  Component Value Date   WBC 7.3 12/10/2021   HGB 14.6 12/10/2021   HCT 43.7 12/10/2021   MCV 90 12/10/2021   MCH 30.1 12/10/2021   RDW 11.9 12/10/2021  PLT 223 16/02/9603   Last metabolic panel Lab Results  Component Value Date   GLUCOSE 105 (H) 12/10/2021   NA 139 12/10/2021   K 4.5 12/10/2021   CL 92 (L) 12/10/2021   CO2 31 (H) 12/10/2021   BUN 10 12/10/2021   CREATININE 0.74 12/10/2021   EGFR 90 12/10/2021   CALCIUM 9.3 12/10/2021   PROT 7.1 12/10/2021   ALBUMIN 4.5 12/10/2021   LABGLOB 2.6 12/10/2021   AGRATIO 1.7 12/10/2021   BILITOT 0.4 12/10/2021    ALKPHOS 55 12/10/2021   AST 19 12/10/2021   ALT 17 12/10/2021   Last lipids Lab Results  Component Value Date   CHOL 182 12/10/2021   HDL 98 12/10/2021   LDLCALC 70 12/10/2021   TRIG 80 12/10/2021   CHOLHDL 1.9 12/10/2021   Last hemoglobin A1c Lab Results  Component Value Date   HGBA1C 5.8 (H) 12/10/2021   Last thyroid functions Lab Results  Component Value Date   TSH 2.330 12/10/2021   Last vitamin D Lab Results  Component Value Date   VD25OH 61.9 12/10/2021       Objective     Today's Vitals   04/14/22 1516 04/14/22 1600  BP: (Abnormal) 147/85 125/77  Pulse: 82   SpO2: 92%   Weight: 230 lb 12.8 oz (104.7 kg)   Height: _0  (1.778 m)    Body mass index is 33.12 kg/m.  BP Readings from Last 3 Encounters:  04/14/22 125/77  12/10/21 116/70  10/07/21 121/72    Wt Readings from Last 3 Encounters:  04/14/22 230 lb 12.8 oz (104.7 kg)  12/10/21 247 lb 6.4 oz (112.2 kg)  10/07/21 256 lb (116.1 kg)    Physical Exam Vitals and nursing note reviewed.  Constitutional:      Appearance: Normal appearance. She is well-developed.  HENT:     Head: Normocephalic and atraumatic.     Nose: Nose normal.     Mouth/Throat:     Mouth: Mucous membranes are moist.     Pharynx: Oropharynx is clear.  Eyes:     Extraocular Movements: Extraocular movements intact.     Conjunctiva/sclera: Conjunctivae normal.     Pupils: Pupils are equal, round, and reactive to light.  Cardiovascular:     Rate and Rhythm: Normal rate and regular rhythm.     Pulses: Normal pulses.     Heart sounds: Normal heart sounds.  Pulmonary:     Effort: Pulmonary effort is normal.     Breath sounds: Normal breath sounds.  Abdominal:     Palpations: Abdomen is soft.  Musculoskeletal:        General: Normal range of motion.     Cervical back: Normal range of motion and neck supple.  Lymphadenopathy:     Cervical: No cervical adenopathy.  Skin:    General: Skin is warm and dry.     Capillary  Refill: Capillary refill takes less than 2 seconds.  Neurological:     General: No focal deficit present.     Mental Status: She is alert and oriented to person, place, and time.  Psychiatric:        Mood and Affect: Mood normal.        Behavior: Behavior normal.        Thought Content: Thought content normal.        Judgment: Judgment normal.       Assessment & Plan    1. Mild intermittent asthma without complication Asthma stable. Continue inhalers  and respiratory medications as prescribed. Refills provided today  - montelukast (SINGULAIR) 10 MG tablet; Take 1 tablet (10 mg total) by mouth at bedtime.  Dispense: 90 tablet; Refill: 1 - fluticasone-salmeterol (ADVAIR DISKUS) 250-50 MCG/ACT AEPB; Inhale 1 puff into the lungs in the morning and at bedtime.  Dispense: 180 each; Refill: 1 - albuterol (VENTOLIN HFA) 108 (90 Base) MCG/ACT inhaler; INHALE 2 PUFFS BY MOUTH EVERY 6 HOURS AS NEEDED FOR WHEEZING.  Dispense: 54 g; Refill: 1  2. Primary hypertension Stable. Continue bp medication as prescribed   3. Acquired hypothyroidism Thyroid panel stable. Continue levothyroxine as prescribed   4. Need for Tdap vaccination Tdap vaccine administered during today's visit.   - Tdap vaccine greater than or equal to 7yo IM   Problem List Items Addressed This Visit       Cardiovascular and Mediastinum   Hypertension     Respiratory   Mild intermittent asthma without complication - Primary   Relevant Medications   montelukast (SINGULAIR) 10 MG tablet   fluticasone-salmeterol (ADVAIR DISKUS) 250-50 MCG/ACT AEPB   albuterol (VENTOLIN HFA) 108 (90 Base) MCG/ACT inhaler     Endocrine   Acquired hypothyroidism   Relevant Medications   levothyroxine (SYNTHROID) 137 MCG tablet   Other Visit Diagnoses     Need for Tdap vaccination       Relevant Orders   Tdap vaccine greater than or equal to 7yo IM (Completed)        Return in about 4 months (around 08/14/2022) for asthma.          Ronnell Freshwater, NP  [facility] [phone] (phone) [phone] (fax)  [place]

## 2022-04-23 DIAGNOSIS — G894 Chronic pain syndrome: Secondary | ICD-10-CM | POA: Diagnosis not present

## 2022-04-23 DIAGNOSIS — Z79899 Other long term (current) drug therapy: Secondary | ICD-10-CM | POA: Diagnosis not present

## 2022-04-23 DIAGNOSIS — M79609 Pain in unspecified limb: Secondary | ICD-10-CM | POA: Diagnosis not present

## 2022-04-23 DIAGNOSIS — G546 Phantom limb syndrome with pain: Secondary | ICD-10-CM | POA: Diagnosis not present

## 2022-04-23 DIAGNOSIS — Z79891 Long term (current) use of opiate analgesic: Secondary | ICD-10-CM | POA: Diagnosis not present

## 2022-05-04 DIAGNOSIS — J4520 Mild intermittent asthma, uncomplicated: Secondary | ICD-10-CM | POA: Insufficient documentation

## 2022-05-04 DIAGNOSIS — E039 Hypothyroidism, unspecified: Secondary | ICD-10-CM | POA: Insufficient documentation

## 2022-05-28 DIAGNOSIS — G894 Chronic pain syndrome: Secondary | ICD-10-CM | POA: Diagnosis not present

## 2022-05-28 DIAGNOSIS — Z79899 Other long term (current) drug therapy: Secondary | ICD-10-CM | POA: Diagnosis not present

## 2022-05-28 DIAGNOSIS — M79609 Pain in unspecified limb: Secondary | ICD-10-CM | POA: Diagnosis not present

## 2022-05-28 DIAGNOSIS — G546 Phantom limb syndrome with pain: Secondary | ICD-10-CM | POA: Diagnosis not present

## 2022-05-28 DIAGNOSIS — Z79891 Long term (current) use of opiate analgesic: Secondary | ICD-10-CM | POA: Diagnosis not present

## 2022-06-15 ENCOUNTER — Other Ambulatory Visit: Payer: Self-pay | Admitting: Nurse Practitioner

## 2022-06-15 DIAGNOSIS — I10 Essential (primary) hypertension: Secondary | ICD-10-CM

## 2022-06-15 DIAGNOSIS — E785 Hyperlipidemia, unspecified: Secondary | ICD-10-CM

## 2022-06-15 NOTE — Telephone Encounter (Signed)
L.O.V: 04/14/22  N.O.V: 08/17/22  L.R.F: 03/18/22 HCTZ 90 tab 0 refill  12/10/21 Simvastatin 90 tab 1 refill

## 2022-06-23 ENCOUNTER — Telehealth: Payer: Self-pay

## 2022-06-23 DIAGNOSIS — G629 Polyneuropathy, unspecified: Secondary | ICD-10-CM

## 2022-06-23 MED ORDER — GABAPENTIN 600 MG PO TABS: 600.0000 mg | ORAL_TABLET | Freq: Three times a day (TID) | ORAL | 0 refills | Status: DC

## 2022-06-23 NOTE — Telephone Encounter (Signed)
Pt is requesting a refill   gabapentin (NEURONTIN) 600 MG tablet    Pharmacy: [place], [place] 04/14/22 ROV 08/17/22

## 2022-06-23 NOTE — Telephone Encounter (Signed)
Refill sent.

## 2022-06-24 ENCOUNTER — Other Ambulatory Visit: Payer: Self-pay | Admitting: Nurse Practitioner

## 2022-06-24 DIAGNOSIS — G629 Polyneuropathy, unspecified: Secondary | ICD-10-CM

## 2022-06-25 DIAGNOSIS — G894 Chronic pain syndrome: Secondary | ICD-10-CM | POA: Diagnosis not present

## 2022-06-25 DIAGNOSIS — Z79899 Other long term (current) drug therapy: Secondary | ICD-10-CM | POA: Diagnosis not present

## 2022-06-25 DIAGNOSIS — G546 Phantom limb syndrome with pain: Secondary | ICD-10-CM | POA: Diagnosis not present

## 2022-06-25 DIAGNOSIS — M79609 Pain in unspecified limb: Secondary | ICD-10-CM | POA: Diagnosis not present

## 2022-06-25 DIAGNOSIS — Z79891 Long term (current) use of opiate analgesic: Secondary | ICD-10-CM | POA: Diagnosis not present

## 2022-07-23 DIAGNOSIS — Z79899 Other long term (current) drug therapy: Secondary | ICD-10-CM | POA: Diagnosis not present

## 2022-07-23 DIAGNOSIS — G894 Chronic pain syndrome: Secondary | ICD-10-CM | POA: Diagnosis not present

## 2022-07-23 DIAGNOSIS — Z79891 Long term (current) use of opiate analgesic: Secondary | ICD-10-CM | POA: Diagnosis not present

## 2022-07-23 DIAGNOSIS — M79609 Pain in unspecified limb: Secondary | ICD-10-CM | POA: Diagnosis not present

## 2022-07-23 DIAGNOSIS — G546 Phantom limb syndrome with pain: Secondary | ICD-10-CM | POA: Diagnosis not present

## 2022-08-10 ENCOUNTER — Other Ambulatory Visit: Payer: Self-pay | Admitting: Family Medicine

## 2022-08-10 DIAGNOSIS — I10 Essential (primary) hypertension: Secondary | ICD-10-CM

## 2022-08-10 DIAGNOSIS — E785 Hyperlipidemia, unspecified: Secondary | ICD-10-CM

## 2022-08-17 ENCOUNTER — Ambulatory Visit (INDEPENDENT_AMBULATORY_CARE_PROVIDER_SITE_OTHER): Payer: Medicare HMO | Admitting: Nurse Practitioner

## 2022-08-17 ENCOUNTER — Encounter: Payer: Self-pay | Admitting: Nurse Practitioner

## 2022-08-17 VITALS — BP 123/73 | HR 84 | Ht 70.0 in | Wt 217.1 lb

## 2022-08-17 DIAGNOSIS — J4520 Mild intermittent asthma, uncomplicated: Secondary | ICD-10-CM

## 2022-08-17 DIAGNOSIS — I10 Essential (primary) hypertension: Secondary | ICD-10-CM | POA: Diagnosis not present

## 2022-08-17 DIAGNOSIS — R7303 Prediabetes: Secondary | ICD-10-CM

## 2022-08-17 DIAGNOSIS — E039 Hypothyroidism, unspecified: Secondary | ICD-10-CM

## 2022-08-17 DIAGNOSIS — R634 Abnormal weight loss: Secondary | ICD-10-CM

## 2022-08-17 DIAGNOSIS — G629 Polyneuropathy, unspecified: Secondary | ICD-10-CM | POA: Diagnosis not present

## 2022-08-17 MED ORDER — LISINOPRIL 5 MG PO TABS
5.0000 mg | ORAL_TABLET | Freq: Every day | ORAL | 0 refills | Status: DC
Start: 2022-08-17 — End: 2022-10-20

## 2022-08-17 NOTE — Progress Notes (Signed)
Established patient visit   Patient: Frances Finley   DOB: January 05, 1956   67 y.o. Female  MRN: [id number] Visit Date: 08/17/2022   Chief Complaint  Patient presents with   Ear Fullness   Subjective    HPI  Follow up  -asthma  -hypothyroid  -hypertension  --blood pressure elevated at time of visit.  ?need for bone density test. (GYN provider) -hx below the knee amputation   Elevated blood pressure -buzzing in her ears.  -blood pressure has been going up for the past six months.  -shakiness. Weakness.   -increased personal stress -difficulty sleeping.   Unintentional weight loss  - 30 pounds over last 8 months.  -has been prediabetic for some time . -concern that blood sugars may be causing the shakiness and buzzing sensation in her ears.     Medications: Outpatient Medications Prior to Visit  Medication Sig   COMIRNATY SUSP injection    FLUCELVAX QUADRIVALENT 0.5 ML injection    albuterol (VENTOLIN HFA) 108 (90 Base) MCG/ACT inhaler INHALE 2 PUFFS BY MOUTH EVERY 6 HOURS AS NEEDED FOR WHEEZING.   baclofen (LIORESAL) 10 MG tablet Take 1 tablet (10 mg total) by mouth 2 (two) times daily as needed for muscle spasms. (Patient not taking: Reported on 08/17/2022)   diazepam (VALIUM) 5 MG tablet Take 1 tablet (5 mg total) by mouth daily as needed for muscle spasms. (Patient not taking: Reported on 08/17/2022)   esomeprazole (NEXIUM) 40 MG capsule Take 1 capsule (40 mg total) by mouth daily.   fluticasone-salmeterol (ADVAIR DISKUS) 250-50 MCG/ACT AEPB Inhale 1 puff into the lungs in the morning and at bedtime.   gabapentin (NEURONTIN) 600 MG tablet TAKE 1 TABLET BY MOUTH 4 TIMES A DAY   hydrochlorothiazide (HYDRODIURIL) 25 MG tablet TAKE 1 TABLET BY MOUTH EACH MORNING   HYDROCHLOROTHIAZIDE PO Take 25 mg by mouth daily.   levothyroxine (SYNTHROID) 137 MCG tablet Take 137 mcg by mouth every morning.   levothyroxine (SYNTHROID, LEVOTHROID) 100 MCG tablet Take 1 tablet (100 mcg  total) by mouth daily before breakfast. (Patient not taking: Reported on 04/14/2022)   montelukast (SINGULAIR) 10 MG tablet Take 1 tablet (10 mg total) by mouth at bedtime.   oxyCODONE (OXY IR/ROXICODONE) 5 MG immediate release tablet Take 1-2 tablets (5-10 mg total) by mouth every 4 (four) hours as needed.   potassium chloride SA (K-DUR,KLOR-CON) 20 MEQ tablet Take 1 tablet (20 mEq total) by mouth 2 (two) times daily.   simvastatin (ZOCOR) 20 MG tablet TAKE 1 TABLET BY MOUTH EVERY EVENING.   Vibegron (GEMTESA) 75 MG TABS Take 1 tablet by mouth daily.   No facility-administered medications prior to visit.   Past Surgical History:  Procedure Laterality Date   ABDOMINAL HYSTERECTOMY     BELOW KNEE LEG AMPUTATION  2003   CHOLECYSTECTOMY      Review of Systems See HPI     Last CBC Lab Results  Component Value Date   WBC 7.4 09/07/2022   HGB 15.7 (H) 09/07/2022   HCT 46.2 (H) 09/07/2022   MCV 89.0 09/07/2022   MCH 30.3 09/07/2022   RDW 12.7 09/07/2022   PLT 216 09/07/2022   Last metabolic panel Lab Results  Component Value Date   GLUCOSE 98 09/07/2022   NA 133 (L) 09/07/2022   K 3.4 (L) 09/07/2022   CL 93 (L) 09/07/2022   CO2 29 09/07/2022   BUN 11 09/07/2022   CREATININE 0.56 09/07/2022   EGFR 98 08/24/2022  CALCIUM 9.3 09/07/2022   PROT 7.7 09/07/2022   ALBUMIN 4.1 09/07/2022   LABGLOB 2.7 08/24/2022   AGRATIO 1.6 08/24/2022   BILITOT 1.1 09/07/2022   ALKPHOS 41 09/07/2022   AST 17 09/07/2022   ALT 23 09/07/2022   ANIONGAP 11 09/07/2022   Last lipids Lab Results  Component Value Date   CHOL 176 08/24/2022   HDL 92 08/24/2022   LDLCALC 72 08/24/2022   TRIG 62 08/24/2022   CHOLHDL 1.9 08/24/2022   Last hemoglobin A1c Lab Results  Component Value Date   HGBA1C 5.8 (H) 08/24/2022   Last thyroid functions Lab Results  Component Value Date   TSH 0.156 (L) 08/24/2022   Last vitamin D Lab Results  Component Value Date   VD25OH 61.9 12/10/2021        Objective     Today's Vitals   08/17/22 1517 08/17/22 1554  BP: (Abnormal) 153/67 123/73  Pulse: 84   SpO2: 93%   Weight: 217 lb 1.9 oz (98.5 kg)   Height: 5\' 10"  (1.778 m)    Body mass index is 31.15 kg/m.  BP Readings from Last 3 Encounters:  09/07/22 (Abnormal) 141/73  08/17/22 123/73  04/14/22 125/77    Wt Readings from Last 3 Encounters:  09/07/22 216 lb 14.9 oz (98.4 kg)  08/17/22 217 lb 1.9 oz (98.5 kg)  04/14/22 230 lb 12.8 oz (104.7 kg)    Physical Exam Vitals and nursing note reviewed.  Constitutional:      Appearance: Normal appearance. She is well-developed.  HENT:     Head: Normocephalic and atraumatic.     Nose: Nose normal.     Mouth/Throat:     Mouth: Mucous membranes are moist.     Pharynx: Oropharynx is clear.  Eyes:     Extraocular Movements: Extraocular movements intact.     Conjunctiva/sclera: Conjunctivae normal.     Pupils: Pupils are equal, round, and reactive to light.  Neck:     Vascular: No carotid bruit.  Cardiovascular:     Rate and Rhythm: Normal rate and regular rhythm.     Pulses: Normal pulses.     Heart sounds: Normal heart sounds.  Pulmonary:     Effort: Pulmonary effort is normal.     Breath sounds: Normal breath sounds.  Abdominal:     Palpations: Abdomen is soft.  Musculoskeletal:        General: Normal range of motion.     Cervical back: Normal range of motion and neck supple.  Lymphadenopathy:     Cervical: No cervical adenopathy.  Skin:    General: Skin is warm and dry.     Capillary Refill: Capillary refill takes less than 2 seconds.  Neurological:     General: No focal deficit present.     Mental Status: She is alert and oriented to person, place, and time.  Psychiatric:        Attention and Perception: Attention and perception normal.        Mood and Affect: Affect normal. Mood is anxious.        Speech: Speech normal.        Behavior: Behavior normal. Behavior is cooperative.        Thought Content: Thought  content normal.        Cognition and Memory: Cognition and memory normal.        Judgment: Judgment normal.       Assessment & Plan    Essential hypertension Assessment & Plan: -add  lisinopril 5 mg daily  -continue HCTZ 25 mg daily  -DASH Diet: Care Instructions  Your Care Instructions  The DASH diet is an eating plan that can help lower your blood pressure. DASH stands for Dietary Approaches to Stop Hypertension. Hypertension is high blood pressure. The DASH diet focuses on eating foods that are high in calcium, potassium, and magnesium. These nutrients can lower blood pressure. The foods that are highest in these nutrients are fruits, vegetables, low-fat dairy products, nuts, seeds, and legumes. But taking calcium, potassium, and magnesium supplements instead of eating foods that are high in those nutrients does not have the same effect. The DASH diet also includes whole grains, fish, and poultry. The DASH diet is one of several lifestyle changes your doctor may recommend to lower your high blood pressure. Your doctor may also want you to decrease the amount of sodium in your diet. Lowering sodium while following the DASH diet can lower blood pressure even further than just the DASH diet alone. Follow-up care is a key part of your treatment and safety. Be sure to make and go to all appointments, and call your doctor if you are having problems. It's also a good idea to know your test results and keep a list of the medicines you take. How can you care for yourself at home? Following the DASH diet  Eat 4 to 5 servings of fruit each day. A serving is 1 medium-sized piece of fruit,  cup chopped or canned fruit, 1/4 cup dried fruit, or 4 ounces ( cup) of fruit juice. Choose fruit more often than fruit juice.  Eat 4 to 5 servings of vegetables each day. A serving is 1 cup of lettuce or raw leafy vegetables,  cup of chopped or cooked vegetables, or 4 ounces ( cup) of vegetable juice. Choose  vegetables more often than vegetable juice.  Get 2 to 3 servings of low-fat and fat-free dairy each day. A serving is 8 ounces of milk, 1 cup of yogurt, or 1  ounces of cheese.  Eat 6 to 8 servings of grains each day. A serving is 1 slice of bread, 1 ounce of dry cereal, or  cup of cooked rice, pasta, or cooked cereal. Try to choose whole-grain products as much as possible.  Limit lean meat, poultry, and fish to 2 servings each day. A serving is 3 ounces, about the size of a deck of cards.  Eat 4 to 5 servings of nuts, seeds, and legumes (cooked dried beans, lentils, and split peas) each week. A serving is 1/3 cup of nuts, 2 tablespoons of seeds, or  cup of cooked beans or peas.  Limit fats and oils to 2 to 3 servings each day. A serving is 1 teaspoon of vegetable oil or 2 tablespoons of salad dressing.  Limit sweets and added sugars to 5 servings or less a week. A serving is 1 tablespoon jelly or jam,  cup sorbet, or 1 cup of lemonade.  Eat less than 2,300 milligrams (mg) of sodium a day. If you limit your sodium to 1,500 mg a day, you can lower your blood pressure even more. Tips for success  Start small. Do not try to make dramatic changes to your diet all at once. You might feel that you are missing out on your favorite foods and then be more likely to not follow the plan. Make small changes, and stick with them. Once those changes become habit, add a few more changes.  Try some of  the following: ? Make it a goal to eat a fruit or vegetable at every meal and at snacks. This will make it easy to get the recommended amount of fruits and vegetables each day. ? Try yogurt topped with fruit and nuts for a snack or healthy dessert. ? Add lettuce, tomato, cucumber, and onion to sandwiches. ? Combine a ready-made pizza crust with low-fat mozzarella cheese and lots of vegetable toppings. Try using tomatoes, squash, spinach, broccoli, carrots, cauliflower, and onions. ? Have a variety of cut-up  vegetables with a low-fat dip as an appetizer instead of chips and dip. ? Sprinkle sunflower seeds or chopped almonds over salads. Or try adding chopped walnuts or almonds to cooked vegetables. ? Try some vegetarian meals using beans and peas. Add garbanzo or kidney beans to salads. Make burritos and tacos with mashed pinto beans or black beans.   Reassess in 6 weeks    Orders: -     Lisinopril; Take 1 tablet (5 mg total) by mouth daily.  Dispense: 90 tablet; Refill: 0  Abnormal weight loss Assessment & Plan: Check labs including thyroid panel for further evaluation.  -adjust dose levothyroxine as indicated    Acquired hypothyroidism Assessment & Plan: Check thyroid panel and adjust levothyroxine as indicated   Prediabetes Assessment & Plan: Check labs with HgbA1c for further evaluation.    Mild intermittent asthma without complication Assessment & Plan: Stable.  -continue inhaled medications as prescribed    Neuropathy Assessment & Plan: -Doing well with current gabapentin treatment       Return in about 6 weeks (around 09/28/2022) for . , blood pressure. blood work in next few days. MWV before end of year. Marland Kitchen         Carlean Jews, NP  [facility] at [facility] [phone] (phone) [phone] (fax)  [facility]

## 2022-08-20 DIAGNOSIS — Z79891 Long term (current) use of opiate analgesic: Secondary | ICD-10-CM | POA: Diagnosis not present

## 2022-08-20 DIAGNOSIS — G894 Chronic pain syndrome: Secondary | ICD-10-CM | POA: Diagnosis not present

## 2022-08-20 DIAGNOSIS — M79609 Pain in unspecified limb: Secondary | ICD-10-CM | POA: Diagnosis not present

## 2022-08-20 DIAGNOSIS — Z79899 Other long term (current) drug therapy: Secondary | ICD-10-CM | POA: Diagnosis not present

## 2022-08-20 DIAGNOSIS — G546 Phantom limb syndrome with pain: Secondary | ICD-10-CM | POA: Diagnosis not present

## 2022-08-24 ENCOUNTER — Other Ambulatory Visit: Payer: Medicare HMO

## 2022-08-24 DIAGNOSIS — E039 Hypothyroidism, unspecified: Secondary | ICD-10-CM | POA: Diagnosis not present

## 2022-08-24 DIAGNOSIS — Z130 Encounter for screening for diseases of the blood and blood-forming organs and certain disorders involving the immune mechanism: Secondary | ICD-10-CM | POA: Diagnosis not present

## 2022-08-24 DIAGNOSIS — I10 Essential (primary) hypertension: Secondary | ICD-10-CM | POA: Diagnosis not present

## 2022-08-24 DIAGNOSIS — R7303 Prediabetes: Secondary | ICD-10-CM | POA: Diagnosis not present

## 2022-08-24 DIAGNOSIS — Z1321 Encounter for screening for nutritional disorder: Secondary | ICD-10-CM | POA: Diagnosis not present

## 2022-08-24 DIAGNOSIS — R7309 Other abnormal glucose: Secondary | ICD-10-CM | POA: Diagnosis not present

## 2022-08-24 DIAGNOSIS — Z0000 Encounter for general adult medical examination without abnormal findings: Secondary | ICD-10-CM

## 2022-08-24 DIAGNOSIS — Z13228 Encounter for screening for other metabolic disorders: Secondary | ICD-10-CM | POA: Diagnosis not present

## 2022-08-24 DIAGNOSIS — Z1329 Encounter for screening for other suspected endocrine disorder: Secondary | ICD-10-CM | POA: Diagnosis not present

## 2022-08-25 LAB — HEMOGLOBIN A1C
Est. average glucose Bld gHb Est-mCnc: 120 mg/dL
Hgb A1c MFr Bld: 5.8 % — ABNORMAL HIGH (ref 4.8–5.6)

## 2022-08-25 LAB — COMPREHENSIVE METABOLIC PANEL
ALT: 21 IU/L (ref 0–32)
AST: 13 IU/L (ref 0–40)
Albumin/Globulin Ratio: 1.6 (ref 1.2–2.2)
Albumin: 4.3 g/dL (ref 3.9–4.9)
Alkaline Phosphatase: 47 IU/L (ref 44–121)
BUN/Creatinine Ratio: 16 (ref 12–28)
BUN: 10 mg/dL (ref 8–27)
Bilirubin Total: 0.6 mg/dL (ref 0.0–1.2)
CO2: 28 mmol/L (ref 20–29)
Calcium: 10 mg/dL (ref 8.7–10.3)
Chloride: 93 mmol/L — ABNORMAL LOW (ref 96–106)
Creatinine, Ser: 0.63 mg/dL (ref 0.57–1.00)
Globulin, Total: 2.7 g/dL (ref 1.5–4.5)
Glucose: 126 mg/dL — ABNORMAL HIGH (ref 70–99)
Potassium: 4.7 mmol/L (ref 3.5–5.2)
Sodium: 138 mmol/L (ref 134–144)
Total Protein: 7 g/dL (ref 6.0–8.5)
eGFR: 98 mL/min/{1.73_m2} (ref >59)

## 2022-08-25 LAB — LIPID PANEL
Chol/HDL Ratio: 1.9 ratio (ref 0.0–4.4)
Cholesterol, Total: 176 mg/dL (ref 100–199)
HDL: 92 mg/dL (ref >39)
LDL Chol Calc (NIH): 72 mg/dL (ref 0–99)
Triglycerides: 62 mg/dL (ref 0–149)
VLDL Cholesterol Cal: 12 mg/dL (ref 5–40)

## 2022-08-25 LAB — TSH: TSH: 0.156 u[IU]/mL — ABNORMAL LOW (ref 0.450–4.500)

## 2022-08-25 LAB — CBC
Hematocrit: 48.1 % — ABNORMAL HIGH (ref 34.0–46.6)
Hemoglobin: 16 g/dL — ABNORMAL HIGH (ref 11.1–15.9)
MCH: 30 pg (ref 26.6–33.0)
MCHC: 33.3 g/dL (ref 31.5–35.7)
MCV: 90 fL (ref 79–97)
Platelets: 251 10*3/uL (ref 150–450)
RBC: 5.33 x10E6/uL — ABNORMAL HIGH (ref 3.77–5.28)
RDW: 12.6 % (ref 11.7–15.4)
WBC: 5.7 10*3/uL (ref 3.4–10.8)

## 2022-09-07 ENCOUNTER — Other Ambulatory Visit: Payer: Self-pay

## 2022-09-07 ENCOUNTER — Emergency Department (HOSPITAL_COMMUNITY): Payer: Medicare HMO

## 2022-09-07 ENCOUNTER — Other Ambulatory Visit (HOSPITAL_COMMUNITY): Payer: Medicare HMO

## 2022-09-07 ENCOUNTER — Emergency Department (HOSPITAL_COMMUNITY)
Admission: EM | Admit: 2022-09-07 | Discharge: 2022-09-07 | Disposition: A | Discharge status: Home / Self Care | Payer: Medicare HMO | Attending: Emergency Medicine | Admitting: Emergency Medicine

## 2022-09-07 ENCOUNTER — Encounter (HOSPITAL_COMMUNITY): Payer: Self-pay

## 2022-09-07 DIAGNOSIS — D582 Other hemoglobinopathies: Secondary | ICD-10-CM | POA: Insufficient documentation

## 2022-09-07 DIAGNOSIS — R202 Paresthesia of skin: Secondary | ICD-10-CM | POA: Insufficient documentation

## 2022-09-07 DIAGNOSIS — E876 Hypokalemia: Secondary | ICD-10-CM | POA: Insufficient documentation

## 2022-09-07 DIAGNOSIS — R29818 Other symptoms and signs involving the nervous system: Secondary | ICD-10-CM | POA: Diagnosis not present

## 2022-09-07 DIAGNOSIS — R200 Anesthesia of skin: Secondary | ICD-10-CM | POA: Diagnosis not present

## 2022-09-07 DIAGNOSIS — I6523 Occlusion and stenosis of bilateral carotid arteries: Secondary | ICD-10-CM | POA: Diagnosis not present

## 2022-09-07 LAB — CBC WITH DIFFERENTIAL/PLATELET
Abs Immature Granulocytes: 0.03 10*3/uL (ref 0.00–0.07)
Basophils Absolute: 0.1 10*3/uL (ref 0.0–0.1)
Basophils Relative: 1 %
Eosinophils Absolute: 0.2 10*3/uL (ref 0.0–0.5)
Eosinophils Relative: 3 %
HCT: 46.2 % — ABNORMAL HIGH (ref 36.0–46.0)
Hemoglobin: 15.7 g/dL — ABNORMAL HIGH (ref 12.0–15.0)
Immature Granulocytes: 0 %
Lymphocytes Relative: 24 %
Lymphs Abs: 1.8 10*3/uL (ref 0.7–4.0)
MCH: 30.3 pg (ref 26.0–34.0)
MCHC: 34 g/dL (ref 30.0–36.0)
MCV: 89 fL (ref 80.0–100.0)
Monocytes Absolute: 0.6 10*3/uL (ref 0.1–1.0)
Monocytes Relative: 9 %
Neutro Abs: 4.7 10*3/uL (ref 1.7–7.7)
Neutrophils Relative %: 63 %
Platelets: 216 10*3/uL (ref 150–400)
RBC: 5.19 MIL/uL — ABNORMAL HIGH (ref 3.87–5.11)
RDW: 12.7 % (ref 11.5–15.5)
WBC: 7.4 10*3/uL (ref 4.0–10.5)
nRBC: 0 % (ref 0.0–0.2)

## 2022-09-07 LAB — COMPREHENSIVE METABOLIC PANEL
ALT: 23 U/L (ref 0–44)
AST: 17 U/L (ref 15–41)
Albumin: 4.1 g/dL (ref 3.5–5.0)
Alkaline Phosphatase: 41 U/L (ref 38–126)
Anion gap: 11 (ref 5–15)
BUN: 11 mg/dL (ref 8–23)
CO2: 29 mmol/L (ref 22–32)
Calcium: 9.3 mg/dL (ref 8.9–10.3)
Chloride: 93 mmol/L — ABNORMAL LOW (ref 98–111)
Creatinine, Ser: 0.56 mg/dL (ref 0.44–1.00)
GFR, Estimated: >60 mL/min (ref >60)
Glucose, Bld: 98 mg/dL (ref 70–99)
Potassium: 3.4 mmol/L — ABNORMAL LOW (ref 3.5–5.1)
Sodium: 133 mmol/L — ABNORMAL LOW (ref 135–145)
Total Bilirubin: 1.1 mg/dL (ref 0.3–1.2)
Total Protein: 7.7 g/dL (ref 6.5–8.1)

## 2022-09-07 LAB — MAGNESIUM: Magnesium: 1.6 mg/dL — ABNORMAL LOW (ref 1.7–2.4)

## 2022-09-07 MED ORDER — AMOXICILLIN-POT CLAVULANATE 875-125 MG PO TABS: 1.0000 | ORAL_TABLET | Freq: Two times a day (BID) | ORAL | 0 refills | Status: DC

## 2022-09-07 MED ORDER — MAGNESIUM OXIDE 400 MG PO CAPS: 400.0000 mg | ORAL_CAPSULE | Freq: Every day | ORAL | 0 refills | Status: DC

## 2022-09-07 MED ORDER — LORAZEPAM 2 MG/ML IJ SOLN
1.0000 mg | Freq: Once | INTRAMUSCULAR | Status: AC
  Administered 2022-09-07: 1 mg via INTRAVENOUS
  Filled 2022-09-07: qty 1

## 2022-09-07 MED ORDER — IOHEXOL 350 MG/ML SOLN
75.0000 mL | Freq: Once | INTRAVENOUS | Status: AC | PRN
  Administered 2022-09-07: 75 mL via INTRAVENOUS

## 2022-09-07 MED ORDER — POTASSIUM CHLORIDE CRYS ER 20 MEQ PO TBCR: 20.0000 meq | EXTENDED_RELEASE_TABLET | Freq: Two times a day (BID) | ORAL | 0 refills | Status: DC

## 2022-09-07 MED ORDER — MAGNESIUM OXIDE 400 MG PO CAPS: 400.0000 mg | ORAL_CAPSULE | Freq: Every day | ORAL | 0 refills | Status: AC

## 2022-09-07 NOTE — ED Provider Triage Note (Signed)
Emergency Medicine Provider Triage Evaluation Note  Frances Finley , a 67 y.o. female  was evaluated in triage.  Pt complains of right sided face numbness that began a week ago.  She states it began in her right cheek, moved to her nose and left cheek.  Denies problems with speaking or swallowing.  Denies weakness in her extremities, headache, vision changes, slurred speech, facial droop.    Review of Systems  Positive: As above Negative: As above  Physical Exam  Temp 98.3 F (36.8 C) (Oral)   Ht 5\' 10"  (1.778 m)   Wt 98.4 kg   BMI 31.13 kg/m  Gen:   Awake, no distress   Resp:  Normal effort  MSK:   Moves extremities without difficulty  Other:  5/5 strength in bilateral upper extremities and 5/5 strength in RLE.  Reduced sensation to right side of face when compared to left.  Normal brow raise.  No facial droop or slurred speech.  No numbness aside from face.   Medical Decision Making  Medically screening exam initiated at 8:22 PM.  Appropriate orders placed.  Frances Finley was informed that the remainder of the evaluation will be completed by another provider, this initial triage assessment does not replace that evaluation, and the importance of remaining in the ED until their evaluation is complete.     Melton Alar R, PA-C 09/07/22 2027

## 2022-09-07 NOTE — ED Triage Notes (Signed)
Patient reports right sided face numbness that started a week ago, then moved to the left side and to her nose. States nose and gums only feel numb at this time, also endorses this evening she felt like her throat was swelling and her voice changed. Denies any issues with eating and drinking.

## 2022-09-07 NOTE — ED Provider Notes (Signed)
[place] EMERGENCY DEPARTMENT AT [facility] Provider Note   CSN: [id number] Arrival date & time: 09/07/22  1945     History Chief Complaint  Patient presents with   Numbness    HPI Frances Finley is a 67 y.o. female presenting for multiple symptoms. She stated that she has had right-sided facial numbness for 1 week.  She does feel some abnormal sensation in her throat and has been having tinnitus over the past 2 months.  She describes it as a Novocain on the right side of her head.  States that symptoms have been persistent denies fevers chills nausea vomiting syncope or shortness of breath.  Otherwise ambulatory tolerating p.o. intake.  No known sick contacts. Fortunately patient denies dizziness, fevers chills nausea vomiting syncope shortness of breath or lightheadedness.  She is ambulatory tolerating p.o. intake. 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 Ambulatory and tolerating PO intake.   HX of Hemochromatosis,SDH, and vascular abnormality  [name] recorded medical, surgical, social, medication list and allergies were reviewed in the Snapshot window as part of the initial history.   Review of Systems   Review of Systems  Constitutional:  Negative for chills and fever.  HENT:  Negative for ear pain and sore throat.   Eyes:  Negative for pain and visual disturbance.  Respiratory:  Negative for cough and shortness of breath.   Cardiovascular:  Negative for chest pain and palpitations.  Gastrointestinal:  Negative for abdominal pain and vomiting.  Genitourinary:  Negative for dysuria and hematuria.  Musculoskeletal:  Negative for arthralgias and back pain.  Skin:  Negative for color change and rash.  Neurological:  Positive for numbness. Negative for seizures and syncope.  All other systems reviewed and are negative.   Physical  Exam Updated Vital Signs BP (!) 141/73   Pulse 86   Temp 98.3 F (36.8 C) (Oral)   Resp 18   Ht 5\' 10"  (1.778 m)   Wt 98.4 kg   SpO2 94%   BMI 31.13 kg/m  Physical Exam Vitals and nursing note reviewed.  Constitutional:      General: She is not in acute distress.    Appearance: She is well-developed.  HENT:     Head: Normocephalic and atraumatic.  Eyes:     Conjunctiva/sclera: Conjunctivae normal.  Cardiovascular:     Rate and Rhythm: Normal rate and regular rhythm.     Heart sounds: No murmur heard. Pulmonary:     Effort: Pulmonary effort is normal. No respiratory distress.     Breath sounds: Normal breath sounds.  Abdominal:     General: There is no distension.     Palpations: Abdomen is soft.     Tenderness: There is no abdominal tenderness. There is no right CVA tenderness or left CVA tenderness.  Musculoskeletal:        General: No swelling or tenderness. Normal range of motion.     Cervical back: Neck supple.  Skin:    General: Skin is warm and dry.  Neurological:     Mental Status: She is alert and oriented to person, place, and time. Mental status is at baseline.     Cranial Nerves: No cranial nerve deficit.     Comments: Upper portion of patient's forehead is completely numb.  Muscularly intact.  Patient cannot feel across to right maxilla.  Sensation returns to normal along her right jaw.  ED Course/ Medical Decision Making/ A&P    Procedures Procedures   Medications Ordered in ED Medications  iohexol (OMNIPAQUE) 350 MG/ML injection 75 mL (75 mLs Intravenous Contrast Given 09/07/22 2146)  LORazepam (ATIVAN) injection 1 mg (1 mg Intravenous Given 09/07/22 2157)    Medical Decision Making:    Frances Finley is a 67 y.o. female who presented to the ED today with multiple complaints detailed above.     Complete initial physical exam performed, notably the patient  was hemodynamically stable no acute distress.      Reviewed and confirmed nursing  documentation for past medical history, family history, social history.    Initial Assessment:   Patient with atypical distribution of symptoms.  Appears to be primarily sensory in her face involving branches of V1 V2 of the trigeminal nerve.  Only in a sensory pattern for upper and mid face.  With trigeminal V3 distribution not involved. Differential is broad and includes stroke, intracranial hemorrhage, intracranial mass, vascular dissection, metabolic disruption, infection.  More likely, this may be an atypical presentation of trigeminal neuralgia.  Broad diagnostic evaluation as below Initial Plan:  CT a of the head and neck to evaluate for vascular disruption/recurrent intracranial bleed MRI brain to evaluate for CVA or other ischemic disease. Screening labs including CBC and Metabolic panel to evaluate for infectious or metabolic etiology of disease.  Objective evaluation as below reviewed with plan for close reassessment  Initial Study Results:   Laboratory  All laboratory results reviewed without evidence of clinically relevant pathology.   Exception includes mild electrolyte abnormalities including mild hypokalemia and mild hypomagnesemia.  Additionally she has elevated hemoglobin level for female in the setting of hemochromatosis  Radiology  All images reviewed independently. Agree with radiology report at this time.   MR BRAIN WO CONTRAST  Result Date: 09/07/2022 CLINICAL DATA:  Acute neurologic deficit EXAM: MRI HEAD WITHOUT CONTRAST TECHNIQUE: Multiplanar, multiecho pulse sequences of the brain and surrounding structures were obtained without intravenous contrast. COMPARISON:  None Available. FINDINGS: Brain: No acute infarct, mass effect or extra-axial collection. No acute or chronic hemorrhage. There is multifocal hyperintense T2-weighted signal within the white matter. Parenchymal volume and CSF spaces are normal. The midline structures are normal. Vascular: Major flow voids are  preserved. Skull and upper cervical spine: Normal calvarium and skull base. Visualized upper cervical spine and soft tissues are normal. Sinuses/Orbits:No paranasal sinus fluid levels or advanced mucosal thickening. No mastoid or middle ear effusion. Normal orbits. IMPRESSION: 1. No acute intracranial abnormality. 2. Multifocal hyperintense T2-weighted signal within the white matter, nonspecific but most commonly seen in the setting of chronic small vessel ischemia. Electronically Signed   By: Deatra Robinson M.D.   On: 09/07/2022 22:40   CT ANGIO HEAD NECK W WO CM  Result Date: 09/07/2022 CLINICAL DATA:  Right facial numbness EXAM: CT ANGIOGRAPHY HEAD AND NECK WITH AND WITHOUT CONTRAST TECHNIQUE: Multidetector CT imaging of the head and neck was performed using the standard protocol during bolus administration of intravenous contrast. Multiplanar CT image reconstructions and MIPs were obtained to evaluate the vascular anatomy. Carotid stenosis measurements (when applicable) are obtained utilizing NASCET criteria, using the distal internal carotid diameter as the denominator. RADIATION DOSE REDUCTION: This exam was performed according to the departmental dose-optimization program which includes automated exposure control, adjustment of the mA and/or kV according to patient size and/or use of iterative reconstruction technique. CONTRAST:  75mL OMNIPAQUE IOHEXOL 350 MG/ML SOLN COMPARISON:  None Available. FINDINGS: CT HEAD  FINDINGS Brain: There is no mass, hemorrhage or extra-axial collection. The size and configuration of the ventricles and extra-axial CSF spaces are normal. There is no acute or chronic infarction. The brain parenchyma is normal. Skull: The visualized skull base, calvarium and extracranial soft tissues are normal. Sinuses/Orbits: No fluid levels or advanced mucosal thickening of the visualized paranasal sinuses. No mastoid or middle ear effusion. The orbits are normal. CTA NECK FINDINGS SKELETON:  There is no bony spinal canal stenosis. No lytic or blastic lesion. OTHER NECK: Normal pharynx, larynx and major salivary glands. No cervical lymphadenopathy. Unremarkable thyroid gland. UPPER CHEST: No pneumothorax or pleural effusion. No nodules or masses. AORTIC ARCH: There is calcific atherosclerosis of the aortic arch. There is no aneurysm, dissection or hemodynamically significant stenosis of the visualized portion of the aorta. Conventional 3 vessel aortic branching pattern. The visualized proximal subclavian arteries are widely patent. RIGHT CAROTID SYSTEM: No dissection, occlusion or aneurysm. There is calcified atherosclerosis extending into the proximal ICA, resulting in less than 50% stenosis. LEFT CAROTID SYSTEM: No dissection, occlusion or aneurysm. There is calcified atherosclerosis extending into the proximal ICA, resulting in less than 50% stenosis. VERTEBRAL ARTERIES: Left dominant configuration. Both origins are clearly patent. There is no dissection, occlusion or flow-limiting stenosis to the skull base (V1-V3 segments). CTA HEAD FINDINGS POSTERIOR CIRCULATION: --Vertebral arteries: Mild calcification --Inferior cerebellar arteries: Normal. --Basilar artery: Normal. --Superior cerebellar arteries: Normal. --Posterior cerebral arteries (PCA): Normal. ANTERIOR CIRCULATION: --Intracranial internal carotid arteries: Atherosclerotic calcification of the internal carotid arteries at the skull base without hemodynamically significant stenosis. --Anterior cerebral arteries (ACA): Normal. Both A1 segments are present. Patent anterior communicating artery (a-comm). --Middle cerebral arteries (MCA): Normal. VENOUS SINUSES: As permitted by contrast timing, patent. ANATOMIC VARIANTS: None Review of the MIP images confirms the above findings. IMPRESSION: 1. No emergent large vessel occlusion or hemodynamically significant stenosis of the head or neck. 2. Bilateral carotid bifurcation atherosclerosis without  hemodynamically significant stenosis. Electronically Signed   By: Deatra Robinson M.D.   On: 09/07/2022 22:17     Final Assessment and Plan:   [name] history of present illness and physical exam findings revealed no acute pathology today. CTA and MRI with no focal findings.  She does still have his cheek numbness.  But no evidence of stroke or other acute pathology. Discussed with patient.  Possible sinus infection causing the symptoms of facial numbness given duration of congestion and rhinorrhea.  Also considered trigeminal neuralgia with an atypical neuropathy.  May be related to electrolyte abnormalities.  CVA seems less likely based on reassuring MRI and vascular dissection is ruled out on studies today.  Shared medical decision making with patient.  We could send over to main campus for neurologic consultation to [facility] however patient feels comfortable outpatient care management PCP, treatment for electrolyte abnormalities and treatment of sinus infection with strict return precautions importance of returning with worsening symptoms reinforced Disposition:  I have considered need for hospitalization, however, considering all of the above, I believe this patient is stable for discharge at this time.  Patient/family educated about specific return precautions for given chief complaint and symptoms.  Patient/family educated about follow-up with PCP Patient/family expressed understanding of return precautions and need for follow-up. Patient spoken to regarding all imaging and laboratory results and appropriate follow up for these results. All education provided in verbal form with additional information in written form. Time was allowed for answering of patient questions. Patient discharged.    Emergency Department Medication  Summary:   Medications  iohexol (OMNIPAQUE) 350 MG/ML injection 75 mL (75 mLs Intravenous Contrast Given 09/07/22 2146)  LORazepam (ATIVAN) injection 1 mg (1 mg  Intravenous Given 09/07/22 2157)         Clinical Impression:  1. Paresthesia      Discharge   Final Clinical Impression(s) / ED Diagnoses Final diagnoses:  Paresthesia    Rx / DC Orders ED Discharge Orders          Ordered    potassium chloride SA (KLOR-CON M) 20 MEQ tablet  2 times daily        09/07/22 2255    Magnesium Oxide 400 MG CAPS  Daily        09/07/22 2255    amoxicillin-clavulanate (AUGMENTIN) 875-125 MG tablet  Every 12 hours        09/07/22 2255              Glyn Ade, MD 09/07/22 2307

## 2022-09-16 ENCOUNTER — Other Ambulatory Visit: Payer: Self-pay | Admitting: Nurse Practitioner

## 2022-09-16 DIAGNOSIS — J4520 Mild intermittent asthma, uncomplicated: Secondary | ICD-10-CM

## 2022-09-16 DIAGNOSIS — R634 Abnormal weight loss: Secondary | ICD-10-CM | POA: Insufficient documentation

## 2022-09-16 DIAGNOSIS — R7303 Prediabetes: Secondary | ICD-10-CM | POA: Insufficient documentation

## 2022-09-16 NOTE — Assessment & Plan Note (Signed)
Check labs with HgbA1c for further evaluation  

## 2022-09-16 NOTE — Assessment & Plan Note (Signed)
-  Doing well with current gabapentin treatment

## 2022-09-16 NOTE — Assessment & Plan Note (Signed)
Check labs including thyroid panel for further evaluation.  -adjust dose levothyroxine as indicated

## 2022-09-16 NOTE — Assessment & Plan Note (Signed)
Check thyroid panel and adjust levothyroxine as indicated  

## 2022-09-16 NOTE — Assessment & Plan Note (Signed)
Stable.  -continue inhaled medications as prescribed

## 2022-09-16 NOTE — Assessment & Plan Note (Signed)
-add lisinopril 5 mg daily  -continue HCTZ 25 mg daily  -DASH Diet: Care Instructions  Your Care Instructions  The DASH diet is an eating plan that can help lower your blood pressure. DASH stands for Dietary Approaches to Stop Hypertension. Hypertension is high blood pressure. The DASH diet focuses on eating foods that are high in calcium, potassium, and magnesium. These nutrients can lower blood pressure. The foods that are highest in these nutrients are fruits, vegetables, low-fat dairy products, nuts, seeds, and legumes. But taking calcium, potassium, and magnesium supplements instead of eating foods that are high in those nutrients does not have the same effect. The DASH diet also includes whole grains, fish, and poultry. The DASH diet is one of several lifestyle changes your doctor may recommend to lower your high blood pressure. Your doctor may also want you to decrease the amount of sodium in your diet. Lowering sodium while following the DASH diet can lower blood pressure even further than just the DASH diet alone. Follow-up care is a key part of your treatment and safety. Be sure to make and go to all appointments, and call your doctor if you are having problems. It's also a good idea to know your test results and keep a list of the medicines you take. How can you care for yourself at home? Following the DASH diet  Eat 4 to 5 servings of fruit each day. A serving is 1 medium-sized piece of fruit,  cup chopped or canned fruit, 1/4 cup dried fruit, or 4 ounces ( cup) of fruit juice. Choose fruit more often than fruit juice.  Eat 4 to 5 servings of vegetables each day. A serving is 1 cup of lettuce or raw leafy vegetables,  cup of chopped or cooked vegetables, or 4 ounces ( cup) of vegetable juice. Choose vegetables more often than vegetable juice.  Get 2 to 3 servings of low-fat and fat-free dairy each day. A serving is 8 ounces of milk, 1 cup of yogurt, or 1  ounces of cheese.  Eat 6  to 8 servings of grains each day. A serving is 1 slice of bread, 1 ounce of dry cereal, or  cup of cooked rice, pasta, or cooked cereal. Try to choose whole-grain products as much as possible.  Limit lean meat, poultry, and fish to 2 servings each day. A serving is 3 ounces, about the size of a deck of cards.  Eat 4 to 5 servings of nuts, seeds, and legumes (cooked dried beans, lentils, and split peas) each week. A serving is 1/3 cup of nuts, 2 tablespoons of seeds, or  cup of cooked beans or peas.  Limit fats and oils to 2 to 3 servings each day. A serving is 1 teaspoon of vegetable oil or 2 tablespoons of salad dressing.  Limit sweets and added sugars to 5 servings or less a week. A serving is 1 tablespoon jelly or jam,  cup sorbet, or 1 cup of lemonade.  Eat less than 2,300 milligrams (mg) of sodium a day. If you limit your sodium to 1,500 mg a day, you can lower your blood pressure even more. Tips for success  Start small. Do not try to make dramatic changes to your diet all at once. You might feel that you are missing out on your favorite foods and then be more likely to not follow the plan. Make small changes, and stick with them. Once those changes become habit, add a few more changes.  Try some  of the following: ? Make it a goal to eat a fruit or vegetable at every meal and at snacks. This will make it easy to get the recommended amount of fruits and vegetables each day. ? Try yogurt topped with fruit and nuts for a snack or healthy dessert. ? Add lettuce, tomato, cucumber, and onion to sandwiches. ? Combine a ready-made pizza crust with low-fat mozzarella cheese and lots of vegetable toppings. Try using tomatoes, squash, spinach, broccoli, carrots, cauliflower, and onions. ? Have a variety of cut-up vegetables with a low-fat dip as an appetizer instead of chips and dip. ? Sprinkle sunflower seeds or chopped almonds over salads. Or try adding chopped walnuts or almonds to cooked  vegetables. ? Try some vegetarian meals using beans and peas. Add garbanzo or kidney beans to salads. Make burritos and tacos with mashed pinto beans or black beans.   Reassess in 6 weeks

## 2022-09-24 DIAGNOSIS — Z1389 Encounter for screening for other disorder: Secondary | ICD-10-CM | POA: Diagnosis not present

## 2022-09-24 DIAGNOSIS — G894 Chronic pain syndrome: Secondary | ICD-10-CM | POA: Diagnosis not present

## 2022-09-24 DIAGNOSIS — Z79899 Other long term (current) drug therapy: Secondary | ICD-10-CM | POA: Diagnosis not present

## 2022-09-24 DIAGNOSIS — M79609 Pain in unspecified limb: Secondary | ICD-10-CM | POA: Diagnosis not present

## 2022-09-24 DIAGNOSIS — G546 Phantom limb syndrome with pain: Secondary | ICD-10-CM | POA: Diagnosis not present

## 2022-09-24 DIAGNOSIS — Z79891 Long term (current) use of opiate analgesic: Secondary | ICD-10-CM | POA: Diagnosis not present

## 2022-09-29 ENCOUNTER — Ambulatory Visit (INDEPENDENT_AMBULATORY_CARE_PROVIDER_SITE_OTHER): Payer: Medicare HMO | Admitting: Family Medicine

## 2022-09-29 ENCOUNTER — Encounter: Payer: Self-pay | Admitting: Family Medicine

## 2022-09-29 ENCOUNTER — Other Ambulatory Visit: Payer: Self-pay | Admitting: Nurse Practitioner

## 2022-09-29 ENCOUNTER — Telehealth: Payer: Self-pay

## 2022-09-29 VITALS — BP 118/77 | HR 80 | Resp 18 | Ht 70.0 in | Wt 216.0 lb

## 2022-09-29 DIAGNOSIS — E039 Hypothyroidism, unspecified: Secondary | ICD-10-CM

## 2022-09-29 DIAGNOSIS — F411 Generalized anxiety disorder: Secondary | ICD-10-CM | POA: Diagnosis not present

## 2022-09-29 DIAGNOSIS — E876 Hypokalemia: Secondary | ICD-10-CM

## 2022-09-29 DIAGNOSIS — I10 Essential (primary) hypertension: Secondary | ICD-10-CM

## 2022-09-29 MED ORDER — POTASSIUM CHLORIDE CRYS ER 20 MEQ PO TBCR
20.0000 meq | EXTENDED_RELEASE_TABLET | Freq: Two times a day (BID) | ORAL | 0 refills | Status: DC
Start: 2022-09-29 — End: 2023-03-15

## 2022-09-29 MED ORDER — LEVOTHYROXINE SODIUM 137 MCG PO TABS
137.0000 ug | ORAL_TABLET | Freq: Every morning | ORAL | 1 refills | Status: DC
Start: 2022-09-29 — End: 2023-03-15

## 2022-09-29 MED ORDER — FLUOXETINE HCL 10 MG PO TABS
10.00 mg | ORAL_TABLET | Freq: Every day | ORAL | 1 refills | Status: DC
Start: 2022-09-29 — End: 2022-11-10

## 2022-09-29 NOTE — Progress Notes (Signed)
   Established Patient Office Visit  Subjective   Patient ID: Frances Finley, female    DOB: 10-25-1955  Age: 67 y.o. MRN: [id number]  Chief Complaint  Patient presents with   Hypertension    HPI [place] Frances Finley is Frances 67 y.o. female presenting today for follow up of hypertension.  At last appointment, blood pressure 153/67.  Recommended to continue HCTZ 25 mg daily and add lisinopril 5 mg daily.  Patient has tolerated lisinopril well, denies any side effects.  Blood pressures have been within range at other doctors appointments, patient has not been able to check at home because she has misplaced her blood pressure cuff and monitor. She also endorses increased anxiety and finds that it is difficult to calm racing thoughts throughout the day.  She has been an anxious person for many years, but she has never been on any anxiety medication.  She would like to start some today  ROS Negative unless otherwise noted in HPI   Objective:     BP 118/77 (BP Location: Left Arm, Patient Position: Sitting, Cuff Size: Large)   Pulse 80   Resp 18   Ht 5\' 10"  (1.778 m)   Wt 216 lb (98 kg)   SpO2 95%   BMI 30.99 kg/m   Physical Exam Constitutional:      General: She is not in acute distress.    Appearance: Normal appearance.  HENT:     Head: Normocephalic and atraumatic.  Cardiovascular:     Rate and Rhythm: Normal rate and regular rhythm.     Heart sounds: No murmur heard.    No friction rub. No gallop.  Pulmonary:     Effort: Pulmonary effort is normal. No respiratory distress.     Breath sounds: No wheezing, rhonchi or rales.  Skin:    General: Skin is warm and dry.  Neurological:     Mental Status: She is alert and oriented to person, place, and time.     Assessment & Plan:  Essential hypertension Assessment & Plan: Stable, at goal on repeat 118/77.  Continue hydrochlorothiazide 25 mg daily, lisinopril 5 mg daily.  Will continue to monitor.   Acquired  hypothyroidism Assessment & Plan: Continue levothyroxine 137 mcg.  Will recheck thyroid function with next labs.  Orders: -     Levothyroxine Sodium; Take 1 tablet (137 mcg total) by mouth every morning.  Dispense: 90 tablet; Refill: 1 -     TSH; Future -     T4, free; Future  Hypokalemia -     Potassium Chloride Crys ER; Take 1 tablet (20 mEq total) by mouth 2 (two) times daily.  Dispense: 14 tablet; Refill: 0 -     Comprehensive metabolic panel; Future  GAD (generalized anxiety disorder) Assessment & Plan: Start Prozac 10 mg daily.  Follow-up in 6 weeks to include CMP.  Will continue to monitor.  Orders: -     FLUoxetine HCl; Take 1 tablet (10 mg total) by mouth daily.  Dispense: 90 tablet; Refill: 1 -     Comprehensive metabolic panel; Future  Potassium still slightly low on last check on 09/07/2022.  Continue with 1 more week of potassium supplement, will recheck CMP at follow-up appointment.  Return in about 6 weeks (around 11/10/2022) for follow-up for anxiety, blood work (not fasting) 1 week before.    [name], PA

## 2022-09-29 NOTE — Telephone Encounter (Signed)
Pt is wanting a call back explaining why she only received one weeks worth of K+.

## 2022-09-29 NOTE — Assessment & Plan Note (Signed)
Stable, at goal on repeat 118/77.  Continue hydrochlorothiazide 25 mg daily, lisinopril 5 mg daily.  Will continue to monitor.

## 2022-09-29 NOTE — Assessment & Plan Note (Signed)
Start Prozac 10 mg daily.  Follow-up in 6 weeks to include CMP.  Will continue to monitor.

## 2022-09-29 NOTE — Assessment & Plan Note (Signed)
Continue levothyroxine 137 mcg.  Will recheck thyroid function with next labs.

## 2022-09-30 NOTE — Telephone Encounter (Signed)
Yes, as we discussed at the appointment we are going to do 1 more weeks worth of potassium to ensure that we do not make her potassium levels too high.  We will recheck labs including potassium levels 1 week before her next appointment, at that time we can reassess if she needs to continue potassium further.  I believe that she does need to schedule a lab appointment still for a week before her next appointment if you are able to do so.

## 2022-10-01 NOTE — Addendum Note (Signed)
Addended by: Vincent Gros on: 10/01/2022 06:33 AM   Modules accepted: Level of Service

## 2022-10-07 ENCOUNTER — Other Ambulatory Visit: Payer: Self-pay | Admitting: Nurse Practitioner

## 2022-10-07 DIAGNOSIS — E785 Hyperlipidemia, unspecified: Secondary | ICD-10-CM

## 2022-10-09 ENCOUNTER — Other Ambulatory Visit: Payer: Self-pay | Admitting: Nurse Practitioner

## 2022-10-09 DIAGNOSIS — I10 Essential (primary) hypertension: Secondary | ICD-10-CM

## 2022-10-09 DIAGNOSIS — N3281 Overactive bladder: Secondary | ICD-10-CM | POA: Diagnosis not present

## 2022-10-12 ENCOUNTER — Encounter: Payer: Self-pay | Admitting: *Deleted

## 2022-10-12 NOTE — Progress Notes (Signed)
[facility] Quality Team Note  Name: Frances Finley Date of Birth: 1956/02/04 MRN: [id number] Date: 10/12/2022  [name] Quality Team has reviewed this patient's chart, please see recommendations below:  [facility] Quality Other; Pt has open gaps for colonoscopy and mammogram.  Pt declined in 2023 per ov note.  Would it be possible to see if pt changed her mind when she comes in for ov 11/10/22?

## 2022-10-15 DIAGNOSIS — G546 Phantom limb syndrome with pain: Secondary | ICD-10-CM | POA: Diagnosis not present

## 2022-10-15 DIAGNOSIS — Z79891 Long term (current) use of opiate analgesic: Secondary | ICD-10-CM | POA: Diagnosis not present

## 2022-10-15 DIAGNOSIS — Z79899 Other long term (current) drug therapy: Secondary | ICD-10-CM | POA: Diagnosis not present

## 2022-10-15 DIAGNOSIS — M79609 Pain in unspecified limb: Secondary | ICD-10-CM | POA: Diagnosis not present

## 2022-10-19 ENCOUNTER — Other Ambulatory Visit: Payer: Self-pay | Admitting: Nurse Practitioner

## 2022-10-19 DIAGNOSIS — I10 Essential (primary) hypertension: Secondary | ICD-10-CM

## 2022-10-20 ENCOUNTER — Other Ambulatory Visit: Payer: Self-pay | Admitting: Nurse Practitioner

## 2022-10-20 DIAGNOSIS — J4520 Mild intermittent asthma, uncomplicated: Secondary | ICD-10-CM

## 2022-10-27 ENCOUNTER — Ambulatory Visit: Payer: Medicare HMO | Admitting: Family Medicine

## 2022-10-27 ENCOUNTER — Other Ambulatory Visit: Payer: Self-pay | Admitting: Family Medicine

## 2022-10-27 DIAGNOSIS — G629 Polyneuropathy, unspecified: Secondary | ICD-10-CM

## 2022-11-02 NOTE — Progress Notes (Signed)
Labs reviewed during patient visit.

## 2022-11-03 ENCOUNTER — Other Ambulatory Visit: Payer: Medicare HMO

## 2022-11-09 ENCOUNTER — Other Ambulatory Visit: Payer: Self-pay | Admitting: Nurse Practitioner

## 2022-11-09 DIAGNOSIS — J4520 Mild intermittent asthma, uncomplicated: Secondary | ICD-10-CM

## 2022-11-10 ENCOUNTER — Encounter: Payer: Self-pay | Admitting: Family Medicine

## 2022-11-10 ENCOUNTER — Ambulatory Visit (INDEPENDENT_AMBULATORY_CARE_PROVIDER_SITE_OTHER): Payer: Medicare HMO | Admitting: Family Medicine

## 2022-11-10 ENCOUNTER — Ambulatory Visit: Payer: Medicare HMO | Admitting: Family Medicine

## 2022-11-10 VITALS — BP 164/86 | HR 89 | Resp 18 | Ht 70.0 in | Wt 209.0 lb

## 2022-11-10 DIAGNOSIS — Z89512 Acquired absence of left leg below knee: Secondary | ICD-10-CM

## 2022-11-10 DIAGNOSIS — F411 Generalized anxiety disorder: Secondary | ICD-10-CM

## 2022-11-10 MED ORDER — DULOXETINE HCL 20 MG PO CPEP
20.0000 mg | ORAL_CAPSULE | Freq: Every day | ORAL | 3 refills | Status: DC
Start: 2022-11-10 — End: 2023-01-12

## 2022-11-10 NOTE — Assessment & Plan Note (Signed)
Patient has not tolerated Prozac well.  We discussed that it is recommended to taper off of Prozac before switching to a new one, but she has felt so awful on it that she shared that if she is being honest, she is going to directly switch from one to the other.  We did discuss that this can be very uncomfortable and cause things like headache, dizziness, nausea.  We will switch to an SNRI to see if it is tolerated better.  Starting Cymbalta 20 mg daily to help with anxiety as well as possibly neuropathy.  If this is ineffective, then in the future we may consider starting something like Abilify or Seroquel.

## 2022-11-10 NOTE — Assessment & Plan Note (Signed)
Prosthetic in place.  Will continue to monitor.

## 2022-11-10 NOTE — Progress Notes (Signed)
Established Patient Office Visit  Subjective   Patient ID: Frances Finley, female    DOB: 11-29-1955  Age: 67 y.o. MRN: [id number]  Chief Complaint  Patient presents with   Anxiety    HPI Frances Finley is a 67 y.o. female presenting today for follow up of mood.  At last appointment, started fluoxetine 10 mg daily. Mood: Patient is here to follow up for anxiety, currently managing with fluoxetine. Taking medication with significant side effects, including worsening anxiety, irritability, racing thoughts, depressed mood, difficulty concentrating. Denies mood changes or SI/HI. She feels mood is worse since last visit.  She denies any periods of sinus, euphoria, invincibility.     11/10/2022    3:32 PM 08/17/2022    3:20 PM 04/14/2022    3:32 PM  Depression screen PHQ 2/9  Decreased Interest 3 1 1   Down, Depressed, Hopeless 2 2 1   PHQ - 2 Score 5 3 2   Altered sleeping 0 0 0  Tired, decreased energy 2 0 0  Change in appetite 2 0 0  Feeling bad or failure about yourself  1 0 0  Trouble concentrating 1 0 0  Moving slowly or fidgety/restless 1 0 0  Suicidal thoughts 0 0 0  PHQ-9 Score 12 3 2   Difficult doing work/chores Very difficult         11/10/2022    3:33 PM 04/14/2022    3:32 PM 12/10/2021    2:28 PM 10/07/2021    2:29 PM  GAD 7 : Generalized Anxiety Score  Nervous, Anxious, on Edge 3 0 0 1  Control/stop worrying 3 1 1 1   Worry too much - different things 3 1 1  0  Trouble relaxing 3 1 1  0  Restless 2 0 0 0  Easily annoyed or irritable 1 0 0 0  Afraid - awful might happen 2 1 0 0  Total GAD 7 Score 17 4 3 2   Anxiety Difficulty Very difficult   Not difficult at all   ROS Negative unless otherwise noted in HPI   Objective:     BP (!) 164/86 (BP Location: Left Arm, Patient Position: Sitting, Cuff Size: Large)   Pulse 89   Resp 18   Ht 5\' 10"  (1.778 m)   Wt 209 lb (94.8 kg)   SpO2 92%   BMI 29.99 kg/m   Physical Exam Constitutional:      General: She is  not in acute distress.    Appearance: Normal appearance.  HENT:     Head: Normocephalic and atraumatic.  Cardiovascular:     Rate and Rhythm: Normal rate and regular rhythm.     Heart sounds: No murmur heard.    No friction rub. No gallop.  Pulmonary:     Effort: Pulmonary effort is normal. No respiratory distress.     Breath sounds: No wheezing, rhonchi or rales.  Skin:    General: Skin is warm and dry.  Neurological:     Mental Status: She is alert and oriented to person, place, and time.      Assessment & Plan:  GAD (generalized anxiety disorder) Assessment & Plan: Patient has not tolerated Prozac well.  We discussed that it is recommended to taper off of Prozac before switching to a new one, but she has felt so awful on it that she shared that if she is being honest, she is going to directly switch from one to the other.  We did discuss that this can be very  uncomfortable and cause things like headache, dizziness, nausea.  We will switch to an SNRI to see if it is tolerated better.  Starting Cymbalta 20 mg daily to help with anxiety as well as possibly neuropathy.  If this is ineffective, then in the future we may consider starting something like Abilify or Seroquel.  Orders: -     DULoxetine HCl; Take 1 capsule (20 mg total) by mouth daily.  Dispense: 30 capsule; Refill: 3  History of below-knee amputation of left lower extremity (HCC) Assessment & Plan: Prosthetic in place.  Will continue to monitor.     Return in about 2 months (around 01/11/2023) for follow-up for mood, swtiched Prozac to Cymbalta.    Frances Quitter, PA

## 2022-11-10 NOTE — Patient Instructions (Addendum)
RECOMMENDED PROZAC TAPER: -Week 1: 1/2 dose Prozac -Week 2: 1/2 dose Prozac every other day -Week 3: stop Prozac  If switching cold [place] from Prozac to Cymbalta, you may experience headache, dizziness, nausea, etc.

## 2022-11-19 DIAGNOSIS — M79609 Pain in unspecified limb: Secondary | ICD-10-CM | POA: Diagnosis not present

## 2022-11-19 DIAGNOSIS — Z79899 Other long term (current) drug therapy: Secondary | ICD-10-CM | POA: Diagnosis not present

## 2022-11-19 DIAGNOSIS — Z79891 Long term (current) use of opiate analgesic: Secondary | ICD-10-CM | POA: Diagnosis not present

## 2022-11-19 DIAGNOSIS — G546 Phantom limb syndrome with pain: Secondary | ICD-10-CM | POA: Diagnosis not present

## 2022-11-19 DIAGNOSIS — Z1389 Encounter for screening for other disorder: Secondary | ICD-10-CM | POA: Diagnosis not present

## 2022-11-19 DIAGNOSIS — G894 Chronic pain syndrome: Secondary | ICD-10-CM | POA: Diagnosis not present

## 2022-11-20 ENCOUNTER — Other Ambulatory Visit: Payer: Self-pay | Admitting: Family Medicine

## 2022-11-20 DIAGNOSIS — E785 Hyperlipidemia, unspecified: Secondary | ICD-10-CM

## 2022-12-07 ENCOUNTER — Other Ambulatory Visit: Payer: Self-pay | Admitting: Family Medicine

## 2022-12-07 DIAGNOSIS — I10 Essential (primary) hypertension: Secondary | ICD-10-CM

## 2022-12-31 DIAGNOSIS — Z1389 Encounter for screening for other disorder: Secondary | ICD-10-CM | POA: Diagnosis not present

## 2022-12-31 DIAGNOSIS — Z79899 Other long term (current) drug therapy: Secondary | ICD-10-CM | POA: Diagnosis not present

## 2022-12-31 DIAGNOSIS — M79609 Pain in unspecified limb: Secondary | ICD-10-CM | POA: Diagnosis not present

## 2022-12-31 DIAGNOSIS — G546 Phantom limb syndrome with pain: Secondary | ICD-10-CM | POA: Diagnosis not present

## 2022-12-31 DIAGNOSIS — Z79891 Long term (current) use of opiate analgesic: Secondary | ICD-10-CM | POA: Diagnosis not present

## 2022-12-31 DIAGNOSIS — G894 Chronic pain syndrome: Secondary | ICD-10-CM | POA: Diagnosis not present

## 2023-01-12 ENCOUNTER — Ambulatory Visit (INDEPENDENT_AMBULATORY_CARE_PROVIDER_SITE_OTHER): Payer: Medicare HMO | Admitting: Family Medicine

## 2023-01-12 ENCOUNTER — Encounter: Payer: Self-pay | Admitting: Family Medicine

## 2023-01-12 VITALS — BP 123/80 | HR 91 | Resp 18 | Ht 70.0 in | Wt 200.0 lb

## 2023-01-12 DIAGNOSIS — Z6828 Body mass index (BMI) 28.0-28.9, adult: Secondary | ICD-10-CM | POA: Diagnosis not present

## 2023-01-12 DIAGNOSIS — E663 Overweight: Secondary | ICD-10-CM | POA: Diagnosis not present

## 2023-01-12 DIAGNOSIS — Z1159 Encounter for screening for other viral diseases: Secondary | ICD-10-CM | POA: Diagnosis not present

## 2023-01-12 DIAGNOSIS — Z1211 Encounter for screening for malignant neoplasm of colon: Secondary | ICD-10-CM | POA: Diagnosis not present

## 2023-01-12 DIAGNOSIS — F411 Generalized anxiety disorder: Secondary | ICD-10-CM | POA: Diagnosis not present

## 2023-01-12 DIAGNOSIS — R7303 Prediabetes: Secondary | ICD-10-CM | POA: Diagnosis not present

## 2023-01-12 DIAGNOSIS — I10 Essential (primary) hypertension: Secondary | ICD-10-CM | POA: Diagnosis not present

## 2023-01-12 DIAGNOSIS — E039 Hypothyroidism, unspecified: Secondary | ICD-10-CM | POA: Diagnosis not present

## 2023-01-12 DIAGNOSIS — Z1212 Encounter for screening for malignant neoplasm of rectum: Secondary | ICD-10-CM

## 2023-01-12 DIAGNOSIS — Z23 Encounter for immunization: Secondary | ICD-10-CM

## 2023-01-12 MED ORDER — DULOXETINE HCL 20 MG PO CPEP: 40.0000 mg | ORAL_CAPSULE | Freq: Every day | ORAL | 1 refills | Status: DC

## 2023-01-12 NOTE — Progress Notes (Signed)
Established Patient Office Visit  Subjective   Patient ID: Frances Finley, female    DOB: June 26, 1955  Age: 67 y.o. MRN: [id number]  Chief Complaint  Patient presents with   Anxiety   Depression    HPI Frances Finley is a 66 y.o. female presenting today for follow up of anxiety.  She is also interested in doing Cologuard for colorectal cancer screening. Mood: Patient is here to follow up for depression and anxiety, currently managing with Cymbalta 20 mg daily.  She reports that the Cymbalta has been much better than Prozac and she is not having any side effects with it.  Denies mood changes or SI/HI. She feels mood is improved since last visit but still has some room for improvement.     01/12/2023    2:10 PM 11/10/2022    3:32 PM 08/17/2022    3:20 PM  Depression screen PHQ 2/9  Decreased Interest 1 3 1   Down, Depressed, Hopeless 1 2 2   PHQ - 2 Score 2 5 3   Altered sleeping 0 0 0  Tired, decreased energy 1 2 0  Change in appetite 0 2 0  Feeling bad or failure about yourself  1 1 0  Trouble concentrating 0 1 0  Moving slowly or fidgety/restless 0 1 0  Suicidal thoughts 0 0 0  PHQ-9 Score 4 12 3   Difficult doing work/chores Somewhat difficult Very difficult        01/12/2023    2:10 PM 11/10/2022    3:33 PM 04/14/2022    3:32 PM 12/10/2021    2:28 PM  GAD 7 : Generalized Anxiety Score  Nervous, Anxious, on Edge 2 3 0 0  Control/stop worrying 2 3 1 1   Worry too much - different things 2 3 1 1   Trouble relaxing 1 3 1 1   Restless 1 2 0 0  Easily annoyed or irritable 1 1 0 0  Afraid - awful might happen 1 2 1  0  Total GAD 7 Score 10 17 4 3   Anxiety Difficulty Somewhat difficult Very difficult      ROS Negative unless otherwise noted in HPI   Objective:     BP 123/80 (BP Location: Left Arm, Patient Position: Sitting, Cuff Size: Large)   Pulse 91   Resp 18   Ht 5\' 10"  (1.778 m)   Wt 200 lb (90.7 kg)   SpO2 94%   BMI 28.70 kg/m   Physical  Exam Constitutional:      General: She is not in acute distress.    Appearance: Normal appearance.  HENT:     Head: Normocephalic and atraumatic.  Cardiovascular:     Rate and Rhythm: Normal rate and regular rhythm.     Heart sounds: No murmur heard.    No friction rub. No gallop.  Pulmonary:     Effort: Pulmonary effort is normal. No respiratory distress.     Breath sounds: No wheezing, rhonchi or rales.  Musculoskeletal:     Cervical back: Normal range of motion.  Skin:    General: Skin is warm and dry.  Neurological:     General: No focal deficit present.     Mental Status: She is alert and oriented to person, place, and time. Mental status is at baseline.  Psychiatric:        Mood and Affect: Mood normal.        Thought Content: Thought content normal.        Judgment: Judgment normal.  Assessment & Plan:  GAD (generalized anxiety disorder) Assessment & Plan: GAD-7 score of 10, much improved from 17.  Tolerating Cymbalta well.  Increase to Cymbalta 40 mg daily.  Follow-up in 2 months, can continue at 40 mg daily or increase to 60 mg daily if needed at that point.  Orders: -     DULoxetine HCl; Take 2 capsules (40 mg total) by mouth daily.  Dispense: 180 capsule; Refill: 1  Need for influenza vaccination -     Flu Vaccine Trivalent High Dose (Fluad)  Screening for colorectal cancer -     Cologuard  Essential hypertension  Acquired hypothyroidism -     TSH Rfx on Abnormal to Free T4; Future  Prediabetes -     Hemoglobin A1c; Future  Overweight with body mass index (BMI) of 28 to 28.9 in adult -     CBC with Differential/Platelet; Future -     Comprehensive metabolic panel; Future -     Hemoglobin A1c; Future -     Lipid panel; Future -     TSH Rfx on Abnormal to Free T4; Future -     VITAMIN D 25 Hydroxy (Vit-D Deficiency, Fractures); Future  Screening for viral disease -     Hepatitis C antibody; Future -     HIV Antibody (routine testing w rflx);  Future  Mammogram Patient declines mammogram.  Discussed recommendations for breast cancer screening, patient states that the implant in her breast always causes indeterminate findings and she would rather not do screenings altogether.  She does keep up with self breast exams.  Encouraged her to continue to do so, but also consider doing breast cancer screening as it is the best way to find any structural changes early.  Bone marrow density Declines DEXA scan at this time. Discussed importance of optimizing vitamin D and calcium supplementation.  Return in about 2 months (around 03/14/2023) for follow-up for HTN, thyroid, mood, prediabetes, fasting blood work 1 week before.    [name], PA

## 2023-01-12 NOTE — Patient Instructions (Signed)
AT THE PHARMACY: You can update the recommended vaccinations. -Shingles (2 doses, then done) -Pneumonia PCV20 (one and done!)

## 2023-01-12 NOTE — Assessment & Plan Note (Signed)
GAD-7 score of 10, much improved from 17.  Tolerating Cymbalta well.  Increase to Cymbalta 40 mg daily.  Follow-up in 2 months, can continue at 40 mg daily or increase to 60 mg daily if needed at that point.

## 2023-01-18 ENCOUNTER — Encounter: Payer: Medicare HMO | Admitting: Family Medicine

## 2023-01-19 ENCOUNTER — Telehealth: Payer: Self-pay

## 2023-01-19 NOTE — Telephone Encounter (Signed)
Unsuccessful attempt to reach patient on preferred number listed in notes for scheduled AWV. Left message on voicemail okay to reschedule. 

## 2023-01-28 DIAGNOSIS — Z79891 Long term (current) use of opiate analgesic: Secondary | ICD-10-CM | POA: Diagnosis not present

## 2023-01-28 DIAGNOSIS — Z79899 Other long term (current) drug therapy: Secondary | ICD-10-CM | POA: Diagnosis not present

## 2023-01-28 DIAGNOSIS — G546 Phantom limb syndrome with pain: Secondary | ICD-10-CM | POA: Diagnosis not present

## 2023-01-28 DIAGNOSIS — G894 Chronic pain syndrome: Secondary | ICD-10-CM | POA: Diagnosis not present

## 2023-01-28 DIAGNOSIS — M79609 Pain in unspecified limb: Secondary | ICD-10-CM | POA: Diagnosis not present

## 2023-02-08 ENCOUNTER — Other Ambulatory Visit: Payer: Self-pay | Admitting: Family Medicine

## 2023-02-08 DIAGNOSIS — E785 Hyperlipidemia, unspecified: Secondary | ICD-10-CM

## 2023-02-08 DIAGNOSIS — I10 Essential (primary) hypertension: Secondary | ICD-10-CM

## 2023-02-16 ENCOUNTER — Other Ambulatory Visit: Payer: Self-pay | Admitting: Family Medicine

## 2023-02-16 DIAGNOSIS — I10 Essential (primary) hypertension: Secondary | ICD-10-CM

## 2023-02-25 ENCOUNTER — Other Ambulatory Visit: Payer: Self-pay | Admitting: Family Medicine

## 2023-02-25 DIAGNOSIS — J4520 Mild intermittent asthma, uncomplicated: Secondary | ICD-10-CM

## 2023-02-25 DIAGNOSIS — Z79891 Long term (current) use of opiate analgesic: Secondary | ICD-10-CM | POA: Diagnosis not present

## 2023-02-25 DIAGNOSIS — M79609 Pain in unspecified limb: Secondary | ICD-10-CM | POA: Diagnosis not present

## 2023-02-25 DIAGNOSIS — G894 Chronic pain syndrome: Secondary | ICD-10-CM | POA: Diagnosis not present

## 2023-02-25 DIAGNOSIS — Z79899 Other long term (current) drug therapy: Secondary | ICD-10-CM | POA: Diagnosis not present

## 2023-02-25 DIAGNOSIS — G546 Phantom limb syndrome with pain: Secondary | ICD-10-CM | POA: Diagnosis not present

## 2023-02-25 DIAGNOSIS — G629 Polyneuropathy, unspecified: Secondary | ICD-10-CM

## 2023-03-08 ENCOUNTER — Other Ambulatory Visit: Payer: Medicare HMO

## 2023-03-08 DIAGNOSIS — E039 Hypothyroidism, unspecified: Secondary | ICD-10-CM

## 2023-03-08 DIAGNOSIS — R7303 Prediabetes: Secondary | ICD-10-CM

## 2023-03-08 DIAGNOSIS — E663 Overweight: Secondary | ICD-10-CM

## 2023-03-08 DIAGNOSIS — Z1159 Encounter for screening for other viral diseases: Secondary | ICD-10-CM

## 2023-03-11 LAB — HIV-1/HIV-2 QUALITATIVE RNA
Final Interpretation: NEGATIVE
HIV-1 RNA, Qualitative: NONREACTIVE
HIV-2 RNA, Qualitative: NONREACTIVE

## 2023-03-11 LAB — HIV 1/2 AB DIFFERENTIATION
HIV 1 Ab: NONREACTIVE
HIV 2 Ab: NONREACTIVE
NOTE (HIV CONF MULTIP: NEGATIVE

## 2023-03-11 LAB — CBC WITH DIFFERENTIAL/PLATELET
Basophils Absolute: 0.1 10*3/uL (ref 0.0–0.2)
Basos: 1 %
EOS (ABSOLUTE): 0.3 10*3/uL (ref 0.0–0.4)
Eos: 4 %
Hematocrit: 49.9 % — ABNORMAL HIGH (ref 34.0–46.6)
Hemoglobin: 16.5 g/dL — ABNORMAL HIGH (ref 11.1–15.9)
Immature Grans (Abs): 0 10*3/uL (ref 0.0–0.1)
Immature Granulocytes: 1 %
Lymphocytes Absolute: 1.8 10*3/uL (ref 0.7–3.1)
Lymphs: 29 %
MCH: 30.8 pg (ref 26.6–33.0)
MCHC: 33.1 g/dL (ref 31.5–35.7)
MCV: 93 fL (ref 79–97)
Monocytes Absolute: 0.5 10*3/uL (ref 0.1–0.9)
Monocytes: 9 %
Neutrophils Absolute: 3.4 10*3/uL (ref 1.4–7.0)
Neutrophils: 56 %
Platelets: 274 10*3/uL (ref 150–450)
RBC: 5.36 x10E6/uL — ABNORMAL HIGH (ref 3.77–5.28)
RDW: 12.4 % (ref 11.7–15.4)
WBC: 6.1 10*3/uL (ref 3.4–10.8)

## 2023-03-11 LAB — HEMOGLOBIN A1C
Est. average glucose Bld gHb Est-mCnc: 117 mg/dL
Hgb A1c MFr Bld: 5.7 % — ABNORMAL HIGH (ref 4.8–5.6)

## 2023-03-11 LAB — COMPREHENSIVE METABOLIC PANEL
ALT: 19 [IU]/L (ref 0–32)
AST: 17 IU/L (ref 0–40)
Albumin: 4.2 g/dL (ref 3.9–4.9)
Alkaline Phosphatase: 66 [IU]/L (ref 44–121)
BUN/Creatinine Ratio: 23 (ref 12–28)
BUN: 14 mg/dL (ref 8–27)
Bilirubin Total: 0.7 mg/dL (ref 0.0–1.2)
CO2: 30 mmol/L — ABNORMAL HIGH (ref 20–29)
Calcium: 9.7 mg/dL (ref 8.7–10.3)
Chloride: 92 mmol/L — ABNORMAL LOW (ref 96–106)
Creatinine, Ser: 0.62 mg/dL (ref 0.57–1.00)
Globulin, Total: 2.8 g/dL (ref 1.5–4.5)
Glucose: 121 mg/dL — ABNORMAL HIGH (ref 70–99)
Potassium: 4.2 mmol/L (ref 3.5–5.2)
Sodium: 137 mmol/L (ref 134–144)
Total Protein: 7 g/dL (ref 6.0–8.5)
eGFR: 99 mL/min/{1.73_m2} (ref >59)

## 2023-03-11 LAB — T4, FREE: Free T4: 1.71 ng/dL (ref 0.82–1.77)

## 2023-03-11 LAB — LIPID PANEL
Chol/HDL Ratio: 1.9 ratio (ref 0.0–4.4)
Cholesterol, Total: 199 mg/dL (ref 100–199)
HDL: 105 mg/dL (ref >39)
LDL Chol Calc (NIH): 81 mg/dL (ref 0–99)
Triglycerides: 75 mg/dL (ref 0–149)
VLDL Cholesterol Cal: 13 mg/dL (ref 5–40)

## 2023-03-11 LAB — HEPATITIS C ANTIBODY: Hep C Virus Ab: NONREACTIVE

## 2023-03-11 LAB — HIV ANTIBODY (ROUTINE TESTING W REFLEX): HIV Screen 4th Generation wRfx: REACTIVE

## 2023-03-11 LAB — T4F

## 2023-03-11 LAB — TSH RFX ON ABNORMAL TO FREE T4: TSH: 0.078 u[IU]/mL — ABNORMAL LOW (ref 0.450–4.500)

## 2023-03-11 LAB — VITAMIN D 25 HYDROXY (VIT D DEFICIENCY, FRACTURES): Vit D, 25-Hydroxy: 40.9 ng/mL (ref 30.0–100.0)

## 2023-03-11 LAB — TSH

## 2023-03-15 ENCOUNTER — Ambulatory Visit (INDEPENDENT_AMBULATORY_CARE_PROVIDER_SITE_OTHER): Payer: Medicare HMO | Admitting: Family Medicine

## 2023-03-15 ENCOUNTER — Encounter: Payer: Self-pay | Admitting: Family Medicine

## 2023-03-15 VITALS — BP 139/85 | HR 74 | Resp 18 | Ht 70.0 in | Wt 201.0 lb

## 2023-03-15 DIAGNOSIS — F411 Generalized anxiety disorder: Secondary | ICD-10-CM

## 2023-03-15 DIAGNOSIS — I10 Essential (primary) hypertension: Secondary | ICD-10-CM

## 2023-03-15 DIAGNOSIS — E785 Hyperlipidemia, unspecified: Secondary | ICD-10-CM

## 2023-03-15 DIAGNOSIS — H9313 Tinnitus, bilateral: Secondary | ICD-10-CM | POA: Diagnosis not present

## 2023-03-15 DIAGNOSIS — H9319 Tinnitus, unspecified ear: Secondary | ICD-10-CM | POA: Insufficient documentation

## 2023-03-15 DIAGNOSIS — E039 Hypothyroidism, unspecified: Secondary | ICD-10-CM | POA: Diagnosis not present

## 2023-03-15 DIAGNOSIS — R7303 Prediabetes: Secondary | ICD-10-CM

## 2023-03-15 DIAGNOSIS — D582 Other hemoglobinopathies: Secondary | ICD-10-CM | POA: Diagnosis not present

## 2023-03-15 MED ORDER — SIMVASTATIN 20 MG PO TABS: 20.0000 mg | ORAL_TABLET | Freq: Every evening | ORAL | 1 refills | Status: DC

## 2023-03-15 MED ORDER — LISINOPRIL 5 MG PO TABS: 5.0000 mg | ORAL_TABLET | Freq: Every day | ORAL | 1 refills | Status: DC

## 2023-03-15 MED ORDER — HYDROCHLOROTHIAZIDE 25 MG PO TABS: 25.0000 mg | ORAL_TABLET | Freq: Every morning | ORAL | 0 refills | Status: DC

## 2023-03-15 MED ORDER — DULOXETINE HCL 60 MG PO CPEP: 60.0000 mg | ORAL_CAPSULE | Freq: Every day | ORAL | 3 refills | Status: DC

## 2023-03-15 MED ORDER — LEVOTHYROXINE SODIUM 137 MCG PO TABS: 137.0000 ug | ORAL_TABLET | Freq: Every morning | ORAL | 1 refills | Status: DC

## 2023-03-15 NOTE — Assessment & Plan Note (Addendum)
Stable, at goal.  Continue hydrochlorothiazide 25 mg daily, lisinopril 5 mg daily.  Given history of hypokalemia, may consider decreasing hydrochlorothiazide and increasing lisinopril in the future.  Most recent potassium was within normal limits.  For now, will continue to monitor.

## 2023-03-15 NOTE — Assessment & Plan Note (Signed)
TSH slightly low but normal T4.Continue levothyroxine 137 mcg.  Will recheck thyroid function with next labs as well.

## 2023-03-15 NOTE — Progress Notes (Signed)
Established Patient Office Visit  Subjective   Patient ID: Frances Finley, female    DOB: 11-03-1955  Age: 67 y.o. MRN: [id number]  Chief Complaint  Patient presents with   Prediabetes   Anxiety   Depression   Hypertension   Hypothyroidism    HPI [place] A Roulette is a 67 y.o. female presenting today for follow up of hypertension, prediabetes, hypothyroidism, mood.  She also complains of frequent humming or ringing in her ears. Hypertension:   Pt denies chest pain, SOB, dizziness, edema, syncope, fatigue or heart palpitations. Taking lisinopril and hydrochlorothiazide, reports excellent compliance with treatment. Denies side effects.  She does have a history of hypokalemia in the past. Prediabetes: denies hypoglycemic events, wounds or sores that are not healing well, increased thirst or urination.  Hypothyroidism: Taking levothyroxine 137 mcg regularly in the AM away from food and vitamins. Denies fatigue, weight changes, heat/cold intolerance, skin/hair changes, bowel changes, CVS symptoms. Mood: Patient is here to follow up for depression and anxiety, currently managing with Cymbalta 40 mg daily which was increased at last visit. Taking medication without side effects, reports excellent compliance with treatment. Denies mood changes or SI/HI. She feels mood is improved since last visit, but there is still some room for improvement.  She is interested in increasing her dose of Cymbalta.     03/15/2023    2:54 PM 01/12/2023    2:10 PM 11/10/2022    3:32 PM  Depression screen PHQ 2/9  Decreased Interest 1 1 3   Down, Depressed, Hopeless 0 1 2  PHQ - 2 Score 1 2 5   Altered sleeping 0 0 0  Tired, decreased energy 0 1 2  Change in appetite 0 0 2  Feeling bad or failure about yourself  0 1 1  Trouble concentrating 0 0 1  Moving slowly or fidgety/restless 0 0 1  Suicidal thoughts 0 0 0  PHQ-9 Score 1 4 12   Difficult doing work/chores Somewhat difficult Somewhat difficult Very  difficult       03/15/2023    2:54 PM 01/12/2023    2:10 PM 11/10/2022    3:33 PM 04/14/2022    3:32 PM  GAD 7 : Generalized Anxiety Score  Nervous, Anxious, on Edge 1 2 3  0  Control/stop worrying 1 2 3 1   Worry too much - different things 1 2 3 1   Trouble relaxing 1 1 3 1   Restless 0 1 2 0  Easily annoyed or irritable 0 1 1 0  Afraid - awful might happen 0 1 2 1   Total GAD 7 Score 4 10 17 4   Anxiety Difficulty Somewhat difficult Somewhat difficult Very difficult     Outpatient Medications Prior to Visit  Medication Sig   albuterol (VENTOLIN HFA) 108 (90 Base) MCG/ACT inhaler INHALE 2 PUFFS BY MOUTH EVERY 6 HOURS AS NEEDED FOR WHEEZING.   esomeprazole (NEXIUM) 40 MG capsule Take 1 capsule (40 mg total) by mouth daily.   fluticasone-salmeterol (ADVAIR) 250-50 MCG/ACT AEPB INHALE 1 PUFF INTO THE LUNGS IN THE MORNING AND AT BEDTIME.   gabapentin (NEURONTIN) 600 MG tablet TAKE 1 TABLET BY MOUTH 4 TIMES A DAY   montelukast (SINGULAIR) 10 MG tablet TAKE 1 TABLET (10 MG TOTAL) BY MOUTH AT BEDTIME.   oxyCODONE (OXY IR/ROXICODONE) 5 MG immediate release tablet Take 1-2 tablets (5-10 mg total) by mouth every 4 (four) hours as needed.   Vibegron (GEMTESA) 75 MG TABS Take 1 tablet by mouth daily.   [DISCONTINUED]  DULoxetine (CYMBALTA) 20 MG capsule Take 2 capsules (40 mg total) by mouth daily.   [DISCONTINUED] hydrochlorothiazide (HYDRODIURIL) 25 MG tablet TAKE 1 TABLET BY MOUTH EVERY MORNING   [DISCONTINUED] levothyroxine (SYNTHROID) 137 MCG tablet Take 1 tablet (137 mcg total) by mouth every morning.   [DISCONTINUED] lisinopril (ZESTRIL) 5 MG tablet TAKE 1 TABLET BY MOUTH DAILY.   [DISCONTINUED] simvastatin (ZOCOR) 20 MG tablet TAKE 1 TABLET BY MOUTH EVERY EVENING.   [DISCONTINUED] potassium chloride SA (KLOR-CON M) 20 MEQ tablet Take 1 tablet (20 mEq total) by mouth 2 (two) times daily. (Patient not taking: Reported on 03/15/2023)   No facility-administered medications prior to visit.     ROS Negative unless otherwise noted in HPI   Objective:     BP 139/85 Comment: home BP  Pulse 74   Resp 18   Ht 5\' 10"  (1.778 m)   Wt 201 lb (91.2 kg)   SpO2 96%   BMI 28.84 kg/m   Physical Exam Constitutional:      General: She is not in acute distress.    Appearance: Normal appearance.  HENT:     Head: Normocephalic and atraumatic.     Right Ear: Tympanic membrane, ear canal and external ear normal. There is no impacted cerumen.     Left Ear: Tympanic membrane, ear canal and external ear normal. There is no impacted cerumen.  Cardiovascular:     Rate and Rhythm: Normal rate and regular rhythm.     Heart sounds: No murmur heard.    No friction rub. No gallop.  Pulmonary:     Effort: Pulmonary effort is normal. No respiratory distress.     Breath sounds: No wheezing, rhonchi or rales.  Skin:    General: Skin is warm and dry.  Neurological:     Mental Status: She is alert and oriented to person, place, and time.     Assessment & Plan:  Essential hypertension Assessment & Plan: Stable, at goal.  Continue hydrochlorothiazide 25 mg daily, lisinopril 5 mg daily.  Given history of hypokalemia, may consider decreasing hydrochlorothiazide and increasing lisinopril in the future.  Most recent potassium was within normal limits.  For now, will continue to monitor.  Orders: -     Lisinopril; Take 1 tablet (5 mg total) by mouth daily.  Dispense: 90 tablet; Refill: 1 -     hydroCHLOROthiazide; Take 1 tablet (25 mg total) by mouth every morning.  Dispense: 90 tablet; Refill: 0  Prediabetes Assessment & Plan: A1c 5.7.  Will continue to monitor.   Acquired hypothyroidism Assessment & Plan: TSH slightly low but normal T4.Continue levothyroxine 137 mcg.  Will recheck thyroid function with next labs as well.  Orders: -     Levothyroxine Sodium; Take 1 tablet (137 mcg total) by mouth every morning.  Dispense: 90 tablet; Refill: 1  GAD (generalized anxiety  disorder) Assessment & Plan: Continue Cymbalta 40 mg daily.  Will continue to monitor.  Orders: -     DULoxetine HCl; Take 1 capsule (60 mg total) by mouth daily.  Dispense: 90 capsule; Refill: 3  Elevated hemoglobin (HCC) -     CBC with Differential/Platelet; Future  Hyperlipidemia, unspecified hyperlipidemia type -     Simvastatin; Take 1 tablet (20 mg total) by mouth every evening.  Dispense: 90 tablet; Refill: 1  Tinnitus of both ears Assessment & Plan: Does not bother patient enough for referral to ENT.  Will continue to monitor.   Hemoglobin 16.5 and hematocrit 49.9 have increased  from 6 months ago.  Repeat CBC with peripheral smear in 1-2 weeks.  Depending on results, discussed possible referral to hematology.  Patient verbalized understanding and is agreeable to this plan.  Return in about 2 weeks (around 03/29/2023) for nonfasting blood work; 4 months for HTN, hypothyroid, prediabetes, mood.    [name], PA

## 2023-03-15 NOTE — Patient Instructions (Signed)
If you do not like the higher dose of the Cymbalta, just let me know and I can send a lower dose in again.

## 2023-03-15 NOTE — Assessment & Plan Note (Signed)
Does not bother patient enough for referral to ENT.  Will continue to monitor.

## 2023-03-15 NOTE — Assessment & Plan Note (Signed)
Continue Cymbalta 40 mg daily.  Will continue to monitor.

## 2023-03-15 NOTE — Assessment & Plan Note (Signed)
A1c 5.7.  Will continue to monitor.

## 2023-03-25 DIAGNOSIS — M79609 Pain in unspecified limb: Secondary | ICD-10-CM | POA: Diagnosis not present

## 2023-03-25 DIAGNOSIS — Z79899 Other long term (current) drug therapy: Secondary | ICD-10-CM | POA: Diagnosis not present

## 2023-03-25 DIAGNOSIS — G894 Chronic pain syndrome: Secondary | ICD-10-CM | POA: Diagnosis not present

## 2023-03-25 DIAGNOSIS — Z79891 Long term (current) use of opiate analgesic: Secondary | ICD-10-CM | POA: Diagnosis not present

## 2023-03-25 DIAGNOSIS — G546 Phantom limb syndrome with pain: Secondary | ICD-10-CM | POA: Diagnosis not present

## 2023-03-26 ENCOUNTER — Other Ambulatory Visit: Payer: Medicare HMO

## 2023-03-26 DIAGNOSIS — D582 Other hemoglobinopathies: Secondary | ICD-10-CM

## 2023-03-27 LAB — CBC WITH DIFFERENTIAL/PLATELET
Basophils Absolute: 0.1 10*3/uL (ref 0.0–0.2)
Basos: 1 %
EOS (ABSOLUTE): 0.4 10*3/uL (ref 0.0–0.4)
Eos: 7 %
Hematocrit: 44.6 % (ref 34.0–46.6)
Hemoglobin: 15 g/dL (ref 11.1–15.9)
Immature Grans (Abs): 0 10*3/uL (ref 0.0–0.1)
Immature Granulocytes: 0 %
Lymphocytes Absolute: 1.8 10*3/uL (ref 0.7–3.1)
Lymphs: 32 %
MCH: 31.3 pg (ref 26.6–33.0)
MCHC: 33.6 g/dL (ref 31.5–35.7)
MCV: 93 fL (ref 79–97)
Monocytes Absolute: 0.6 10*3/uL (ref 0.1–0.9)
Monocytes: 10 %
Neutrophils Absolute: 2.8 10*3/uL (ref 1.4–7.0)
Neutrophils: 50 %
Platelets: 234 10*3/uL (ref 150–450)
RBC: 4.79 x10E6/uL (ref 3.77–5.28)
RDW: 12.7 % (ref 11.7–15.4)
WBC: 5.6 10*3/uL (ref 3.4–10.8)

## 2023-04-15 ENCOUNTER — Telehealth: Payer: Self-pay

## 2023-04-15 DIAGNOSIS — Z79899 Other long term (current) drug therapy: Secondary | ICD-10-CM | POA: Diagnosis not present

## 2023-04-15 DIAGNOSIS — G546 Phantom limb syndrome with pain: Secondary | ICD-10-CM | POA: Diagnosis not present

## 2023-04-15 DIAGNOSIS — M79609 Pain in unspecified limb: Secondary | ICD-10-CM | POA: Diagnosis not present

## 2023-04-15 DIAGNOSIS — Z79891 Long term (current) use of opiate analgesic: Secondary | ICD-10-CM | POA: Diagnosis not present

## 2023-04-15 NOTE — Telephone Encounter (Signed)
Copied from CRM [id number]. Topic: Clinical - Lab/Test Results >> Apr 15, 2023 12:50 PM Almira Coaster wrote: Reason for CRM: Patient called to get information about a test that she did, I provided patient with the Mychart message regarding her CBC; however, patient states she had another test after that and hasn't gotten any results.

## 2023-04-15 NOTE — Telephone Encounter (Signed)
Copied from CRM [id number]. Topic: Clinical - Lab/Test Results >> Apr 15, 2023 12:50 PM Frances Finley wrote: Reason for CRM: Patient called to get information about a test that she did, I provided patient with the Mychart message regarding her CBC; however, patient states she had another test after that and hasn't gotten any results.

## 2023-04-15 NOTE — Telephone Encounter (Signed)
Patient states that she is looking for results for blood smear. Per Jairo Ben, PA-C, smear is only required if CBC was still abnormal. Due to CBC being normal, no smear was necessary. Left VM requesting a return call.

## 2023-05-03 NOTE — Telephone Encounter (Signed)
Copied from CRM [id number]. Topic: Clinical - Medical Advice >> May 03, 2023 10:04 AM Elle L wrote: Reason for CRM: The patient is requesting to see if anything can be called in while she waits for her appointment. She has a cough and has lost her sense of taste and smell. Her call back number is [phone] and her preferred pharmacy is [facility] - [place], [place] - [address].

## 2023-05-03 NOTE — Telephone Encounter (Signed)
She can take acetaminophen for any aches and pains.  Without evaluating her, I am not sure if cough medicine is appropriate or not since a productive cough is not recommended to use a cough suppressant.

## 2023-05-03 NOTE — Telephone Encounter (Signed)
Called patient she is advised of the recommendation pt has an appt for tomorrow 05/04/2023

## 2023-05-04 ENCOUNTER — Ambulatory Visit (INDEPENDENT_AMBULATORY_CARE_PROVIDER_SITE_OTHER): Payer: Medicare HMO | Admitting: Family Medicine

## 2023-05-04 ENCOUNTER — Other Ambulatory Visit: Payer: Self-pay | Admitting: Family Medicine

## 2023-05-04 ENCOUNTER — Encounter: Payer: Self-pay | Admitting: Family Medicine

## 2023-05-04 VITALS — BP 136/79 | HR 73 | Ht 70.0 in | Wt 194.0 lb

## 2023-05-04 DIAGNOSIS — J4520 Mild intermittent asthma, uncomplicated: Secondary | ICD-10-CM

## 2023-05-04 DIAGNOSIS — J45901 Unspecified asthma with (acute) exacerbation: Secondary | ICD-10-CM | POA: Insufficient documentation

## 2023-05-04 MED ORDER — PREDNISONE 20 MG PO TABS: 40.0000 mg | ORAL_TABLET | Freq: Every day | ORAL | 0 refills | Status: AC

## 2023-05-04 MED ORDER — ALBUTEROL SULFATE HFA 108 (90 BASE) MCG/ACT IN AERS: INHALATION_SPRAY | RESPIRATORY_TRACT | 3 refills | Status: DC

## 2023-05-04 MED ORDER — AZITHROMYCIN 500 MG PO TABS: 500.0000 mg | ORAL_TABLET | Freq: Every day | ORAL | 0 refills | Status: DC

## 2023-05-04 MED ORDER — AMOXICILLIN-POT CLAVULANATE 875-125 MG PO TABS: 1.0000 | ORAL_TABLET | Freq: Two times a day (BID) | ORAL | 0 refills | Status: AC

## 2023-05-04 NOTE — Assessment & Plan Note (Signed)
 Based on history and exam patient seems to have a mild asthma exacerbation triggered by respiratory infection.  Cannot say for sure if respiratory infection is bacterial, but will err on the side of caution and prescribe antibiotics in addition to prednisone  for asthma exacerbation.  Will send in refill for her albuterol .

## 2023-05-04 NOTE — Progress Notes (Signed)
   Acute Office Visit  Subjective:     Patient ID: Frances Finley, female    DOB: 1956-03-05, 67 y.o.   MRN: [id number]  Chief Complaint  Patient presents with   Cough    HPI Patient is in today for 3 to 4 weeks of cough and infectious symptoms.  Patient states she has had 3 to 4 weeks of cough.  Then on December 23 (1 week ago) she noticed a loss of taste and smell.  She is just now starting to get some taste back.  She has not complained of fever or chills but did wake up last night sweating.  Other than sinus and chest congestion she has not had any other symptoms.  Was taking DayQuil and NyQuil but stopped this.  Has a history of asthma and uses a daily maintenance with albuterol  rescue inhaler.  Has noticed increased shortness of breath with ambulation.    ROS      Objective:    BP 136/79   Pulse 73   Ht 5' 10 (1.778 m)   Wt 194 lb (88 kg)   SpO2 91%   BMI 27.84 kg/m    Physical Exam General: Alert, oriented CV: Regular rate and rhythm Pulmonary: Rhonchorous bilaterally.  Coughing.  End expiratory wheezing most notable on the left side.  No results found for any visits on 05/04/23.      Assessment & Plan:   Moderate asthma with exacerbation, unspecified whether persistent Assessment & Plan: Based on history and exam patient seems to have a mild asthma exacerbation triggered by respiratory infection.  Cannot say for sure if respiratory infection is bacterial, but will err on the side of caution and prescribe antibiotics in addition to prednisone  for asthma exacerbation.  Will send in refill for her albuterol .   Mild intermittent asthma without complication -     Albuterol  Sulfate HFA; INHALE 2 PUFFS BY MOUTH EVERY 6 HOURS AS NEEDED FOR WHEEZING.  Dispense: 17 g; Refill: 3  Other orders -     Amoxicillin -Pot Clavulanate; Take 1 tablet by mouth 2 (two) times daily for 5 days.  Dispense: 10 tablet; Refill: 0 -     Azithromycin ; Take 1 tablet (500 mg total)  by mouth daily.  Dispense: 5 tablet; Refill: 0 -     predniSONE ; Take 2 tablets (40 mg total) by mouth daily with breakfast for 5 days.  Dispense: 10 tablet; Refill: 0     Return if symptoms worsen or fail to improve.  [name], MD

## 2023-05-04 NOTE — Patient Instructions (Signed)
 It was nice to see you today,  We addressed the following topics today: -I believe your respiratory infection is causing a mild asthma exacerbation.  Therefore I have sent in 5 days of steroids to go along with 5 days of your antibiotics - I have also sent in refills of your albuterol  inhaler. - If you do not feel better after you finish your antibiotics and steroids please let us  know.  Have a great day,  Rolan Slain, MD

## 2023-05-12 ENCOUNTER — Other Ambulatory Visit: Payer: Self-pay | Admitting: Family Medicine

## 2023-05-12 DIAGNOSIS — J4520 Mild intermittent asthma, uncomplicated: Secondary | ICD-10-CM

## 2023-06-10 ENCOUNTER — Encounter: Payer: Self-pay | Admitting: Family Medicine

## 2023-06-14 ENCOUNTER — Other Ambulatory Visit: Payer: Self-pay | Admitting: Family Medicine

## 2023-06-14 DIAGNOSIS — G629 Polyneuropathy, unspecified: Secondary | ICD-10-CM

## 2023-06-17 ENCOUNTER — Other Ambulatory Visit: Payer: Self-pay | Admitting: Family Medicine

## 2023-06-17 DIAGNOSIS — G894 Chronic pain syndrome: Secondary | ICD-10-CM | POA: Diagnosis not present

## 2023-06-17 DIAGNOSIS — J4520 Mild intermittent asthma, uncomplicated: Secondary | ICD-10-CM

## 2023-06-17 DIAGNOSIS — Z79891 Long term (current) use of opiate analgesic: Secondary | ICD-10-CM | POA: Diagnosis not present

## 2023-06-17 DIAGNOSIS — M79609 Pain in unspecified limb: Secondary | ICD-10-CM | POA: Diagnosis not present

## 2023-06-17 DIAGNOSIS — G546 Phantom limb syndrome with pain: Secondary | ICD-10-CM | POA: Diagnosis not present

## 2023-07-07 ENCOUNTER — Other Ambulatory Visit: Payer: Self-pay | Admitting: Family Medicine

## 2023-07-07 DIAGNOSIS — I10 Essential (primary) hypertension: Secondary | ICD-10-CM

## 2023-07-09 ENCOUNTER — Ambulatory Visit: Payer: Medicare HMO | Admitting: Family Medicine

## 2023-07-13 ENCOUNTER — Ambulatory Visit: Payer: Medicare HMO | Admitting: Family Medicine

## 2023-07-15 DIAGNOSIS — M79609 Pain in unspecified limb: Secondary | ICD-10-CM | POA: Diagnosis not present

## 2023-07-15 DIAGNOSIS — G546 Phantom limb syndrome with pain: Secondary | ICD-10-CM | POA: Diagnosis not present

## 2023-07-15 DIAGNOSIS — Z79891 Long term (current) use of opiate analgesic: Secondary | ICD-10-CM | POA: Diagnosis not present

## 2023-07-25 NOTE — Progress Notes (Unsigned)
   Established Patient Office Visit  Subjective   Patient ID: Frances Finley, female    DOB: 1956-02-23  Age: 68 y.o. MRN: [id number]  No chief complaint on file.   HPI  TSH-137 mcg.  TSH has been low.  HLD-simvastatin 20 mg  HTN HCTZ and lisinopril  Polycythemia-repeat CBC    The ASCVD Risk score (Arnett DK, et al., 2019) failed to calculate for the following reasons:   The valid HDL cholesterol range is 20 to 100 mg/dL  Health Maintenance Due  Topic Date Due   Zoster Vaccines- Shingrix (1 of 2) 04/25/1976   Fecal DNA (Cologuard)  Never done   DEXA SCAN  Never done   Medicare Annual Wellness (AWV)  12/11/2022   COVID-19 Vaccine (6 - 2024-25 season) 01/03/2023   Pneumonia Vaccine 74+ Years old (3 of 3 - PPSV23 or PCV20) 07/12/2023      Objective:     There were no vitals taken for this visit. {Vitals History (Optional):23777}  Physical Exam   No results found for any visits on 07/26/23.      Assessment & Plan:   There are no diagnoses linked to this encounter.   No follow-ups on file.    Sandre Kitty, MD

## 2023-07-26 ENCOUNTER — Encounter: Payer: Self-pay | Admitting: Family Medicine

## 2023-07-26 ENCOUNTER — Ambulatory Visit (INDEPENDENT_AMBULATORY_CARE_PROVIDER_SITE_OTHER): Admitting: Family Medicine

## 2023-07-26 VITALS — BP 135/69 | HR 82 | Ht 70.0 in | Wt 202.0 lb

## 2023-07-26 DIAGNOSIS — I10 Essential (primary) hypertension: Secondary | ICD-10-CM | POA: Diagnosis not present

## 2023-07-26 DIAGNOSIS — D751 Secondary polycythemia: Secondary | ICD-10-CM | POA: Diagnosis not present

## 2023-07-26 DIAGNOSIS — E039 Hypothyroidism, unspecified: Secondary | ICD-10-CM

## 2023-07-26 NOTE — Assessment & Plan Note (Signed)
 Most recent TSH was low and T4 was normal.  Her PCP did not adjust her medication at that time.  Patient endorses jitteriness.  Will recheck TSH to confirm that it is still low and then adjust her Synthroid according to TSH values, not T4.

## 2023-07-26 NOTE — Assessment & Plan Note (Signed)
 Will recheck potassium level.  Has not been taking potassium in the last 3 months.  If low will restart.

## 2023-07-26 NOTE — Patient Instructions (Addendum)
 It was nice to see you today,  We addressed the following topics today: -I am going to check your TSH level, your hemoglobin levels, and potassium levels. - once i get these results I will let you know what we need to do next. - I will see you back in 3 months.  Have a great day,  Frederic Jericho, MD

## 2023-07-26 NOTE — Assessment & Plan Note (Signed)
 Consistently mildly elevated hemoglobins.  Not a tobacco smoker but does vape.  Does not endorse any sleep apnea symptoms.  Will get JAK2 and then likely refer to hematology.

## 2023-07-29 DIAGNOSIS — Z79891 Long term (current) use of opiate analgesic: Secondary | ICD-10-CM | POA: Diagnosis not present

## 2023-07-29 DIAGNOSIS — M79609 Pain in unspecified limb: Secondary | ICD-10-CM | POA: Diagnosis not present

## 2023-07-29 DIAGNOSIS — G546 Phantom limb syndrome with pain: Secondary | ICD-10-CM | POA: Diagnosis not present

## 2023-08-02 ENCOUNTER — Other Ambulatory Visit

## 2023-08-02 DIAGNOSIS — E039 Hypothyroidism, unspecified: Secondary | ICD-10-CM | POA: Diagnosis not present

## 2023-08-02 DIAGNOSIS — D751 Secondary polycythemia: Secondary | ICD-10-CM

## 2023-08-02 DIAGNOSIS — I10 Essential (primary) hypertension: Secondary | ICD-10-CM | POA: Diagnosis not present

## 2023-08-05 LAB — BASIC METABOLIC PANEL WITH GFR
BUN/Creatinine Ratio: 21 (ref 12–28)
BUN: 11 mg/dL (ref 8–27)
CO2: 32 mmol/L — ABNORMAL HIGH (ref 20–29)
Calcium: 9.8 mg/dL (ref 8.7–10.3)
Chloride: 96 mmol/L (ref 96–106)
Creatinine, Ser: 0.53 mg/dL — ABNORMAL LOW (ref 0.57–1.00)
Glucose: 106 mg/dL — ABNORMAL HIGH (ref 70–99)
Potassium: 4.2 mmol/L (ref 3.5–5.2)
Sodium: 141 mmol/L (ref 134–144)
eGFR: 102 mL/min/{1.73_m2} (ref >59)

## 2023-08-05 LAB — JAK2 GENOTYPR

## 2023-08-05 LAB — TSH: TSH: 0.524 u[IU]/mL (ref 0.450–4.500)

## 2023-08-11 ENCOUNTER — Other Ambulatory Visit: Payer: Self-pay | Admitting: Family Medicine

## 2023-08-11 DIAGNOSIS — R5383 Other fatigue: Secondary | ICD-10-CM

## 2023-08-11 DIAGNOSIS — D751 Secondary polycythemia: Secondary | ICD-10-CM

## 2023-08-26 DIAGNOSIS — Z79891 Long term (current) use of opiate analgesic: Secondary | ICD-10-CM | POA: Diagnosis not present

## 2023-08-26 DIAGNOSIS — M79609 Pain in unspecified limb: Secondary | ICD-10-CM | POA: Diagnosis not present

## 2023-08-26 DIAGNOSIS — G546 Phantom limb syndrome with pain: Secondary | ICD-10-CM | POA: Diagnosis not present

## 2023-09-07 ENCOUNTER — Other Ambulatory Visit: Payer: Self-pay | Admitting: Family Medicine

## 2023-09-07 DIAGNOSIS — E039 Hypothyroidism, unspecified: Secondary | ICD-10-CM

## 2023-09-23 DIAGNOSIS — Z79891 Long term (current) use of opiate analgesic: Secondary | ICD-10-CM | POA: Diagnosis not present

## 2023-10-06 ENCOUNTER — Other Ambulatory Visit: Payer: Self-pay | Admitting: Family Medicine

## 2023-10-06 DIAGNOSIS — E785 Hyperlipidemia, unspecified: Secondary | ICD-10-CM

## 2023-10-20 DIAGNOSIS — G894 Chronic pain syndrome: Secondary | ICD-10-CM | POA: Diagnosis not present

## 2023-10-20 DIAGNOSIS — Z79891 Long term (current) use of opiate analgesic: Secondary | ICD-10-CM | POA: Diagnosis not present

## 2023-10-20 DIAGNOSIS — M79609 Pain in unspecified limb: Secondary | ICD-10-CM | POA: Diagnosis not present

## 2023-11-01 ENCOUNTER — Ambulatory Visit (INDEPENDENT_AMBULATORY_CARE_PROVIDER_SITE_OTHER): Admitting: Family Medicine

## 2023-11-01 ENCOUNTER — Encounter: Payer: Self-pay | Admitting: Family Medicine

## 2023-11-01 VITALS — BP 129/72 | HR 89 | Ht 70.0 in | Wt 205.4 lb

## 2023-11-01 DIAGNOSIS — F419 Anxiety disorder, unspecified: Secondary | ICD-10-CM

## 2023-11-01 DIAGNOSIS — G894 Chronic pain syndrome: Secondary | ICD-10-CM

## 2023-11-01 DIAGNOSIS — F32A Depression, unspecified: Secondary | ICD-10-CM | POA: Diagnosis not present

## 2023-11-01 DIAGNOSIS — I10 Essential (primary) hypertension: Secondary | ICD-10-CM

## 2023-11-01 MED ORDER — BREXPIPRAZOLE 1 MG PO TABS: ORAL_TABLET | ORAL | 0 refills | Status: DC

## 2023-11-01 NOTE — Assessment & Plan Note (Signed)
 managed by pain clinic with oxycodone , gabapentin . ([facility] in [place]). Sharp shooting pain episodes consistent with nerve damage from surgical complications. - Continue current pain management through [facility] - TENS unit discussed for neuropathic pain management, pain clinic supposed to provide - Offered to consider taking over oxycodone  prescribing at next visit if desired

## 2023-11-01 NOTE — Assessment & Plan Note (Signed)
 currently on duloxetine , likely at maximum dose for current indication. Discussed adding adjunctive medication for incomplete response to first-line treatment. - Start Rexulti as adjunctive treatment for depression, may require prior authorization through Medicare - Continue current duloxetine  dosing - If Rexulti denied, will try Abilify as alternative

## 2023-11-01 NOTE — Progress Notes (Signed)
 Established Patient Office Visit  Subjective   Patient ID: Frances  Frances Finley, female    DOB: [birth year]  Age: 68 y.o. 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 - Pain management through pain clinic in [place] ([facility]) - Recent changes in pain clinic providers, new doctor expected next month - Previous two appointments were video chats due to no available doctor - Denies current morphine  use, previously trialed but discontinued due to sedation - Takes oxycodone  prescribed by pain clinic - Also experiences arthritis pain in fingers - Sometimes has low back pain in addition to leg pain - Reports improved breathing and stair climbing since quitting cigarettes - Uses vaping - Has elevated bed for sleep   Medications: duloxetine  (Cymbalta ) daily in morning (mood), gabapentin  four times daily (pain), oxycodone  from pain clinic, lisinopril , hydrochlorothiazide , thyroid  medication  PMH, PSH, FH, Social Hx: leg amputation with subsequent infection requiring debridement, bilateral rotator cuff issues previously managed with physical therapy at [facility] (12 weeks), former cigarette smoker now using vape, sleep on elevated bed  ROS: reports fatigue, depression, frustration, sharp shooting leg pain, improved breathing since smoking cessation, denies morphine  use   The ASCVD Risk score (Arnett DK, et al., 2019) failed to calculate for the following reasons:   The  valid HDL cholesterol range is 20 to 100 mg/dL  Health Maintenance Due  Topic Date Due   Zoster Vaccines- Shingrix (1 of 2) 04/25/1976   Fecal DNA (Cologuard)  Never done   DEXA SCAN  Never done   Medicare Annual Wellness (AWV)  12/11/2022   COVID-19 Vaccine (6 - 2024-25 season) 01/03/2023   Pneumococcal Vaccine: 50+ Years (3 of 3 - PCV20 or PCV21) 07/12/2023      Objective:     BP 129/72   Pulse 89   Ht 5' 10 (1.778 m)   Wt 205 lb 6.4 oz (93.2 kg)   SpO2 97%   BMI 29.47 kg/m    Physical Exam Gen: alert, oriented Pulm: no resp distress Msk: L bka w/ prosthesis.    No results found for any visits on 11/01/23.      Assessment & Plan:   Anxiety and depression Assessment & Plan:  currently on duloxetine , likely at maximum dose for current indication. Discussed adding adjunctive medication for incomplete response to first-line treatment. - Start Rexulti as adjunctive treatment for depression, may require prior authorization through Medicare - Continue current duloxetine  dosing - If Rexulti denied, will try Abilify as alternative   Chronic pain syndrome Assessment & Plan: managed by pain clinic with oxycodone , gabapentin . ([facility] [place]). Sharp shooting pain episodes consistent with nerve damage from surgical complications. - Continue current pain management through [facility] - TENS unit discussed for neuropathic pain management, pain clinic supposed to provide - Offered to consider taking over oxycodone  prescribing at next visit if desired    Essential hypertension Assessment & Plan: - Continue current medications - initial bp elevated, normal on repeat - Encouraged to locate home monitors for monitoring  Other orders -     Brexpiprazole; Take 1 tablet (1 mg total) by mouth daily for 7 days, THEN 2 tablets (2 mg total) daily.  Dispense: 67 tablet; Refill: 0     Return in about 6 weeks (around 12/13/2023) for mdd, htn.     Frances MARLA Slain, MD

## 2023-11-01 NOTE — Assessment & Plan Note (Signed)
-   Continue current medications - initial bp elevated, normal on repeat - Encouraged to locate home monitors for monitoring

## 2023-11-01 NOTE — Patient Instructions (Signed)
 It was nice to see you today,  We addressed the following topics today: -I have started a medication for you called Rexulti.  Take 1 mg for 7 days and then increase to 2 mg a day afterwards until I see you again. - It may take a few weeks for the prior authorization to be approved.  If it is not approved I will let you know what other medication I am sending in instead. - Try using a TENS unit on the leg that is experiencing sharp shooting pains.  You can get these over-the-counter without a prescription if you are unable to get it from your pain management provider.  Have a great day,  Rolan Slain, MD

## 2023-11-02 ENCOUNTER — Telehealth: Payer: Self-pay

## 2023-11-02 NOTE — Telephone Encounter (Signed)
 Copied from CRM [id number]. Topic: Clinical - Prescription Issue >> Nov 02, 2023 11:36 AM Travis F wrote: Reason for CRM: Patient is calling in because she is requesting a different medication other than Cymbalta  because the copay is $400 and she cannot afford that.

## 2023-11-03 NOTE — Telephone Encounter (Signed)
 Can you call the patient to clarify if she meant rexulti instead of cymbalta ? Rexulti is the new medication we prescribed and she has been on cymbalta  for a while so the price shouldn't be any different.

## 2023-11-03 NOTE — Telephone Encounter (Signed)
 Called patient she said Rexulti

## 2023-11-04 MED ORDER — QUETIAPINE FUMARATE 25 MG PO TABS: 25.0000 mg | ORAL_TABLET | Freq: Every day | ORAL | 1 refills | Status: DC

## 2023-11-04 NOTE — Telephone Encounter (Signed)
 I will send in seroquel for her to take.  She should take this in the evening before bed.

## 2023-11-04 NOTE — Addendum Note (Signed)
 Addended by: CHANDRA TORIBIO POUR on: 11/04/2023 04:48 PM   Modules accepted: Orders

## 2023-11-04 NOTE — Telephone Encounter (Signed)
 Pt informed of below.

## 2023-11-08 ENCOUNTER — Other Ambulatory Visit: Payer: Self-pay | Admitting: Family Medicine

## 2023-11-08 DIAGNOSIS — J4520 Mild intermittent asthma, uncomplicated: Secondary | ICD-10-CM

## 2023-11-08 DIAGNOSIS — I10 Essential (primary) hypertension: Secondary | ICD-10-CM

## 2023-11-17 DIAGNOSIS — M79609 Pain in unspecified limb: Secondary | ICD-10-CM | POA: Diagnosis not present

## 2023-11-17 DIAGNOSIS — G546 Phantom limb syndrome with pain: Secondary | ICD-10-CM | POA: Diagnosis not present

## 2023-11-17 DIAGNOSIS — G894 Chronic pain syndrome: Secondary | ICD-10-CM | POA: Diagnosis not present

## 2023-11-17 DIAGNOSIS — Z79891 Long term (current) use of opiate analgesic: Secondary | ICD-10-CM | POA: Diagnosis not present

## 2023-11-22 ENCOUNTER — Telehealth: Payer: Self-pay

## 2023-11-22 NOTE — Telephone Encounter (Signed)
 Copied from CRM [id number]. Topic: Clinical - Prescription Issue >> Nov 19, 2023  6:05 PM Winona R wrote: Pt having a hard time getting Medication: oxyCODONE  (OXY IR/ROXICODONE ) 5 MG immediate release tablet from her pain management DR as they were suppose to send it Wednesday. Ive informed her unfortunately the office is closed. Theres nothing that can be done until Monday if possible. She has also mentioned shell go to ER if needed.

## 2023-12-08 DIAGNOSIS — M79605 Pain in left leg: Secondary | ICD-10-CM | POA: Diagnosis not present

## 2023-12-08 DIAGNOSIS — Z79891 Long term (current) use of opiate analgesic: Secondary | ICD-10-CM | POA: Diagnosis not present

## 2023-12-13 ENCOUNTER — Other Ambulatory Visit: Payer: Self-pay | Admitting: Family Medicine

## 2023-12-13 DIAGNOSIS — G629 Polyneuropathy, unspecified: Secondary | ICD-10-CM

## 2023-12-27 ENCOUNTER — Ambulatory Visit (INDEPENDENT_AMBULATORY_CARE_PROVIDER_SITE_OTHER): Admitting: Family Medicine

## 2023-12-27 ENCOUNTER — Encounter: Payer: Self-pay | Admitting: Family Medicine

## 2023-12-27 VITALS — BP 138/78 | HR 93 | Ht 70.0 in | Wt 213.8 lb

## 2023-12-27 DIAGNOSIS — F419 Anxiety disorder, unspecified: Secondary | ICD-10-CM | POA: Diagnosis not present

## 2023-12-27 DIAGNOSIS — F32A Depression, unspecified: Secondary | ICD-10-CM | POA: Diagnosis not present

## 2023-12-27 DIAGNOSIS — G894 Chronic pain syndrome: Secondary | ICD-10-CM | POA: Diagnosis not present

## 2023-12-27 DIAGNOSIS — J45901 Unspecified asthma with (acute) exacerbation: Secondary | ICD-10-CM

## 2023-12-27 DIAGNOSIS — J4520 Mild intermittent asthma, uncomplicated: Secondary | ICD-10-CM | POA: Diagnosis not present

## 2023-12-27 DIAGNOSIS — N3281 Overactive bladder: Secondary | ICD-10-CM

## 2023-12-27 MED ORDER — ALBUTEROL SULFATE HFA 108 (90 BASE) MCG/ACT IN AERS: INHALATION_SPRAY | RESPIRATORY_TRACT | 3 refills | Status: AC

## 2023-12-27 MED ORDER — QUETIAPINE FUMARATE 25 MG PO TABS: 25.0000 mg | ORAL_TABLET | Freq: Every day | ORAL | 1 refills | Status: AC

## 2023-12-27 MED ORDER — SOLIFENACIN SUCCINATE 10 MG PO TABS: 5.0000 mg | ORAL_TABLET | Freq: Every day | ORAL | 2 refills | Status: DC

## 2023-12-27 NOTE — Progress Notes (Unsigned)
 Established Patient Office Visit  Subjective   Patient ID: Frances Finley, female    DOB: [birth year]  Age: 68 y.o. MRN: [id number]  Chief Complaint  Patient presents with   Medical Management of Chronic Issues    HPI  Subjective - Reports Quetiapine  for sleepiness is working well.  - Discussed high cost of Gemtesa  ($200) for overactive bladder. Requesting a cheaper alternative and transfer of care from urology for this issue. Reports husband is retiring, leading to tighter finances. Denies being ready for surgery, the next step offered by urology.  - Inquires about albuterol  inhaler prescription. Reports receiving only two rescue inhalers for a three-month period, while receiving three of the maintenance inhaler. Requests this be corrected to a three-month supply.  - Requests transfer of care for chronic pain management from [facility] in [place]. Reports dissatisfaction with the clinic, including medication not being called in on time, leading to periods without medication. Also notes issues with communication and rotating providers.  Medications Quetiapine  (Seroquel ), dose not specified, for sleepiness. Gemtesa  (vibegron ), dose not specified, for overactive bladder. Albuterol  inhaler, for rescue. Oxycodone  15mg , up to 120 per month, for chronic pain, prescribed by pain management. Gabapentin , dose not specified. Has a Narcan prescription at home.  PMH, PSH, FH, Social Hx PMHx: Overactive bladder, managed by urology [facility]). Chronic pain, managed by [facility]. History of trial of morphine  tablets for chronic pain, which was not effective. PSH: None mentioned. FH: None mentioned. Social Hx: Husband is retiring in October, causing financial strain.  ROS GU: Reports overactive bladder. MSK: Reports chronic pain. RESP: Uses albuterol  inhaler. PSYCH: Takes Quetiapine  for sleepiness.       The ASCVD Risk score (Arnett DK, et al., 2019) failed to  calculate for the following reasons:   The valid HDL cholesterol range is 20 to 100 mg/dL  Health Maintenance Due  Topic Date Due   Zoster Vaccines- Shingrix (1 of 2) 04/25/1976   Fecal DNA (Cologuard)  Never done   DEXA SCAN  Never done   Medicare Annual Wellness (AWV)  12/11/2022   COVID-19 Vaccine (6 - 2024-25 season) 01/03/2023   Pneumococcal Vaccine: 50+ Years (3 of 3 - PCV20 or PCV21) 07/12/2023   INFLUENZA VACCINE  12/03/2023      Objective:     BP 138/78   Pulse 93   Ht 5' 10 (1.778 m)   Wt 213 lb 12.8 oz (97 kg)   SpO2 100%   BMI 30.68 kg/m    Physical Exam Gen: alert, oriented Pulm: no resp distress Psych: pleasant affect Msk: left bka.    No results found for any visits on 12/27/23.      Assessment & Plan:   OAB (overactive bladder) Assessment & Plan: - Patient has a history of overactive bladder, currently managed by urology with Gemtesa . Reports the medication is too expensive at $200 per month. Wishes to transfer care for this issue to our clinic to avoid traveling to urology. - Discontinue Gemtesa . - Start Vesicare  (solifenacin ). Provided paper prescription to allow for price comparison between pharmacies. Patient counseled to use a retail pharmacy with [facility] if the price is high at their usual pharmacy. - Instructed to stop Gemtesa  and start Vesicare , as they are taken similarly. - Will request records from urology clinic [facility]). - Follow up in 6-8 weeks to assess efficacy of Vesicare .   Mild intermittent asthma without complication -     Albuterol  Sulfate HFA; INHALE 2 PUFFS BY  MOUTH EVERY 6 HOURS AS NEEDED FOR WHEEZING.  Dispense: 3 each; Refill: 3  Moderate asthma with exacerbation, unspecified whether persistent Assessment & Plan: - Patient reports receiving an insufficient quantity of albuterol  rescue inhalers with each 54-month renewal. - Resent prescription for albuterol  inhaler for a 36-month supply to [facility]. -  Instructed to call the pharmacy for refills, and our office if no refills are on file.   Chronic pain syndrome Assessment & Plan: - Patient is managed for chronic pain with oxycodone  15mg  by [facility] Reports significant frustration with the clinic, including missed prescription refills leading to withdrawal, and poor communication. Requests to transfer chronic pain management to this practice. Has been on this dose for approximately 10 years. Reports pain clinic only manages medications and does not perform procedures like injections. Has a Narcan prescription at home. - Will take over prescribing of oxycodone . - Will request records from [facility]. - Next prescription for oxycodone  will be provided by this office. Patient understands they may need to request refills monthly, but can be seen every 3 months. - Counseled to request refills when they have 5 or fewer pills remaining. - Will provide a new prescription for Narcan as the current one is nearing expiration. Instructed to dispose of the old one upon receiving the new one. - Patient will cancel their upcoming appointment at the pain clinic.   Anxiety and depression Assessment & Plan: Rexulti  was unaffordable.  Pt doing well on seroquel  at bedtime.  Continue current mgmt.  Sending in new 90 day rx.    Other orders -     Solifenacin  Succinate; Take 0.5 tablets (5 mg total) by mouth daily.  Dispense: 30 tablet; Refill: 2 -     QUEtiapine  Fumarate; Take 1 tablet (25 mg total) by mouth at bedtime.  Dispense: 90 tablet; Refill: 1     Return in about 2 months (around 02/26/2024) for oab, pain.    [name], MD

## 2023-12-27 NOTE — Patient Instructions (Signed)
 It was nice to see you today,  We addressed the following topics today: -I have sent in a prescription for Vesicare .  Take this instead of your Gemtesa  for overactive bladder. - If the price is too high at [place] take the prescription to another pharmacy and ask them what the [employer]. - I can refill your next oxycodone  prescription in about 3 weeks.  I am going to request records from your pain management center - I have sent in your inhaler for 3 inhalers to [place]  Have a great day,  Dan Allah Reason, MD

## 2023-12-28 DIAGNOSIS — N3281 Overactive bladder: Secondary | ICD-10-CM | POA: Insufficient documentation

## 2023-12-28 NOTE — Assessment & Plan Note (Signed)
-   Patient is managed for chronic pain with oxycodone  15mg  by [facility] Reports significant frustration with the clinic, including missed prescription refills leading to withdrawal, and poor communication. Requests to transfer chronic pain management to this practice. Has been on this dose for approximately 10 years. Reports pain clinic only manages medications and does not perform procedures like injections. Has a Narcan prescription at home. - Will take over prescribing of oxycodone . - Will request records from [facility]. - Next prescription for oxycodone  will be provided by this office. Patient understands they may need to request refills monthly, but can be seen every 3 months. - Counseled to request refills when they have 5 or fewer pills remaining. - Will provide a new prescription for Narcan as the current one is nearing expiration. Instructed to dispose of the old one upon receiving the new one. - Patient will cancel their upcoming appointment at the pain clinic.

## 2023-12-28 NOTE — Assessment & Plan Note (Signed)
-   Patient reports receiving an insufficient quantity of albuterol  rescue inhalers with each 72-month renewal. - Resent prescription for albuterol  inhaler for a 40-month supply to [facility]. - Instructed to call the pharmacy for refills, and our office if no refills are on file.

## 2023-12-28 NOTE — Assessment & Plan Note (Signed)
 Rexulti  was unaffordable.  Pt doing well on seroquel  at bedtime.  Continue current mgmt.  Sending in new 90 day rx.

## 2023-12-28 NOTE — Assessment & Plan Note (Signed)
-   Patient has a history of overactive bladder, currently managed by urology with Gemtesa . Reports the medication is too expensive at $200 per month. Wishes to transfer care for this issue to our clinic to avoid traveling to urology. - Discontinue Gemtesa . - Start Vesicare  (solifenacin ). Provided paper prescription to allow for price comparison between pharmacies. Patient counseled to use a retail pharmacy with [facility] if the price is high at their usual pharmacy. - Instructed to stop Gemtesa  and start Vesicare , as they are taken similarly. - Will request records from urology clinic [facility]). - Follow up in 6-8 weeks to assess efficacy of Vesicare .

## 2023-12-29 ENCOUNTER — Other Ambulatory Visit: Payer: Self-pay | Admitting: Family Medicine

## 2023-12-29 DIAGNOSIS — I10 Essential (primary) hypertension: Secondary | ICD-10-CM

## 2024-01-19 ENCOUNTER — Other Ambulatory Visit: Payer: Self-pay | Admitting: Family Medicine

## 2024-01-19 ENCOUNTER — Telehealth: Payer: Self-pay

## 2024-01-19 MED ORDER — OXYCODONE HCL 5 MG PO TABS: 5.0000 mg | ORAL_TABLET | ORAL | 0 refills | Status: DC | PRN

## 2024-01-19 NOTE — Telephone Encounter (Signed)
 Copied from CRM #[id number]. Topic: Clinical - Medication Refill >> Jan 19, 2024 10:48 AM Tiffini S wrote: Medication:  oxyCODONE  (OXY IR/ROXICODONE ) 15 MG immediate release tablet count: 120  Has the patient contacted their pharmacy? Yes, said the previous doctor name is still on prescription with the pharmacy (Agent: If no, request that the patient contact the pharmacy for the refill. If patient does not wish to contact the pharmacy document the reason why and proceed with request.) (Agent: If yes, when and what did the pharmacy advise?)  This is the patient's preferred pharmacy:  [facility] - [place], [place] - [address] [place] [address] Phone: [phone] Fax: [phone]  Is this the correct pharmacy for this prescription? Yes If no, delete pharmacy and type the correct one.   Has the prescription been filled recently? Yes  Is the patient out of the medication? Yes  Has the patient been seen for an appointment in the last year OR does the patient have an upcoming appointment? Yes  Can we respond through MyChart? No, please call at [phone]  Agent: Please be advised that Rx refills may take up to 3 business days. We ask that you follow-up with your pharmacy.

## 2024-01-19 NOTE — Telephone Encounter (Signed)
 Copied from CRM [id number]. Topic: Clinical - Prescription Issue >> Jan 19, 2024 10:41 AM Everette C wrote: Reason for CRM: The patient would like to speak with a member of clinical staff when possible to confirm the dosage for their prescription of oxyCODONE  (OXY IR/ROXICODONE ) 5 MG immediate release tablet which they share was previously increased to 15 MG by their pain management specialist, please contact when available  PCP already responded

## 2024-01-20 ENCOUNTER — Telehealth (HOSPITAL_BASED_OUTPATIENT_CLINIC_OR_DEPARTMENT_OTHER): Payer: Self-pay | Admitting: *Deleted

## 2024-01-20 ENCOUNTER — Other Ambulatory Visit: Payer: Self-pay | Admitting: Family Medicine

## 2024-01-20 MED ORDER — OXYCODONE HCL 15 MG PO TABS: 15.0000 mg | ORAL_TABLET | Freq: Four times a day (QID) | ORAL | 0 refills | Status: DC | PRN

## 2024-01-20 NOTE — Telephone Encounter (Signed)
 Pt was asking about her Oxycodone  refill, informed her that it was due to fill tomorrow and when I informed her what it was written for she said that is not what they talked about that she ususally takes 15 mg 1 tablet 4 x a day quantity was 120, she said that if she knew that it was going to change she would have stayed at the pain clinic.  She said that she has already cancelled at the pain clinic and wanted to see why it was changed.

## 2024-01-20 NOTE — Telephone Encounter (Signed)
 I'll correct it so it's 15mg .  Can you call the pharmacy to tell them to cancel the 5mg  dose.

## 2024-01-21 NOTE — Telephone Encounter (Signed)
 Called PD they are advised of the 5mg  to be cancel

## 2024-02-15 ENCOUNTER — Other Ambulatory Visit: Payer: Self-pay | Admitting: Family Medicine

## 2024-02-15 NOTE — Telephone Encounter (Unsigned)
 Copied from CRM [id number]. Topic: Clinical - Medication Refill >> Feb 15, 2024 10:53 AM Emylou G wrote: Medication: oxyCODONE  (ROXICODONE ) 15 MG immediate release tablet  Has the patient contacted their pharmacy? Yes (Agent: If no, request that the patient contact the pharmacy for the refill. If patient does not wish to contact the pharmacy document the reason why and proceed with request.) (Agent: If yes, when and what did the pharmacy advise?) said to call us    This is the patient's preferred pharmacy:  [facility] - [place], [place] - [address] [place] [address] Phone: [phone] Fax: [phone]  Is this the correct pharmacy for this prescription? Yes If no, delete pharmacy and type the correct one.   Has the prescription been filled recently? No  Is the patient out of the medication? Yes - almost  Has the patient been seen for an appointment in the last year OR does the patient have an upcoming appointment? Yes  Can we respond through MyChart? No  Agent: Please be advised that Rx refills may take up to 3 business days. We ask that you follow-up with your pharmacy.

## 2024-02-15 NOTE — Telephone Encounter (Signed)
 Pt is not a pt at [facility].

## 2024-02-18 ENCOUNTER — Other Ambulatory Visit: Payer: Self-pay | Admitting: Family Medicine

## 2024-02-18 ENCOUNTER — Telehealth: Payer: Self-pay | Admitting: Family Medicine

## 2024-02-18 MED ORDER — OXYCODONE HCL 15 MG PO TABS: 15.0000 mg | ORAL_TABLET | Freq: Four times a day (QID) | ORAL | 0 refills | Status: DC | PRN

## 2024-02-18 NOTE — Telephone Encounter (Signed)
 Copied from CRM #[id number]. Topic: Clinical - Prescription Issue >> Feb 18, 2024 11:51 AM Tiffini S wrote: Reason for CRM: Patient called about the prescription  oxyCODONE  (ROXICODONE ) 15 MG immediate release tablet- take the last tablet today and will be out this weekend  Ask for a call back to follow up on medication refill requested on 02/15/24 at [phone] Advise refill is pending

## 2024-02-18 NOTE — Telephone Encounter (Signed)
 The prescription was sent to [facility] and never rerouted to us .  I have sent it in today.

## 2024-02-28 ENCOUNTER — Ambulatory Visit (INDEPENDENT_AMBULATORY_CARE_PROVIDER_SITE_OTHER): Admitting: Family Medicine

## 2024-02-28 ENCOUNTER — Encounter: Payer: Self-pay | Admitting: Family Medicine

## 2024-02-28 VITALS — BP 103/71 | HR 96 | Ht 70.0 in | Wt 220.4 lb

## 2024-02-28 DIAGNOSIS — N3281 Overactive bladder: Secondary | ICD-10-CM | POA: Diagnosis not present

## 2024-02-28 DIAGNOSIS — I10 Essential (primary) hypertension: Secondary | ICD-10-CM

## 2024-02-28 DIAGNOSIS — Z23 Encounter for immunization: Secondary | ICD-10-CM | POA: Diagnosis not present

## 2024-02-28 DIAGNOSIS — G894 Chronic pain syndrome: Secondary | ICD-10-CM

## 2024-02-28 MED ORDER — SOLIFENACIN SUCCINATE 10 MG PO TABS: 10.0000 mg | ORAL_TABLET | Freq: Every day | ORAL | 1 refills | Status: AC

## 2024-02-28 NOTE — Progress Notes (Signed)
   Established Patient Office Visit  Subjective   Patient ID: Frances  Frances Finley, female    DOB: Feb 10, 1956  Age: 68 y.o. MRN: [id number]  Chief Complaint  Patient presents with   Medical Management of Chronic Issues    HPI  Subjective - Follow up on medications. Reports Vesicare  is working pretty good for overactive bladder but is not quite fully effective. Taking half a tablet. Denies new issues or concerns. - Reports no issues with Seroquel . - Mentions a delay with the last oxycodone  refill due to it being routed to the wrong pharmacy ([facility]) but this is now resolved. Expresses some anxiety around refill timing, related to previous experiences. - Plans to get a flu shot today.  Medications Vesicare  (solifenacin ) currently taking half a tablet for overactive bladder, Seroquel  as prescribed, hydrochlorothiazide  as prescribed, lisinopril  10 mg for hypertension, oxycodone  as needed for pain.  PMH, PSH, FH, Social Hx PMHx: Overactive bladder, hypertension, chronic pain, unspecified psychiatric condition.  ROS Gen: Denies new issues. GU: Reports improvement in overactive bladder symptoms with Vesicare , but not complete resolution.    The ASCVD Risk score (Arnett DK, et al., 2019) failed to calculate for the following reasons:   The valid HDL cholesterol range is 20 to 100 mg/dL  Health Maintenance Due  Topic Date Due   Zoster Vaccines- Shingrix (1 of 2) 04/25/1976   Mammogram  Never done   Fecal DNA (Cologuard)  Never done   DEXA SCAN  Never done   Medicare Annual Wellness (AWV)  12/11/2022   Pneumococcal Vaccine: 50+ Years (3 of 3 - PCV20 or PCV21) 07/12/2023   COVID-19 Vaccine (6 - 2025-26 season) 01/03/2024      Objective:     BP 103/71   Pulse 96   Ht 5' 10 (1.778 m)   Wt 220 lb 6.4 oz (100 kg)   SpO2 94%   BMI 31.62 kg/m    Physical Exam Gen: alert, oriented Pulm: no respiratory distress Psych: pleasant affect   No results found for any visits  on 02/28/24.      Assessment & Plan:   OAB (overactive bladder) Assessment & Plan: - Reports partial improvement with half-tablet of Vesicare . Symptoms are improved but not fully controlled. - Increase Vesicare  to one full tablet daily. New prescription will be sent to [facility].   Essential hypertension Assessment & Plan: - Controlled on hydrochlorothiazide  and lisinopril  10mg . BP was slightly elevated on initial check today, which may be situational. Recheck was normal.  - continue current tx.      Chronic pain syndrome Assessment & Plan: - Patient is stable on current oxycodone  regimen. Last refill had a pharmacy routing issue which has since been corrected. - Continue current oxycodone  prescription. Will add refills to the prescription in the future to avoid need for monthly requests.   Immunization due -     Flu vaccine HIGH DOSE PF(Fluzone Trivalent)  Other orders -     Solifenacin  Succinate; Take 1 tablet (10 mg total) by mouth daily.  Dispense: 90 tablet; Refill: 1     Return in about 3 months (around 05/30/2024) for pain, oab, htn.    Frances MARLA Slain, MD

## 2024-02-28 NOTE — Assessment & Plan Note (Signed)
-   Reports partial improvement with half-tablet of Vesicare . Symptoms are improved but not fully controlled. - Increase Vesicare  to one full tablet daily. New prescription will be sent to [facility].

## 2024-02-28 NOTE — Patient Instructions (Addendum)
 It was nice to see you today,  We addressed the following topics today: - You can increase your Vesicare  to a full tablet daily. We will send the new prescription to your pharmacy today. - your blood pressure was better the second time.  I will not change your blood pressure medication today.  - I will write your oxycodone  prescription with refills going forward so you do not have to request it each month.  Have a great day,  Rolan Slain, MD

## 2024-02-28 NOTE — Assessment & Plan Note (Signed)
-   Patient is stable on current oxycodone  regimen. Last refill had a pharmacy routing issue which has since been corrected. - Continue current oxycodone  prescription. Will add refills to the prescription in the future to avoid need for monthly requests.

## 2024-02-28 NOTE — Assessment & Plan Note (Signed)
-   Controlled on hydrochlorothiazide  and lisinopril  10mg . BP was slightly elevated on initial check today, which may be situational. Recheck was normal.  - continue current tx.

## 2024-03-10 ENCOUNTER — Other Ambulatory Visit: Payer: Self-pay | Admitting: Family Medicine

## 2024-03-10 DIAGNOSIS — F411 Generalized anxiety disorder: Secondary | ICD-10-CM

## 2024-03-15 ENCOUNTER — Other Ambulatory Visit: Payer: Self-pay | Admitting: Family Medicine

## 2024-03-15 MED ORDER — OXYCODONE HCL 15 MG PO TABS: 15.0000 mg | ORAL_TABLET | Freq: Four times a day (QID) | ORAL | 0 refills | Status: DC | PRN

## 2024-03-15 NOTE — Telephone Encounter (Signed)
 Copied from CRM [id number]. Topic: Clinical - Medication Refill >> Mar 15, 2024  1:55 PM Antwanette L wrote: Medication: oxyCODONE  (ROXICODONE ) 15 MG immediate release tablet  Has the patient contacted their pharmacy? Yes   This is the patient's preferred pharmacy:  [facility] - [place], [place] - [address] [place] [address] Phone: [phone] Fax: [phone]  Is this the correct pharmacy for this prescription? Yes   Has the prescription been filled recently? Yes. Last refill was on 02/18/24  Is the patient out of the medication? No. The patient has enough medicine til 03/18/24  Has the patient been seen for an appointment in the last year OR does the patient have an upcoming appointment? Yes. Last ov with Dr. Chandra was on 02/28/24 and next appt is 05/30/24  Can we respond through MyChart? No. Contact the patient by phone at [phone]  Agent: Please be advised that Rx refills may take up to 3 business days. We ask that you follow-up with your pharmacy.

## 2024-03-20 ENCOUNTER — Telehealth: Payer: Self-pay

## 2024-03-20 NOTE — Telephone Encounter (Signed)
 Copied from CRM #[id number]. Topic: Clinical - Prescription Issue >> Mar 20, 2024 10:27 AM Jasmin G wrote: Reason for CRM: Pt called to check on the status of her oxyCODONE  (ROXICODONE ) 15 MG immediate release tablet, I relayed info but pt states that her pharmacy has not gotten the refill, pt requested for refill to be placed ASAP.

## 2024-03-22 ENCOUNTER — Other Ambulatory Visit: Payer: Self-pay | Admitting: Family Medicine

## 2024-03-22 DIAGNOSIS — E785 Hyperlipidemia, unspecified: Secondary | ICD-10-CM

## 2024-03-22 NOTE — Telephone Encounter (Signed)
 From my end it looks like it was filled on the 17th. Can you confirm that with her

## 2024-03-22 NOTE — Telephone Encounter (Signed)
 Tried calling to confirm with patient - medication request refill.

## 2024-04-13 NOTE — Progress Notes (Signed)
 Collen  ASLI TOKARSKI                                          MRN: [id number]   04/13/2024   The [facility] Quality Team Specialist reviewed this patient medical record for the purposes of chart review for care gap closure. The following were reviewed: chart review for care gap closure-breast cancer screening.    [facility] Quality Team

## 2024-04-17 ENCOUNTER — Other Ambulatory Visit: Payer: Self-pay | Admitting: Family Medicine

## 2024-04-17 NOTE — Telephone Encounter (Unsigned)
 Copied from CRM #[id number]. Topic: Clinical - Medication Refill >> Apr 17, 2024  2:49 PM Delon T wrote: Medication: oxyCODONE  (ROXICODONE ) 15 MG immediate release tablet  Has the patient contacted their pharmacy? Yes (Agent: If no, request that the patient contact the pharmacy for the refill. If patient does not wish to contact the pharmacy document the reason why and proceed with request.) (Agent: If yes, when and what did the pharmacy advise?)  This is the patient's preferred pharmacy:  [facility] - [place], [place] - [address] [place] [address] Phone: [phone] Fax: [phone]  Is this the correct pharmacy for this prescription? Yes If no, delete pharmacy and type the correct one.   Has the prescription been filled recently? Yes  Is the patient out of the medication? Yes  Has the patient been seen for an appointment in the last year OR does the patient have an upcoming appointment? Yes  Can we respond through MyChart? No  Agent: Please be advised that Rx refills may take up to 3 business days. We ask that you follow-up with your pharmacy.

## 2024-04-18 MED ORDER — OXYCODONE HCL 15 MG PO TABS: 15.0000 mg | ORAL_TABLET | Freq: Four times a day (QID) | ORAL | 0 refills | Status: DC | PRN

## 2024-05-12 ENCOUNTER — Other Ambulatory Visit: Payer: Self-pay | Admitting: Family Medicine

## 2024-05-12 DIAGNOSIS — I10 Essential (primary) hypertension: Secondary | ICD-10-CM

## 2024-05-12 DIAGNOSIS — J4520 Mild intermittent asthma, uncomplicated: Secondary | ICD-10-CM

## 2024-05-15 ENCOUNTER — Other Ambulatory Visit: Payer: Self-pay | Admitting: Family Medicine

## 2024-05-15 NOTE — Telephone Encounter (Unsigned)
 Copied from CRM #[id number]. Topic: Clinical - Medication Refill >> May 15, 2024  4:34 PM Nathanel BROCKS wrote: Medication: oxyCODONE  (ROXICODONE ) 15 MG immediate release tablet   Has the patient contacted their pharmacy? No   This is the patient's preferred pharmacy:  [facility] - [place], [place] - [address] [place] [address] Phone: [phone] Fax: [phone]  Is this the correct pharmacy for this prescription? Yes If no, delete pharmacy and type the correct one.   Has the prescription been filled recently? Yes  Is the patient out of the medication? Yes  Has the patient been seen for an appointment in the last year OR does the patient have an upcoming appointment? Yes  Can we respond through MyChart? No  Agent: Please be advised that Rx refills may take up to 3 business days. We ask that you follow-up with your pharmacy.

## 2024-05-16 MED ORDER — OXYCODONE HCL 15 MG PO TABS: 15.0000 mg | ORAL_TABLET | Freq: Four times a day (QID) | ORAL | 0 refills | Status: AC | PRN

## 2024-05-30 ENCOUNTER — Ambulatory Visit: Admitting: Family Medicine

## 2024-06-27 ENCOUNTER — Ambulatory Visit: Admitting: Family Medicine
